# Patient Record
Sex: Male | Born: 1975 | Race: Black or African American | Hispanic: No | Marital: Married | Smoking: Former smoker
Health system: Southern US, Community
[De-identification: ages and names within clinical notes are randomized; demographics above are authoritative.]

## PROBLEM LIST (undated history)

## (undated) DIAGNOSIS — M1990 Unspecified osteoarthritis, unspecified site: Secondary | ICD-10-CM

## (undated) DIAGNOSIS — K589 Irritable bowel syndrome without diarrhea: Secondary | ICD-10-CM

## (undated) DIAGNOSIS — I10 Essential (primary) hypertension: Secondary | ICD-10-CM

## (undated) DIAGNOSIS — R42 Dizziness and giddiness: Secondary | ICD-10-CM

## (undated) DIAGNOSIS — K5792 Diverticulitis of intestine, part unspecified, without perforation or abscess without bleeding: Secondary | ICD-10-CM

## (undated) DIAGNOSIS — Z973 Presence of spectacles and contact lenses: Secondary | ICD-10-CM

## (undated) DIAGNOSIS — N200 Calculus of kidney: Secondary | ICD-10-CM

## (undated) HISTORY — PX: KIDNEY STONE SURGERY: SHX686

## (undated) HISTORY — PX: INGUINAL HERNIA REPAIR: SUR1180

---

## 2004-06-16 ENCOUNTER — Emergency Department: Payer: Self-pay | Admitting: Emergency Medicine

## 2006-11-30 ENCOUNTER — Emergency Department: Payer: Self-pay | Admitting: Emergency Medicine

## 2007-08-17 ENCOUNTER — Emergency Department: Payer: Self-pay | Admitting: Emergency Medicine

## 2009-03-10 ENCOUNTER — Emergency Department: Payer: Self-pay | Admitting: Emergency Medicine

## 2009-05-27 ENCOUNTER — Emergency Department: Payer: Self-pay | Admitting: Emergency Medicine

## 2009-09-21 ENCOUNTER — Emergency Department: Payer: Self-pay | Admitting: Emergency Medicine

## 2011-10-13 ENCOUNTER — Emergency Department: Payer: Self-pay | Admitting: *Deleted

## 2011-10-13 LAB — URINALYSIS, COMPLETE
Bilirubin,UR: NEGATIVE
Ketone: NEGATIVE
Ph: 6 (ref 4.5–8.0)
RBC,UR: 704 /HPF (ref 0–5)
Specific Gravity: 1.02 (ref 1.003–1.030)
Squamous Epithelial: <1
WBC UR: 8 /HPF (ref 0–5)

## 2011-10-13 LAB — CBC
HGB: 14.4 g/dL (ref 13.0–18.0)
MCHC: 32.7 g/dL (ref 32.0–36.0)
MCV: 87 fL (ref 80–100)
RBC: 5.09 10*6/uL (ref 4.40–5.90)
RDW: 13.6 % (ref 11.5–14.5)
WBC: 9 10*3/uL (ref 3.8–10.6)

## 2011-10-13 LAB — BASIC METABOLIC PANEL
Anion Gap: 9 (ref 7–16)
BUN: 16 mg/dL (ref 7–18)
Calcium, Total: 9.1 mg/dL (ref 8.5–10.1)
Chloride: 103 mmol/L (ref 98–107)
Co2: 28 mmol/L (ref 21–32)
Creatinine: 1.13 mg/dL (ref 0.60–1.30)
EGFR (African American): >60
Glucose: 111 mg/dL — ABNORMAL HIGH (ref 65–99)
Osmolality: 281 (ref 275–301)
Potassium: 4.2 mmol/L (ref 3.5–5.1)

## 2011-10-23 ENCOUNTER — Ambulatory Visit: Payer: Self-pay | Admitting: Urology

## 2011-10-29 ENCOUNTER — Ambulatory Visit: Payer: Self-pay | Admitting: Urology

## 2011-11-01 ENCOUNTER — Emergency Department: Payer: Self-pay | Admitting: Unknown Physician Specialty

## 2011-11-01 LAB — CBC
MCH: 28.9 pg (ref 26.0–34.0)
MCV: 85 fL (ref 80–100)
RBC: 5.19 10*6/uL (ref 4.40–5.90)
WBC: 16.1 10*3/uL — ABNORMAL HIGH (ref 3.8–10.6)

## 2011-11-01 LAB — URINALYSIS, COMPLETE
Bacteria: NONE SEEN
Bilirubin,UR: NEGATIVE
Glucose,UR: NEGATIVE mg/dL (ref 0–75)
Leukocyte Esterase: NEGATIVE
Nitrite: NEGATIVE
Ph: 6 (ref 4.5–8.0)
Specific Gravity: 1.023 (ref 1.003–1.030)
Squamous Epithelial: <1
WBC UR: 4 /HPF (ref 0–5)

## 2011-11-01 LAB — BASIC METABOLIC PANEL
Anion Gap: 10 (ref 7–16)
Calcium, Total: 9.4 mg/dL (ref 8.5–10.1)
Co2: 28 mmol/L (ref 21–32)
EGFR (African American): >60
EGFR (Non-African Amer.): 53 — ABNORMAL LOW
Glucose: 150 mg/dL — ABNORMAL HIGH (ref 65–99)
Osmolality: 277 (ref 275–301)
Potassium: 4.1 mmol/L (ref 3.5–5.1)
Sodium: 136 mmol/L (ref 136–145)

## 2011-11-16 ENCOUNTER — Ambulatory Visit: Payer: Self-pay | Admitting: Urology

## 2011-11-17 ENCOUNTER — Ambulatory Visit: Payer: Self-pay | Admitting: Urology

## 2011-11-17 DIAGNOSIS — I10 Essential (primary) hypertension: Secondary | ICD-10-CM

## 2011-11-17 LAB — POTASSIUM: Potassium: 3.5 mmol/L (ref 3.5–5.1)

## 2011-11-24 ENCOUNTER — Ambulatory Visit: Payer: Self-pay | Admitting: Urology

## 2011-12-08 DIAGNOSIS — N23 Unspecified renal colic: Secondary | ICD-10-CM | POA: Insufficient documentation

## 2011-12-08 DIAGNOSIS — N1330 Unspecified hydronephrosis: Secondary | ICD-10-CM | POA: Insufficient documentation

## 2011-12-08 DIAGNOSIS — N201 Calculus of ureter: Secondary | ICD-10-CM | POA: Insufficient documentation

## 2013-08-15 ENCOUNTER — Emergency Department: Payer: Self-pay | Admitting: Emergency Medicine

## 2013-08-15 LAB — COMPREHENSIVE METABOLIC PANEL
ALBUMIN: 4 g/dL (ref 3.4–5.0)
ALK PHOS: 124 U/L — AB
ALT: 30 U/L (ref 12–78)
AST: 28 U/L (ref 15–37)
Anion Gap: 6 — ABNORMAL LOW (ref 7–16)
BUN: 11 mg/dL (ref 7–18)
Bilirubin,Total: 0.8 mg/dL (ref 0.2–1.0)
CALCIUM: 9.2 mg/dL (ref 8.5–10.1)
CREATININE: 1.17 mg/dL (ref 0.60–1.30)
Chloride: 106 mmol/L (ref 98–107)
Co2: 26 mmol/L (ref 21–32)
EGFR (African American): >60
EGFR (Non-African Amer.): >60
GLUCOSE: 125 mg/dL — AB (ref 65–99)
Osmolality: 277 (ref 275–301)
Potassium: 4 mmol/L (ref 3.5–5.1)
Sodium: 138 mmol/L (ref 136–145)
Total Protein: 8.1 g/dL (ref 6.4–8.2)

## 2013-08-15 LAB — URINALYSIS, COMPLETE
BACTERIA: NONE SEEN
Bilirubin,UR: NEGATIVE
Glucose,UR: NEGATIVE mg/dL (ref 0–75)
Hyaline Cast: 5
Ketone: NEGATIVE
LEUKOCYTE ESTERASE: NEGATIVE
NITRITE: NEGATIVE
PH: 6 (ref 4.5–8.0)
PROTEIN: NEGATIVE
Specific Gravity: 1.02 (ref 1.003–1.030)
Squamous Epithelial: NONE SEEN
WBC UR: <1 /HPF (ref 0–5)

## 2013-08-15 LAB — CBC WITH DIFFERENTIAL/PLATELET
Basophil #: 0.1 10*3/uL (ref 0.0–0.1)
Basophil %: 0.6 %
Eosinophil #: 0.1 10*3/uL (ref 0.0–0.7)
Eosinophil %: 0.5 %
HCT: 44.6 % (ref 40.0–52.0)
HGB: 14.8 g/dL (ref 13.0–18.0)
LYMPHS ABS: 2.2 10*3/uL (ref 1.0–3.6)
Lymphocyte %: 17.3 %
MCH: 28.2 pg (ref 26.0–34.0)
MCHC: 33.1 g/dL (ref 32.0–36.0)
MCV: 85 fL (ref 80–100)
Monocyte #: 0.7 x10 3/mm (ref 0.2–1.0)
Monocyte %: 5.6 %
NEUTROS ABS: 9.6 10*3/uL — AB (ref 1.4–6.5)
NEUTROS PCT: 76 %
Platelet: 237 10*3/uL (ref 150–440)
RBC: 5.23 10*6/uL (ref 4.40–5.90)
RDW: 14.1 % (ref 11.5–14.5)
WBC: 12.6 10*3/uL — AB (ref 3.8–10.6)

## 2013-08-22 ENCOUNTER — Emergency Department: Payer: Self-pay | Admitting: Emergency Medicine

## 2013-08-22 LAB — CBC WITH DIFFERENTIAL/PLATELET
BASOS ABS: 0.2 10*3/uL — AB (ref 0.0–0.1)
BASOS PCT: 1.5 %
Eosinophil #: 0.1 10*3/uL (ref 0.0–0.7)
Eosinophil %: 0.8 %
HCT: 52.5 % — AB (ref 40.0–52.0)
HGB: 17.5 g/dL (ref 13.0–18.0)
LYMPHS PCT: 25.4 %
Lymphocyte #: 3.8 10*3/uL — ABNORMAL HIGH (ref 1.0–3.6)
MCH: 28.5 pg (ref 26.0–34.0)
MCHC: 33.3 g/dL (ref 32.0–36.0)
MCV: 86 fL (ref 80–100)
Monocyte #: 1.2 x10 3/mm — ABNORMAL HIGH (ref 0.2–1.0)
Monocyte %: 7.8 %
Neutrophil #: 9.5 10*3/uL — ABNORMAL HIGH (ref 1.4–6.5)
Neutrophil %: 64.5 %
Platelet: 370 10*3/uL (ref 150–440)
RBC: 6.13 10*6/uL — ABNORMAL HIGH (ref 4.40–5.90)
RDW: 13.6 % (ref 11.5–14.5)
WBC: 14.7 10*3/uL — AB (ref 3.8–10.6)

## 2013-08-22 LAB — COMPREHENSIVE METABOLIC PANEL
ALT: 26 U/L (ref 12–78)
ANION GAP: 7 (ref 7–16)
Albumin: 4.4 g/dL (ref 3.4–5.0)
Alkaline Phosphatase: 118 U/L — ABNORMAL HIGH
BUN: 23 mg/dL — ABNORMAL HIGH (ref 7–18)
Bilirubin,Total: 1.1 mg/dL — ABNORMAL HIGH (ref 0.2–1.0)
CHLORIDE: 97 mmol/L — AB (ref 98–107)
Calcium, Total: 9.9 mg/dL (ref 8.5–10.1)
Co2: 27 mmol/L (ref 21–32)
Creatinine: 1 mg/dL (ref 0.60–1.30)
EGFR (African American): >60
EGFR (Non-African Amer.): >60
Glucose: 128 mg/dL — ABNORMAL HIGH (ref 65–99)
OSMOLALITY: 268 (ref 275–301)
POTASSIUM: 3.3 mmol/L — AB (ref 3.5–5.1)
SGOT(AST): 16 U/L (ref 15–37)
SODIUM: 131 mmol/L — AB (ref 136–145)
TOTAL PROTEIN: 9.4 g/dL — AB (ref 6.4–8.2)

## 2013-08-22 LAB — URINALYSIS, COMPLETE
BILIRUBIN, UR: NEGATIVE
Bacteria: NONE SEEN
Glucose,UR: NEGATIVE mg/dL (ref 0–75)
Hyaline Cast: 18
Leukocyte Esterase: NEGATIVE
Nitrite: NEGATIVE
Ph: 5 (ref 4.5–8.0)
Protein: NEGATIVE
RBC,UR: 1 /HPF (ref 0–5)
Specific Gravity: 1.027 (ref 1.003–1.030)
Squamous Epithelial: NONE SEEN
WBC UR: 1 /HPF (ref 0–5)

## 2013-08-22 LAB — LIPASE, BLOOD: Lipase: 88 U/L (ref 73–393)

## 2013-08-31 ENCOUNTER — Inpatient Hospital Stay: Payer: Self-pay | Admitting: Internal Medicine

## 2013-08-31 LAB — CBC WITH DIFFERENTIAL/PLATELET
BASOS PCT: 2 %
Basophil #: 0.2 10*3/uL — ABNORMAL HIGH (ref 0.0–0.1)
Eosinophil #: 0.1 10*3/uL (ref 0.0–0.7)
Eosinophil %: 0.6 %
HCT: 48.1 % (ref 40.0–52.0)
HGB: 16.2 g/dL (ref 13.0–18.0)
LYMPHS ABS: 3 10*3/uL (ref 1.0–3.6)
Lymphocyte %: 25.3 %
MCH: 28.4 pg (ref 26.0–34.0)
MCHC: 33.7 g/dL (ref 32.0–36.0)
MCV: 84 fL (ref 80–100)
MONO ABS: 1 x10 3/mm (ref 0.2–1.0)
Monocyte %: 8.2 %
NEUTROS ABS: 7.6 10*3/uL — AB (ref 1.4–6.5)
NEUTROS PCT: 63.9 %
Platelet: 380 10*3/uL (ref 150–440)
RBC: 5.73 10*6/uL (ref 4.40–5.90)
RDW: 13 % (ref 11.5–14.5)
WBC: 12 10*3/uL — AB (ref 3.8–10.6)

## 2013-08-31 LAB — URINALYSIS, COMPLETE
BILIRUBIN, UR: NEGATIVE
Bacteria: NONE SEEN
Blood: NEGATIVE
GLUCOSE, UR: NEGATIVE mg/dL (ref 0–75)
Hyaline Cast: 48
Leukocyte Esterase: NEGATIVE
NITRITE: NEGATIVE
Ph: 5 (ref 4.5–8.0)
Protein: 30
RBC,UR: 2 /HPF (ref 0–5)
Specific Gravity: 1.017 (ref 1.003–1.030)
Squamous Epithelial: <1

## 2013-08-31 LAB — COMPREHENSIVE METABOLIC PANEL
ALBUMIN: 4.2 g/dL (ref 3.4–5.0)
ALK PHOS: 105 U/L
Anion Gap: 12 (ref 7–16)
BUN: 32 mg/dL — AB (ref 7–18)
Bilirubin,Total: 1.4 mg/dL — ABNORMAL HIGH (ref 0.2–1.0)
CALCIUM: 9.8 mg/dL (ref 8.5–10.1)
CREATININE: 2.19 mg/dL — AB (ref 0.60–1.30)
Chloride: 90 mmol/L — ABNORMAL LOW (ref 98–107)
Co2: 27 mmol/L (ref 21–32)
EGFR (African American): 43 — ABNORMAL LOW
EGFR (Non-African Amer.): 37 — ABNORMAL LOW
Glucose: 131 mg/dL — ABNORMAL HIGH (ref 65–99)
OSMOLALITY: 268 (ref 275–301)
POTASSIUM: 3.1 mmol/L — AB (ref 3.5–5.1)
SGOT(AST): 19 U/L (ref 15–37)
SGPT (ALT): 40 U/L (ref 12–78)
Sodium: 129 mmol/L — ABNORMAL LOW (ref 136–145)
Total Protein: 8.1 g/dL (ref 6.4–8.2)

## 2013-08-31 LAB — LIPASE, BLOOD: Lipase: 122 U/L (ref 73–393)

## 2013-08-31 LAB — MAGNESIUM: Magnesium: 2.4 mg/dL

## 2013-09-01 LAB — CBC WITH DIFFERENTIAL/PLATELET
BASOS ABS: 0.1 10*3/uL (ref 0.0–0.1)
BASOS PCT: 0.7 %
Eosinophil #: 0.1 10*3/uL (ref 0.0–0.7)
Eosinophil %: 1.1 %
HCT: 39.3 % — ABNORMAL LOW (ref 40.0–52.0)
HGB: 13.3 g/dL (ref 13.0–18.0)
LYMPHS ABS: 4.2 10*3/uL — AB (ref 1.0–3.6)
LYMPHS PCT: 35.8 %
MCH: 28.7 pg (ref 26.0–34.0)
MCHC: 33.8 g/dL (ref 32.0–36.0)
MCV: 85 fL (ref 80–100)
Monocyte #: 1 x10 3/mm (ref 0.2–1.0)
Monocyte %: 8.8 %
NEUTROS PCT: 53.6 %
Neutrophil #: 6.3 10*3/uL (ref 1.4–6.5)
Platelet: 288 10*3/uL (ref 150–440)
RBC: 4.64 10*6/uL (ref 4.40–5.90)
RDW: 12.9 % (ref 11.5–14.5)
WBC: 11.7 10*3/uL — ABNORMAL HIGH (ref 3.8–10.6)

## 2013-09-01 LAB — BASIC METABOLIC PANEL
Anion Gap: 7 (ref 7–16)
BUN: 28 mg/dL — ABNORMAL HIGH (ref 7–18)
CREATININE: 1.52 mg/dL — AB (ref 0.60–1.30)
Calcium, Total: 8.4 mg/dL — ABNORMAL LOW (ref 8.5–10.1)
Chloride: 97 mmol/L — ABNORMAL LOW (ref 98–107)
Co2: 28 mmol/L (ref 21–32)
GFR CALC NON AF AMER: 58 — AB
Glucose: 88 mg/dL (ref 65–99)
OSMOLALITY: 269 (ref 275–301)
POTASSIUM: 3 mmol/L — AB (ref 3.5–5.1)
SODIUM: 132 mmol/L — AB (ref 136–145)

## 2013-09-01 LAB — TSH: Thyroid Stimulating Horm: 1.32 u[IU]/mL

## 2013-09-02 LAB — CBC WITH DIFFERENTIAL/PLATELET
Basophil #: 0.1 10*3/uL (ref 0.0–0.1)
Basophil %: 1.1 %
EOS ABS: 0.1 10*3/uL (ref 0.0–0.7)
EOS PCT: 1.1 %
HCT: 35.8 % — ABNORMAL LOW (ref 40.0–52.0)
HGB: 11.9 g/dL — AB (ref 13.0–18.0)
LYMPHS PCT: 38.3 %
Lymphocyte #: 3.4 10*3/uL (ref 1.0–3.6)
MCH: 28.2 pg (ref 26.0–34.0)
MCHC: 33.3 g/dL (ref 32.0–36.0)
MCV: 85 fL (ref 80–100)
MONO ABS: 0.7 x10 3/mm (ref 0.2–1.0)
Monocyte %: 7.5 %
NEUTROS ABS: 4.6 10*3/uL (ref 1.4–6.5)
Neutrophil %: 52 %
PLATELETS: 282 10*3/uL (ref 150–440)
RBC: 4.23 10*6/uL — AB (ref 4.40–5.90)
RDW: 12.8 % (ref 11.5–14.5)
WBC: 8.9 10*3/uL (ref 3.8–10.6)

## 2013-09-02 LAB — BASIC METABOLIC PANEL
Anion Gap: 4 — ABNORMAL LOW (ref 7–16)
BUN: 18 mg/dL (ref 7–18)
CHLORIDE: 109 mmol/L — AB (ref 98–107)
CREATININE: 1.11 mg/dL (ref 0.60–1.30)
Calcium, Total: 8.4 mg/dL — ABNORMAL LOW (ref 8.5–10.1)
Co2: 24 mmol/L (ref 21–32)
EGFR (African American): >60
Glucose: 71 mg/dL (ref 65–99)
Osmolality: 274 (ref 275–301)
Potassium: 3.6 mmol/L (ref 3.5–5.1)
SODIUM: 137 mmol/L (ref 136–145)

## 2013-09-03 LAB — COMPREHENSIVE METABOLIC PANEL
ALBUMIN: 3.1 g/dL — AB (ref 3.4–5.0)
ALT: 30 U/L (ref 12–78)
AST: 13 U/L — AB (ref 15–37)
Alkaline Phosphatase: 71 U/L
Anion Gap: 6 — ABNORMAL LOW (ref 7–16)
BILIRUBIN TOTAL: 0.9 mg/dL (ref 0.2–1.0)
BUN: 8 mg/dL (ref 7–18)
Calcium, Total: 8.3 mg/dL — ABNORMAL LOW (ref 8.5–10.1)
Chloride: 104 mmol/L (ref 98–107)
Co2: 26 mmol/L (ref 21–32)
Creatinine: 0.9 mg/dL (ref 0.60–1.30)
EGFR (African American): >60
Glucose: 83 mg/dL (ref 65–99)
Osmolality: 269 (ref 275–301)
Potassium: 3.3 mmol/L — ABNORMAL LOW (ref 3.5–5.1)
Sodium: 136 mmol/L (ref 136–145)
TOTAL PROTEIN: 5.7 g/dL — AB (ref 6.4–8.2)

## 2013-09-03 LAB — CBC WITH DIFFERENTIAL/PLATELET
BASOS ABS: 0.1 10*3/uL (ref 0.0–0.1)
Basophil %: 1 %
EOS PCT: 1.7 %
Eosinophil #: 0.1 10*3/uL (ref 0.0–0.7)
HCT: 36.7 % — ABNORMAL LOW (ref 40.0–52.0)
HGB: 12.3 g/dL — ABNORMAL LOW (ref 13.0–18.0)
LYMPHS ABS: 3.8 10*3/uL — AB (ref 1.0–3.6)
LYMPHS PCT: 49.9 %
MCH: 28.5 pg (ref 26.0–34.0)
MCHC: 33.4 g/dL (ref 32.0–36.0)
MCV: 85 fL (ref 80–100)
MONO ABS: 0.5 x10 3/mm (ref 0.2–1.0)
Monocyte %: 7.2 %
Neutrophil #: 3.1 10*3/uL (ref 1.4–6.5)
Neutrophil %: 40.2 %
PLATELETS: 285 10*3/uL (ref 150–440)
RBC: 4.3 10*6/uL — AB (ref 4.40–5.90)
RDW: 13 % (ref 11.5–14.5)
WBC: 7.6 10*3/uL (ref 3.8–10.6)

## 2013-09-04 LAB — CBC WITH DIFFERENTIAL/PLATELET
BASOS ABS: 0.1 10*3/uL (ref 0.0–0.1)
Basophil %: 0.9 %
EOS PCT: 2 %
Eosinophil #: 0.2 10*3/uL (ref 0.0–0.7)
HCT: 36 % — ABNORMAL LOW (ref 40.0–52.0)
HGB: 11.9 g/dL — ABNORMAL LOW (ref 13.0–18.0)
Lymphocyte #: 3.7 10*3/uL — ABNORMAL HIGH (ref 1.0–3.6)
Lymphocyte %: 45.3 %
MCH: 28.1 pg (ref 26.0–34.0)
MCHC: 33.2 g/dL (ref 32.0–36.0)
MCV: 85 fL (ref 80–100)
MONOS PCT: 6.9 %
Monocyte #: 0.6 x10 3/mm (ref 0.2–1.0)
NEUTROS ABS: 3.7 10*3/uL (ref 1.4–6.5)
NEUTROS PCT: 44.9 %
Platelet: 285 10*3/uL (ref 150–440)
RBC: 4.24 10*6/uL — ABNORMAL LOW (ref 4.40–5.90)
RDW: 12.8 % (ref 11.5–14.5)
WBC: 8.2 10*3/uL (ref 3.8–10.6)

## 2013-09-04 LAB — BASIC METABOLIC PANEL
ANION GAP: 6 — AB (ref 7–16)
BUN: 9 mg/dL (ref 7–18)
CALCIUM: 8.5 mg/dL (ref 8.5–10.1)
CHLORIDE: 107 mmol/L (ref 98–107)
CREATININE: 1.04 mg/dL (ref 0.60–1.30)
Co2: 26 mmol/L (ref 21–32)
EGFR (African American): >60
EGFR (Non-African Amer.): >60
Glucose: 119 mg/dL — ABNORMAL HIGH (ref 65–99)
Osmolality: 277 (ref 275–301)
POTASSIUM: 3.6 mmol/L (ref 3.5–5.1)
SODIUM: 139 mmol/L (ref 136–145)

## 2013-11-13 ENCOUNTER — Emergency Department: Payer: Self-pay | Admitting: Student

## 2014-04-02 ENCOUNTER — Emergency Department: Payer: Self-pay | Admitting: Emergency Medicine

## 2014-04-02 LAB — COMPREHENSIVE METABOLIC PANEL
AST: 25 U/L (ref 15–37)
Albumin: 4.1 g/dL (ref 3.4–5.0)
Alkaline Phosphatase: 128 U/L — ABNORMAL HIGH
Anion Gap: 6 — ABNORMAL LOW (ref 7–16)
BUN: 15 mg/dL (ref 7–18)
Bilirubin,Total: 1 mg/dL (ref 0.2–1.0)
Calcium, Total: 9.3 mg/dL (ref 8.5–10.1)
Chloride: 102 mmol/L (ref 98–107)
Co2: 26 mmol/L (ref 21–32)
Creatinine: 1.13 mg/dL (ref 0.60–1.30)
EGFR (African American): >60
EGFR (Non-African Amer.): >60
Glucose: 98 mg/dL (ref 65–99)
Osmolality: 269 (ref 275–301)
POTASSIUM: 3.6 mmol/L (ref 3.5–5.1)
SGPT (ALT): 37 U/L
Sodium: 134 mmol/L — ABNORMAL LOW (ref 136–145)
Total Protein: 8.6 g/dL — ABNORMAL HIGH (ref 6.4–8.2)

## 2014-04-02 LAB — CBC WITH DIFFERENTIAL/PLATELET
BASOS PCT: 1 %
Basophil #: 0.1 10*3/uL (ref 0.0–0.1)
EOS ABS: 0.1 10*3/uL (ref 0.0–0.7)
Eosinophil %: 0.6 %
HCT: 47.2 % (ref 40.0–52.0)
HGB: 15.5 g/dL (ref 13.0–18.0)
Lymphocyte #: 3.2 10*3/uL (ref 1.0–3.6)
Lymphocyte %: 32 %
MCH: 28.4 pg (ref 26.0–34.0)
MCHC: 32.8 g/dL (ref 32.0–36.0)
MCV: 87 fL (ref 80–100)
Monocyte #: 0.6 x10 3/mm (ref 0.2–1.0)
Monocyte %: 6.2 %
NEUTROS ABS: 6 10*3/uL (ref 1.4–6.5)
Neutrophil %: 60.2 %
PLATELETS: 257 10*3/uL (ref 150–440)
RBC: 5.45 10*6/uL (ref 4.40–5.90)
RDW: 13.7 % (ref 11.5–14.5)
WBC: 9.9 10*3/uL (ref 3.8–10.6)

## 2014-04-02 LAB — URINALYSIS, COMPLETE
BILIRUBIN, UR: NEGATIVE
Bacteria: NONE SEEN
Blood: NEGATIVE
Glucose,UR: NEGATIVE mg/dL (ref 0–75)
Ketone: NEGATIVE
Leukocyte Esterase: NEGATIVE
Nitrite: NEGATIVE
Ph: 6 (ref 4.5–8.0)
Protein: NEGATIVE
Specific Gravity: 1.018 (ref 1.003–1.030)
Squamous Epithelial: NONE SEEN

## 2014-07-23 ENCOUNTER — Emergency Department
Admit: 2014-07-23 | Disposition: A | Discharge status: Home / Self Care | Payer: Self-pay | Admitting: Emergency Medicine

## 2014-07-23 LAB — CBC WITH DIFFERENTIAL/PLATELET
BASOS PCT: 0.8 %
Basophil #: 0.2 10*3/uL — ABNORMAL HIGH (ref 0.0–0.1)
EOS ABS: 0 10*3/uL (ref 0.0–0.7)
Eosinophil %: 0 %
HCT: 46.3 % (ref 40.0–52.0)
HGB: 15 g/dL (ref 13.0–18.0)
LYMPHS ABS: 0.9 10*3/uL — AB (ref 1.0–3.6)
Lymphocyte %: 4.1 %
MCH: 28.3 pg (ref 26.0–34.0)
MCHC: 32.5 g/dL (ref 32.0–36.0)
MCV: 87 fL (ref 80–100)
MONO ABS: 0.8 x10 3/mm (ref 0.2–1.0)
Monocyte %: 3.6 %
NEUTROS ABS: 19.6 10*3/uL — AB (ref 1.4–6.5)
Neutrophil %: 91.5 %
Platelet: 226 10*3/uL (ref 150–440)
RBC: 5.31 10*6/uL (ref 4.40–5.90)
RDW: 14.4 % (ref 11.5–14.5)
WBC: 21.4 10*3/uL — ABNORMAL HIGH (ref 3.8–10.6)

## 2014-07-23 LAB — COMPREHENSIVE METABOLIC PANEL
ALBUMIN: 4.5 g/dL
ALK PHOS: 83 U/L
ALT: 31 U/L
AST: 29 U/L
Anion Gap: 11 (ref 7–16)
BUN: 18 mg/dL
Bilirubin,Total: 1.5 mg/dL — ABNORMAL HIGH
CALCIUM: 8.9 mg/dL
Chloride: 101 mmol/L
Co2: 24 mmol/L
Creatinine: 0.93 mg/dL
EGFR (African American): >60
EGFR (Non-African Amer.): >60
Glucose: 173 mg/dL — ABNORMAL HIGH
Potassium: 4.1 mmol/L
Sodium: 136 mmol/L
Total Protein: 7.9 g/dL

## 2014-07-23 LAB — URINALYSIS, COMPLETE
Bacteria: NONE SEEN
Bilirubin,UR: NEGATIVE
Glucose,UR: 50 mg/dL (ref 0–75)
KETONE: NEGATIVE
Leukocyte Esterase: NEGATIVE
Nitrite: NEGATIVE
PH: 6 (ref 4.5–8.0)
Protein: 30
Specific Gravity: 1.026 (ref 1.003–1.030)

## 2014-07-23 LAB — LIPASE, BLOOD: Lipase: 34 U/L

## 2014-07-24 NOTE — Op Note (Signed)
PATIENT NAME:  Edward Harris, Edward Harris MR#:  [id number] DATE OF BIRTH:  07-06-1975  DATE OF PROCEDURE:  11/24/2011  PREOPERATIVE DIAGNOSIS: Left ureterolithiasis.   POSTOPERATIVE DIAGNOSIS: Left ureterolithiasis.   PROCEDURE PERFORMED: Left ureteroscopy with holmium laser lithotripsy.   SURGEON: Denice Bors. Jacqlyn Larsen, MD   ANESTHESIA: General endotracheal anesthesia.   INDICATIONS: The patient is a 39 year old African American gentleman who presented recently with a left proximal ureteral calculus. He underwent subsequent ESWL. There was failure of fragmentation or progression of the stone. He presents for ureteroscopic stone removal.   PROCEDURE: After informed consent was obtained, the patient was taken to the operating room and placed in the dorsal lithotomy position under general endotracheal anesthesia. The patient was then prepped and draped in the usual standard fashion. The 95 French rigid cystoscope was introduced into the urethra under direct vision with no urethral abnormalities noted. Upon entering the prostatic fossa, minimal bilobar hypertrophy was appreciated. Upon entering the bladder, the mucosa was inspected in its entirety with no gross mucosal lesions noted. Bilateral ureteral orifices were well visualized with no lesions noted. A flexible tip Glidewire was introduced into the left ureteral orifice. It was advanced into the upper pole collecting system under fluoroscopic guidance without difficulty. The cystoscope was removed. The 6 French rigid ureteroscope was advanced into the urinary bladder. A second guidewire was utilized to help facilitate navigation of the scope into the ureteral orifice. The scope was advanced to the level of the stone in the proximal ureter. The stone was then fragmented with the holmium laser. The stone was noted to be significantly hardened requiring increased energy and prolonged fragmentation for adequate breakage. Once the stone was adequately fragmented, the  basket was utilized for removal. Multiple passes were required for complete removal of an approximate 6 mm proximal ureteral calculus. The scope was then advanced into the level of the renal pelvis with no additional abnormalities appreciated. The decision was made not to place a stent. The ureteroscope was removed. The cystoscope was replaced back into the urinary bladder. The stone fragments were irrigated free. These were collected and will be sent for stone analysis. The bladder was then drained. The cystoscope was removed. The patient was returned to the supine position and awakened from laryngeal mask airway anesthesia. He was taken to the recovery room in stable condition. There were no problems or complications. The patient tolerated the procedure well.    ____________________________ Denice Bors. Jacqlyn Larsen, MD bsc:drc D: 11/24/2011 19:02:57 ET T: 11/25/2011 08:12:08 ET JOB#: [id number]  cc: Denice Bors. Jacqlyn Larsen, MD, <Dictator> Denice Bors Luken Shadowens MD ELECTRONICALLY SIGNED 11/26/2011 9:00

## 2014-07-28 NOTE — Discharge Summary (Signed)
Dates of Admission and Diagnosis:  Date of Admission 31-Aug-2013   Date of Discharge 01-Jan-0001   Admitting Diagnosis vomiting, dehydration, hypotension, acute renal failure, hypertension   Final Diagnosis vomiting, dehydration, hypotension, acute renal failure, hypertension   Discharge Diagnosis 1 hypertension    Chief Complaint/History of Present Illness Young male presented to ED with nausea and vomiting. Patient was found to be dehydrated and hypotensive. ED work up revealed and acute renal failure.   Allergies:  No Known Allergies:   Routine Chem:  28-May-15 14:33   BUN  32  Creatinine (comp)  2.19  Potassium, Serum  3.1  29-May-15 04:37   BUN  28  Creatinine (comp)  1.52  Potassium, Serum  3.0  30-May-15 04:23   BUN 18  Creatinine (comp) 1.11  Potassium, Serum 3.6  31-May-15 04:44   BUN 8  Creatinine (comp) 0.90  Potassium, Serum  3.3  01-Jun-15 04:29   Glucose, Serum  119  BUN 9  Creatinine (comp) 1.04  Sodium, Serum 139  Potassium, Serum 3.6  Chloride, Serum 107  CO2, Serum 26  Calcium (Total), Serum 8.5  Anion Gap  6  Osmolality (calc) 277  eGFR (African American) >60  eGFR (Non-African American) >60 (eGFR values <39mL/min/1.73 m2 may be an indication of chronic kidney disease (CKD). Calculated eGFR is useful in patients with stable renal function. The eGFR calculation will not be reliable in acutely ill patients when serum creatinine is changing rapidly. It is not useful in  patients on dialysis. The eGFR calculation may not be applicable to patients at the low and high extremes of body sizes, pregnant women, and vegetarians.)  Routine UA:  28-May-15 21:10   Color (UA) Amber  Clarity (UA) Cloudy  Glucose (UA) Negative  Bilirubin (UA) Negative  Ketones (UA) 1+  Specific Gravity (UA) 1.017  Blood (UA) Negative  pH (UA) 5.0  Protein (UA) 30 mg/dL  Nitrite (UA) Negative  Leukocyte Esterase (UA) Negative (Result(s) reported on 31 Aug 2013 at  09:50PM.)  RBC (UA) 2 /HPF  WBC (UA) 3 /HPF  Bacteria (UA) NONE SEEN  Epithelial Cells (UA) <1 /HPF  Mucous (UA) PRESENT  Hyaline Cast (UA) 48 /LPF (Result(s) reported on 31 Aug 2013 at 09:50PM.)  Routine Hem:  28-May-15 14:33   WBC (CBC)  12.0  Hemoglobin (CBC) 16.2  Hematocrit (CBC) 48.1  29-May-15 04:37   WBC (CBC)  11.7  Hemoglobin (CBC) 13.3  Hematocrit (CBC)  39.3  30-May-15 04:23   WBC (CBC) 8.9  Hemoglobin (CBC)  11.9  Hematocrit (CBC)  35.8  31-May-15 04:44   WBC (CBC) 7.6  Hemoglobin (CBC)  12.3  Hematocrit (CBC)  36.7  01-Jun-15 04:29   WBC (CBC) 8.2  RBC (CBC)  4.24  Hemoglobin (CBC)  11.9  Hematocrit (CBC)  36.0  Platelet Count (CBC) 285  MCV 85  MCH 28.1  MCHC 33.2  RDW 12.8  Neutrophil % 44.9  Lymphocyte % 45.3  Monocyte % 6.9  Eosinophil % 2.0  Basophil % 0.9  Neutrophil # 3.7  Lymphocyte #  3.7  Monocyte # 0.6  Eosinophil # 0.2  Basophil # 0.1 (Result(s) reported on 04 Sep 2013 at 05:09AM.)   PERTINENT RADIOLOGY STUDIES: XRay:    12-May-15 11:26, KUB - Kidney Ureter Bladder  KUB - Kidney Ureter Bladder   REASON FOR EXAM:    decrease BM and LLQ abd pain  COMMENTS:   LMP: (Male)    PROCEDURE: DXR - DXR KIDNEY URETER BLADDER  -  Aug 15 2013 11:26AM     CLINICAL DATA:  Two week history of left lower quadrant abdominal  pain    EXAM:  ABDOMEN - 1 VIEW    COMPARISON:  Prior KUB 11/16/2011    FINDINGS:  The bowel gas pattern is normal. No radio-opaque calculi or other  significant radiographic abnormality are seen. Surgical changes of  prior right inguinal hernia repair.     IMPRESSION:  Negative.      Electronically Signed    By: Jacqulynn Cadet M.D.    On: 08/15/2013 11:44         Verified By: Criselda Peaches, M.D.,  CT:    12-May-15 13:32, CT Abdomen and Pelvis With Contrast  CT Abdomen and Pelvis With Contrast   REASON FOR EXAM:    (1) LLQ abad pain with elevate WBC; (2) LLQ pain  COMMENTS:   LMP:  (Male)    PROCEDURE: CT  - CT ABDOMEN / PELVIS  W  - Aug 15 2013  1:32PM     CLINICAL DATA:  One week history of abdominal pain with vomiting  beginning today the patient is still complaining of left lower  quadrant abdominal discomfort and has an elevated white blood cell  count.    EXAM:  CT ABDOMEN AND PELVIS WITH CONTRAST    TECHNIQUE:  Multidetector CT imaging of the abdomen and pelvis was performed  usingthe standard protocol following bolus administration of  intravenous contrast.    CONTRAST:  125 cc of Isovue 370 intravenously. The patient also  received oral contrast material    COMPARISON:  DG ABDOMEN 1V dated 08/15/2013    FINDINGS:  The liver exhibits no focal mass nor ductal dilation. Mildly  decreased density within the liver may reflect fatty infiltrative  change. The gallbladder is adequately distended without definite  evidence of stones. No pericholecystic inflammatory change is  demonstrated. The pancreas, spleen, moderately distended stomach,  duodenum, adrenal glands, and kidneys are normal in appearance.  The jejunum and ileum demonstrate a normal appearance. Contrast has  reached the distal small bowel but has not yet reachedthe colon.  The colon appears incompletely rotated with the majority of the  colonic loops lying to the left of midline. A normal calibered,  gas-filled appendix is demonstrated. There is an abnormal appearance  of the descending colon and proximal rectosigmoid in a fashion  consistent with acute diverticulitis. There is no evidence of  perforation or abscess formation. Microperforation cannot be  absolutely excluded. Increased density in the pericolonic fat is  demonstrated. More distally the sigmoid colon and rectum appear  normal.    The urinary bladder and prostate gland appear normal. The patient  has undergone previous right inguinal hernia repair. There is a  small left inguinal hernia containing fat. There is no  umbilical  hernia. Thepsoas musculature is normal in appearance. The lumbar  vertebral bodies are preserved in height. There is degenerative disc  change at L4-5. The bony pelvis exhibits no acute abnormality. The  lung bases exhibit no acute abnormalities. There may be minimal  atelectasis versus scarring in the inferior aspect of the right  middle lobe.     IMPRESSION:  1. The appearance of the distal descending colon and proximal  rectosigmoid is consistent with acute diverticulitis. There is no  evidence of abscess formation. No free extraluminal gas collections  are demonstrated but microperforation cannot be excluded. The  remainder of the bowel exhibits no acute abnormality.  2. There  is no acute hepatobiliary abnormality nor acute urinary  tract abnormality.  Electronically Signed    By: David  Martinique    On: 08/15/2013 13:51         Verified By: DAVID A. Martinique, M.D., MD   Pertinent Past History:  Pertinent Past History Hypertension   Hospital Course:  [place] male admitted with nausea, vomiting, dehydration, hypotension and acute renal failure. Patient was recently treated for acute diverticulitis on 08/15/2013. Patient received Zofran prn for nausea. Nausea and Vomiting gradually improved and resolved. Dehydration and hypotension improved with i.v fluids. Acute renal failure, likely pre-renal and ATN, creatinine improved to within normal limits. Hypokalemia was corrected with potassium supplement. Mild leukocytosis on initial labs. Repeat WBC count was normal prior to discharge. HTN: advised to resume usual anti-hypertensive medications at discharge.   Condition on Discharge Stable   Code Status:  Code Status Full Code   DISCHARGE INSTRUCTIONS HOME MEDS:  Medication Reconciliation: Patient's Home Medications at Discharge:     Medication Instructions  ondansetron 4 mg oral tablet, disintegrating  1-2 tab(s) orally 3 times a day, As Needed - for Nausea,  Vomiting   hydrochlorothiazide-losartan 25 mg-100 mg oral tablet  1 tab(s) orally once a day   prilosec 20 mg oral delayed release capsule  1 cap(s) orally once a day    PRESCRIPTIONS: ELECTRONICALLY SUBMITTED   Physician's Instructions:  Diet Low Sodium  Low Fat, Low Cholesterol   Activity Limitations As tolerated   Return to Work on 09/06/2013   Time frame for Follow Up Appointment 1-2 weeks  Dr. Loyal Buba, III, John B(Family Physician): [facility], [address] [place], [place] [address], [place] [phone]  Electronic Signatures: Glendon Axe (MD)  (Signed 01-Jun-15 13:45)  Authored: ADMISSION DATE AND DIAGNOSIS, CHIEF COMPLAINT/HPI, Allergies, PERTINENT LABS, PERTINENT RADIOLOGY STUDIES, PERTINENT PAST HISTORY, HOSPITAL COURSE, [place], PATIENT INSTRUCTIONS, Follow Up Physician   Last Updated: 01-Jun-15 13:45 by Glendon Axe (MD)

## 2014-07-28 NOTE — H&P (Signed)
PATIENT NAME:  KHRISTIAN, PHILLIPPI MR#:  [id number] DATE OF BIRTH:  03/02/1976  DATE OF ADMISSION:  08/31/2013  PRIMARY CARE PHYSICIAN: Dr. Lisette Grinder.   REFERRING PHYSICIAN: Dr. Hinda Kehr.   CHIEF COMPLAINT: Nausea, vomiting.   HISTORY OF PRESENT ILLNESS: Mr. Trebilcock is a 39 year old male with a history of hypertension. Came to the Emergency Department on May 12th with complaints of left lower quadrant pain. The patient underwent a CT scan of the abdomen and pelvis and was found to have diverticulitis. The patient was given Cipro and Flagyl. The patient took about 5 days of antibiotics. Since the day of diagnosis, the patient started to have nausea, vomiting, unable to keep down any diet. The patient has been having nausea and vomiting all of these 2 weeks. After a week, the patient went to his primary care physician who gave him Phenergan; however, despite taking this Phenergan, the patient continued to have nausea and vomiting. Came back to the Emergency Department when the patient was told to start back the antibiotics and was also given Zofran without much improvement. The patient had a scheduled appointment with gastrointestinal disease. When the patient went to the GI clinic, the patient was found to have systolic blood pressure in the 70s. Concerning this, the patient was sent to the Emergency Department for further evaluation. Workup in the Emergency Department: The patient was found to have acute renal failure with a BUN of 32, creatinine of 2.19, potassium of 3.1. The patient states usually vomits 2 to 3 hours after eating. From the time of eating, the patient experiences severe nausea. Usually vomits what he has eaten, with improvement of the nausea after vomiting. Denies having any abdominal pain. Denies having any fever.   PAST MEDICAL HISTORY: Hypertension.   PAST SURGICAL HISTORY:  1. Kidney stones.  2. Hernia repair.   ALLERGIES: No known drug allergies.   HOME MEDICATIONS:   1. Prilosec 20 mg once a day.  2. Zofran 4 mg 3 times a day.  3. Losartan/hydrochlorothiazide 25/100 mg once a day.   SOCIAL HISTORY: Former smoker, quit about 3 years back. Denies drinking alcohol or using illicit drugs. Married, lives with his wife. Works as a Conservation officer, nature.    FAMILY HISTORY: Hypertension in mother.   REVIEW OF SYSTEMS:  CONSTITUTIONAL: Experiences generalized weakness.  EYES: No change in vision.  ENT: No change in hearing.  RESPIRATORY: No cough, shortness of breath.  CARDIOVASCULAR: No chest pain, palpations.  GASTROINTESTINAL: Has nausea and vomiting. Has been experiencing some diarrhea since has taken MiraLAX.  GENITOURINARY: No dysuria or hematuria.  HEMATOLOGIC: No easy bruising or bleeding.  SKIN: No rash or lesions.  MUSCULOSKELETAL: No joint pains and aches.  ENDOCRINE: No polyuria or polydipsia.   PHYSICAL EXAMINATION:  GENERAL: This is a well-built, well-nourished, age-appropriate male lying down in the bed, not in distress.  VITAL SIGNS: Temperature 98.7, pulse 85, blood pressure 121/63, respiratory rate of 14, oxygen saturations 100% on room air.  HEENT: Head normocephalic, atraumatic. There is no scleral icterus. Conjunctivae normal. Pupils equal and react to light. Extraocular movements are intact. Mucous membranes: Mild dryness. No pharyngeal erythema.  NECK: Supple. No lymphadenopathy. No JVD. No carotid bruit. No thyromegaly.  CHEST: Has no focal tenderness.  LUNGS: Bilaterally clear to auscultation.  HEART: S1, S2 regular. No murmurs are heard.  ABDOMEN: Bowel sounds present. Soft, nontender, nondistended. No hepatosplenomegaly.  EXTREMITIES: No pedal edema. Pulses 2+.  SKIN: No rash or lesions.  MUSCULOSKELETAL:  Good range of motion in all of the extremities.  NEUROLOGIC: The patient is alert, oriented to place, person, and time. Cranial nerves II through XII intact. Motor and sensory .    LABORATORIES: CMP shows BUN 32,  creatinine of 2.19, potassium of 3.1. Total bilirubin 1.4.   CBC is completely within normal limits. Magnesium of 2.4.   ASSESSMENT AND PLAN: Mr. Gronau is a 39 year old male who comes to the Emergency Department. Was found to have low blood pressure.  1. Hypotension secondary to dehydration as well as the patient continued to take high blood pressure medications hydrochlorothiazide and losartan. Continue with intravenous fluids. The patient's blood pressure improved to systolic blood pressure to 120s.  2. Nausea and vomiting: This seems to be more from the gastric cause. Keep the patient n.p.o. Continue with antinausea medications. The patient does not have any tenderness consistent with gastritis. The patient does not have any elevated LFTs. Does not have any tenderness in any part of the body. Continue the Protonix and follow up.  3. Acute renal failure: Combination of the acute tubular necrosis and prerenal. Continue with intravenous fluids and follow up.  4. Diverticulitis: The patient does not have any tenderness. Will hold all antibiotics for now.  5. History of hypertension: Hold all blood pressure medications for now.  6. Keep the patient on deep vein thrombosis prophylaxis with Lovenox.   TIME SPENT: 50 minutes.    ____________________________ Monica Becton, MD pv:gb D: 08/31/2013 22:59:19 ET T: 09/01/2013 01:14:18 ET JOB#: [id number]  cc: Monica Becton, MD, <Dictator> John B. Sarina Ser, MD Grier Mitts Shirline Kendle MD ELECTRONICALLY SIGNED 09/04/2013 23:21

## 2014-08-19 ENCOUNTER — Emergency Department: Payer: Managed Care, Other (non HMO)

## 2014-08-19 ENCOUNTER — Encounter: Payer: Self-pay | Admitting: Emergency Medicine

## 2014-08-19 ENCOUNTER — Inpatient Hospital Stay
Admission: EM | Admit: 2014-08-19 | Discharge: 2014-08-22 | DRG: 392 | Disposition: A | Discharge status: Home / Self Care | Payer: Managed Care, Other (non HMO) | Attending: Internal Medicine | Admitting: Internal Medicine

## 2014-08-19 DIAGNOSIS — R198 Other specified symptoms and signs involving the digestive system and abdomen: Secondary | ICD-10-CM

## 2014-08-19 DIAGNOSIS — Z87442 Personal history of urinary calculi: Secondary | ICD-10-CM

## 2014-08-19 DIAGNOSIS — R1110 Vomiting, unspecified: Secondary | ICD-10-CM

## 2014-08-19 DIAGNOSIS — Z888 Allergy status to other drugs, medicaments and biological substances status: Secondary | ICD-10-CM

## 2014-08-19 DIAGNOSIS — K56609 Unspecified intestinal obstruction, unspecified as to partial versus complete obstruction: Secondary | ICD-10-CM | POA: Insufficient documentation

## 2014-08-19 DIAGNOSIS — K589 Irritable bowel syndrome without diarrhea: Secondary | ICD-10-CM | POA: Diagnosis present

## 2014-08-19 DIAGNOSIS — K567 Ileus, unspecified: Secondary | ICD-10-CM | POA: Diagnosis not present

## 2014-08-19 DIAGNOSIS — K5732 Diverticulitis of large intestine without perforation or abscess without bleeding: Secondary | ICD-10-CM | POA: Diagnosis not present

## 2014-08-19 DIAGNOSIS — I10 Essential (primary) hypertension: Secondary | ICD-10-CM | POA: Diagnosis present

## 2014-08-19 DIAGNOSIS — Z87891 Personal history of nicotine dependence: Secondary | ICD-10-CM

## 2014-08-19 DIAGNOSIS — K5792 Diverticulitis of intestine, part unspecified, without perforation or abscess without bleeding: Secondary | ICD-10-CM | POA: Diagnosis present

## 2014-08-19 HISTORY — DX: Essential (primary) hypertension: I10

## 2014-08-19 HISTORY — DX: Diverticulitis of intestine, part unspecified, without perforation or abscess without bleeding: K57.92

## 2014-08-19 HISTORY — DX: Calculus of kidney: N20.0

## 2014-08-19 HISTORY — DX: Irritable bowel syndrome, unspecified: K58.9

## 2014-08-19 LAB — CBC WITH DIFFERENTIAL/PLATELET
BASOS ABS: 0 10*3/uL (ref 0–0.1)
Basophils Relative: 1 %
EOS ABS: 0 10*3/uL (ref 0–0.7)
Eosinophils Relative: 0 %
HEMATOCRIT: 48.4 % (ref 40.0–52.0)
Hemoglobin: 15.6 g/dL (ref 13.0–18.0)
Lymphocytes Relative: 16 %
Lymphs Abs: 1.5 10*3/uL (ref 1.0–3.6)
MCH: 28.6 pg (ref 26.0–34.0)
MCHC: 32.1 g/dL (ref 32.0–36.0)
MCV: 89.1 fL (ref 80.0–100.0)
MONO ABS: 0.4 10*3/uL (ref 0.2–1.0)
MONOS PCT: 4 %
NEUTROS PCT: 79 %
Neutro Abs: 7.6 10*3/uL — ABNORMAL HIGH (ref 1.4–6.5)
Platelets: 185 10*3/uL (ref 150–440)
RBC: 5.44 MIL/uL (ref 4.40–5.90)
RDW: 13.4 % (ref 11.5–14.5)
WBC: 9.6 10*3/uL (ref 3.8–10.6)

## 2014-08-19 LAB — COMPREHENSIVE METABOLIC PANEL
ALT: 25 U/L (ref 17–63)
ANION GAP: 9 (ref 5–15)
AST: 20 U/L (ref 15–41)
Albumin: 4.2 g/dL (ref 3.5–5.0)
Alkaline Phosphatase: 75 U/L (ref 38–126)
BILIRUBIN TOTAL: 1.2 mg/dL (ref 0.3–1.2)
BUN: 10 mg/dL (ref 6–20)
CALCIUM: 8.7 mg/dL — AB (ref 8.9–10.3)
CHLORIDE: 104 mmol/L (ref 101–111)
CO2: 25 mmol/L (ref 22–32)
Creatinine, Ser: 0.76 mg/dL (ref 0.61–1.24)
GFR calc Af Amer: >60 mL/min (ref >60)
GFR calc non Af Amer: >60 mL/min (ref >60)
GLUCOSE: 112 mg/dL — AB (ref 65–99)
POTASSIUM: 3.9 mmol/L (ref 3.5–5.1)
SODIUM: 138 mmol/L (ref 135–145)
TOTAL PROTEIN: 7.5 g/dL (ref 6.5–8.1)

## 2014-08-19 LAB — LIPASE, BLOOD: Lipase: 38 U/L (ref 22–51)

## 2014-08-19 LAB — URINALYSIS COMPLETE WITH MICROSCOPIC (ARMC ONLY)
BACTERIA UA: NONE SEEN
BILIRUBIN URINE: NEGATIVE
Glucose, UA: NEGATIVE mg/dL
LEUKOCYTES UA: NEGATIVE
Nitrite: NEGATIVE
PROTEIN: NEGATIVE mg/dL
SPECIFIC GRAVITY, URINE: 1.01 (ref 1.005–1.030)
SQUAMOUS EPITHELIAL / LPF: NONE SEEN
pH: 8 (ref 5.0–8.0)

## 2014-08-19 MED ORDER — PROMETHAZINE HCL 25 MG/ML IJ SOLN
25.0000 mg | Freq: Once | INTRAMUSCULAR | Status: AC
  Administered 2014-08-19: 25 mg via INTRAVENOUS

## 2014-08-19 MED ORDER — ONDANSETRON HCL 4 MG/2ML IJ SOLN
4.0000 mg | Freq: Once | INTRAMUSCULAR | Status: AC
  Administered 2014-08-19: 4 mg via INTRAVENOUS

## 2014-08-19 MED ORDER — IOHEXOL 240 MG/ML SOLN
25.0000 mL | Freq: Once | INTRAMUSCULAR | Status: AC | PRN
  Administered 2014-08-19: 25 mL via ORAL

## 2014-08-19 MED ORDER — MORPHINE SULFATE 4 MG/ML IJ SOLN
4.0000 mg | Freq: Once | INTRAMUSCULAR | Status: AC
  Administered 2014-08-19: 4 mg via INTRAVENOUS

## 2014-08-19 MED ORDER — CIPROFLOXACIN IN D5W 400 MG/200ML IV SOLN
INTRAVENOUS | Status: AC
  Administered 2014-08-19: 400 mg via INTRAVENOUS
  Filled 2014-08-19: qty 200

## 2014-08-19 MED ORDER — METOCLOPRAMIDE HCL 5 MG/ML IJ SOLN
10.0000 mg | Freq: Once | INTRAMUSCULAR | Status: AC
  Administered 2014-08-19: 10 mg via INTRAVENOUS

## 2014-08-19 MED ORDER — MORPHINE SULFATE 4 MG/ML IJ SOLN
INTRAMUSCULAR | Status: AC
  Administered 2014-08-19: 4 mg via INTRAVENOUS
  Filled 2014-08-19: qty 1

## 2014-08-19 MED ORDER — SODIUM CHLORIDE 0.9 % IV SOLN: Freq: Once | INTRAVENOUS | Status: DC

## 2014-08-19 MED ORDER — METRONIDAZOLE IN NACL 5-0.79 MG/ML-% IV SOLN
500.0000 mg | Freq: Once | INTRAVENOUS | Status: AC
  Administered 2014-08-19: 500 mg via INTRAVENOUS

## 2014-08-19 MED ORDER — ONDANSETRON HCL 4 MG/2ML IJ SOLN
INTRAMUSCULAR | Status: AC
  Filled 2014-08-19: qty 2

## 2014-08-19 MED ORDER — SODIUM CHLORIDE 0.9 % IV BOLUS (SEPSIS)
1000.0000 mL | Freq: Once | INTRAVENOUS | Status: AC
  Administered 2014-08-19: 1000 mL via INTRAVENOUS

## 2014-08-19 MED ORDER — CIPROFLOXACIN IN D5W 400 MG/200ML IV SOLN
400.0000 mg | Freq: Once | INTRAVENOUS | Status: AC
  Administered 2014-08-19: 400 mg via INTRAVENOUS

## 2014-08-19 MED ORDER — PROMETHAZINE HCL 25 MG/ML IJ SOLN
INTRAMUSCULAR | Status: AC
Start: 2014-08-19 — End: 2014-08-19
  Administered 2014-08-19: 25 mg via INTRAVENOUS
  Filled 2014-08-19: qty 1

## 2014-08-19 MED ORDER — IOHEXOL 300 MG/ML  SOLN
100.0000 mL | Freq: Once | INTRAMUSCULAR | Status: AC | PRN
  Administered 2014-08-19: 100 mL via INTRAVENOUS

## 2014-08-19 MED ORDER — METRONIDAZOLE IN NACL 5-0.79 MG/ML-% IV SOLN
INTRAVENOUS | Status: AC
  Administered 2014-08-19: 500 mg via INTRAVENOUS
  Filled 2014-08-19: qty 100

## 2014-08-19 MED ORDER — METOCLOPRAMIDE HCL 5 MG/ML IJ SOLN
INTRAMUSCULAR | Status: AC
Start: 2014-08-19 — End: 2014-08-19
  Administered 2014-08-19: 10 mg via INTRAVENOUS
  Filled 2014-08-19: qty 2

## 2014-08-19 NOTE — ED Notes (Signed)
Notified by lab of need for redraw. Lab work obtained from IV. Discussed need for urine sample with patient. Patient denies need to void at this time. Encouraged patient to notify nurse when able to provide sample. Patient verbalized understanding. Patient reports relief from pain medicine but c/o nausea. ED MD notified.

## 2014-08-19 NOTE — ED Notes (Signed)
Contacted lab regarding pending labs. Lab tech agreed to look into it.

## 2014-08-19 NOTE — ED Notes (Signed)
C/o LLQ abd. Pain with n,v since Saturday, not able to have BM since Friday, hx of diverticulitis

## 2014-08-19 NOTE — ED Provider Notes (Signed)
[facility] Emergency Department Provider Note  Time seen: 8:40 PM  I have reviewed the triage vital signs and the nursing notes.   HISTORY  Chief Complaint Abdominal Pain; Nausea; and Emesis    HPI CORRADO HYMON is a 39 y.o. male with a past medical history of diverticulosis, diverticulitis, hypertension, diabetes who presents to the emergency department with left lower quadrant abdominal cramping, nausea and vomiting. According to the patient he has been having these symptoms for approximately 3 days. Describes the cramping as moderate in intensity mostly located in the left lower quadrant. Patient was recently diagnosed with diverticulitis 1 month ago requiring a brief hospitalization. He has a colonoscopy scheduled in 2-3 weeks.Patient states this feels similar to his past diverticulitis flare. Denies fever. Positive nausea and vomiting 3 days getting worse patient unable to tolerate liquids today. Last bowel movement approximately 2 days ago.     Past Medical History  Diagnosis Date  . Diverticulitis   . Hypertension     There are no active problems to display for this patient.   No past surgical history on file.  Current Outpatient Rx  Name  Route  Sig  Dispense  Refill  . losartan-hydrochlorothiazide (HYZAAR) 100-25 MG per tablet   Oral   Take 1 tablet by mouth daily.      3   . predniSONE (STERAPRED UNI-PAK 21 TAB) 10 MG (21) TBPK tablet      6 day taper pack           Allergies Review of patient's allergies indicates no known allergies.  No family history on file.  Social History History  Substance Use Topics  . Smoking status: Never Smoker   . Smokeless tobacco: Not on file  . Alcohol Use: Yes     Comment: "occassional"    Review of Systems Constitutional: Negative for fever. Cardiovascular: Negative for chest pain. Respiratory: Negative for shortness of breath. Gastrointestinal: Positive for left lower quadrant  abdominal pain, nausea, vomiting. Negative for diarrhea. Constipation 2-3 days. Genitourinary: Negative for dysuria.  10-point ROS otherwise negative.  ____________________________________________   PHYSICAL EXAM:  VITAL SIGNS: ED Triage Vitals  Enc Vitals Group     BP 08/19/14 1504 138/75 mmHg     Pulse Rate 08/19/14 1504 90     Resp 08/19/14 1504 18     Temp 08/19/14 1504 98.6 F (37 C)     Temp Source 08/19/14 1504 Oral     SpO2 08/19/14 1504 100 %     Weight 08/19/14 1504 220 lb (99.791 kg)     Height 08/19/14 1504 5\' 9"  (1.753 m)     Head Cir --      Peak Flow --      Pain Score 08/19/14 1505 10     Pain Loc --      Pain Edu? --      Excl. in [place]? --     Constitutional: Alert and oriented. Well appearing and in no distress. ENT   Mouth/Throat: Mucous membranes are moist. Cardiovascular: Normal rate, regular rhythm. No murmurs, rubs, or gallops. Respiratory: Normal respiratory effort without tachypnea nor retractions. Breath sounds are clear  Gastrointestinal: Soft, mild left lower quadrant tenderness to palpation. No rebound or guarding Musculoskeletal: Nontender with normal range of motion in all extremities. Neurologic:  Normal speech and language. No gross focal neurologic deficits Skin:  Skin is warm, dry and intact.  Psychiatric: Mood and affect are normal. Speech and behavior are normal.  ____________________________________________    RADIOLOGY  CT scan consistent with sigmoid diverticulitis with an ileus.  ____________________________________________    INITIAL IMPRESSION / ASSESSMENT AND PLAN / ED COURSE  Pertinent labs & imaging results that were available during my care of the patient were reviewed by me and considered in my medical decision making (see chart for details).  Patient left lower quadrant abdominal pain, no white blood cell count elevation. CT scan is consistent with sigmoid diverticulitis as well as an ileus. Patient  attempting to by mouth hydrate although becoming very nauseated unfortunately. If patient nothing by mouth tolerate well we'll discharge home with medication if not will admit for IV antibiotics until the patient can tolerate by mouth liquids.  ----------------------------------------- 8:54 PM on 08/19/2014 -----------------------------------------  Patient unable to tolerate liquids due to nausea/vomiting. Will admit for IV abx. ____________________________________________   FINAL CLINICAL IMPRESSION(S) / ED DIAGNOSES  Diverticulitis Ileus   Harvest Dark, MD 08/19/14 2055

## 2014-08-19 NOTE — H&P (Signed)
[facility] [place] NAME: Edward Harris    MR#:  [id number]  DATE OF BIRTH:  [birth year]  DATE OF ADMISSION:  08/19/2014  PRIMARY CARE PHYSICIAN: Madelyn Brunner, MD   REQUESTING/REFERRING PHYSICIAN: Paduchowski  CHIEF COMPLAINT:   Chief Complaint  Patient presents with  . Abdominal Pain  . Nausea  . Emesis    HISTORY OF PRESENT ILLNESS:  Edward Harris  is a 39 y.o. male who presents with diverticulitis and ileus.  3 days N/V, 1 day pain, no blood.  PAST MEDICAL HISTORY:   Past Medical History  Diagnosis Date  . Diverticulitis   . Hypertension   . IBS (irritable bowel syndrome)   . Nephrolithiasis     PAST SURGICAL HISTORY:   Past Surgical History  Procedure Laterality Date  . Inguinal hernia repair Right   . Kidney stone surgery      extraction    SOCIAL HISTORY:   History  Substance Use Topics  . Smoking status: Former Research scientist (life sciences)  . Smokeless tobacco: Not on file  . Alcohol Use: 0.0 oz/week    0 Standard drinks or equivalent per week     Comment: "occassional"    FAMILY HISTORY:   Family History  Problem Relation Age of Onset  . Hypertension Mother   . Osteoarthritis Mother   . Heart attack Father   . Hypertension Brother   . Alzheimer's disease Maternal Aunt   . Lung cancer      DRUG ALLERGIES:   Allergies  Allergen Reactions  . Amlodipine     Sore arms    MEDICATIONS AT HOME:   Prior to Admission medications   Medication Sig Start Date End Date Taking? Authorizing Provider  losartan-hydrochlorothiazide (HYZAAR) 100-25 MG per tablet Take 1 tablet by mouth daily. 07/14/14  Yes Historical Provider, MD  predniSONE (STERAPRED UNI-PAK 21 TAB) 10 MG (21) TBPK tablet 6 day taper pack 08/03/14  Yes Historical Provider, MD    REVIEW OF SYSTEMS:  Review of Systems  Constitutional: Negative for fever, chills, weight loss and malaise/fatigue.  HENT: Negative for ear pain, hearing loss and  tinnitus.   Eyes: Negative for blurred vision, double vision, pain and redness.  Respiratory: Negative for cough, hemoptysis and shortness of breath.   Cardiovascular: Negative for chest pain, palpitations, orthopnea and leg swelling.  Gastrointestinal: Positive for nausea, vomiting, abdominal pain and constipation. Negative for diarrhea.  Genitourinary: Negative for dysuria, frequency and hematuria.  Musculoskeletal: Negative for back pain, joint pain and neck pain.  Skin:       No acne, rash, or lesions  Neurological: Negative for dizziness, tremors, focal weakness and weakness.  Endo/Heme/Allergies: Negative for polydipsia. Does not bruise/bleed easily.  Psychiatric/Behavioral: Negative for depression. The patient is not nervous/anxious and does not have insomnia.      VITAL SIGNS:   Filed Vitals:   08/19/14 1800 08/19/14 1830 08/19/14 2008 08/19/14 2125  BP: 168/96 160/94 157/91 144/94  Pulse: 79 78 83 79  Temp:      TempSrc:      Resp:   15 20  Height:      Weight:      SpO2: 94% 97% 98% 99%   Wt Readings from Last 3 Encounters:  08/19/14 99.791 kg (220 lb)    PHYSICAL EXAMINATION:  Physical Exam  Constitutional: He is oriented to person, place, and time. He appears well-developed and well-nourished. No distress.  HENT:  Head: Normocephalic and  atraumatic.  Mouth/Throat: Oropharynx is clear and moist.  Eyes: Conjunctivae and EOM are normal. Pupils are equal, round, and reactive to light. No scleral icterus.  Neck: Normal range of motion. Neck supple. No JVD present. No thyromegaly present.  Cardiovascular: Normal rate, regular rhythm and intact distal pulses.  Exam reveals no gallop and no friction rub.   No murmur heard. Respiratory: Effort normal and breath sounds normal. No respiratory distress. He has no wheezes. He has no rales.  GI: Soft. He exhibits no distension. There is tenderness (Left lower quadrant greater than right upper quadrant).  Hypoactive  accelerating in bowel sounds  Musculoskeletal: Normal range of motion. He exhibits no edema.  No arthritis, no gout  Lymphadenopathy:    He has no cervical adenopathy.  Neurological: He is alert and oriented to person, place, and time. No cranial nerve deficit.  No dysarthria, no aphasia  Skin: Skin is warm and dry. No rash noted. No erythema.  Psychiatric: He has a normal mood and affect. His behavior is normal. Judgment and thought content normal.    LABORATORY PANEL:   CBC  Recent Labs Lab 08/19/14 1508  WBC 9.6  HGB 15.6  HCT 48.4  PLT 185   ------------------------------------------------------------------------------------------------------------------  Chemistries   Recent Labs Lab 08/19/14 1751  NA 138  K 3.9  CL 104  CO2 25  GLUCOSE 112*  BUN 10  CREATININE 0.76  CALCIUM 8.7*  AST 20  ALT 25  ALKPHOS 75  BILITOT 1.2   ------------------------------------------------------------------------------------------------------------------  Cardiac Enzymes No results for input(s): TROPONINI in the last 168 hours. ------------------------------------------------------------------------------------------------------------------  RADIOLOGY:  Ct Abdomen Pelvis W Contrast  08/19/2014   CLINICAL DATA:  Left lower quadrant abdominal pain with nausea and vomiting starting on Saturday.  EXAM: CT ABDOMEN AND PELVIS WITH CONTRAST  TECHNIQUE: Multidetector CT imaging of the abdomen and pelvis was performed using the standard protocol following bolus administration of intravenous contrast.  CONTRAST:  139mL OMNIPAQUE IOHEXOL 300 MG/ML  SOLN  COMPARISON:  07/23/2014  FINDINGS: Lower chest: Dependent subsegmental atelectasis in both lower lobes. Mild chronic scarring in the right middle lobe.  Hepatobiliary: 6 mm hypodense lesion in segment 6 of the liver, image 36 series 2, and similar to 08/15/2013, technically too small to characterize although statistically likely to be  benign. I  Pancreas: Unremarkable  Spleen: Unremarkable  Adrenals/Urinary Tract: Slight nodularity along the medial limb left adrenal gland but no overt mass. Punctate calcification in the upper anterior urinary bladder wall on image 78 of series 2 without observed mass.  Stomach/Bowel: Mobile cecum extending into the left pelvis with normal appendix. Diverticulosis involving the transverse, descending, and proximal sigmoid colon.  In the left lower quadrant at the junction of the descending and sigmoid colon there is abnormal wall thickening, surrounding mild mesenteric adenopathy, and abnormal stranding. This is the same location as previous although the size of adjacent lymph nodes is increased. Small ring-like area of density along the omental fat in this area on image 63 of series 2 favors secondary inflammation over appendagitis epiploica.  Mildly dilated proximal loops of jejunum, possibly from localized ileus, containing air-fluid levels.  Vascular/Lymphatic: Small right common iliac lymph nodes are present. Right external iliac node 0.8 cm in short axis, image 79 series 2. One of the lymph nodes adjacent to the inflammatory stranding at the junction of the descending and sigmoid colon measures 0.9 cm in short axis on image 65 series 2.  Reproductive: Unremarkable  Other: Interposed between the  right seminal vesicle and the rectum, there is a centrally calcified 1.3 by 0.8 cm structure which is unchanged from 10/14/2011 and accordingly probably a chronically calcified postinflammatory lymph node.  Musculoskeletal: A left inguinal hernia contains adipose tissue.  Bony bridging of the right sacroiliac joint.  Disc osteophyte complex at L4-5 and L5-S1.  IMPRESSION: 1. Once again there is abnormal wall thickening at the junction of the descending and sigmoid colon, with mild increase in the size of the adjacent lymph nodes compared to the prior apparent flare up from last month. Differential diagnostic  considerations include a small during/recurrent acute diverticulitis, although a colon mass at this location with secondary inflammatory findings is not completely excluded. This may warrant a follow up colonoscopy at the resolution of the patient's acute symptoms. 2. Dilated proximal jejunum with air-fluid levels, probably due to secondary ileus. 3. Considerable diverticulosis of the transverse, descending, and proximal sigmoid colon. 4. Left inguinal hernia contains adipose tissue. 5. 6 mm hypodense lesion in segment 6 of the liver, not appreciably changed from 1 year ago, technically too small to characterize although statistically likely to be benign.   Electronically Signed   By: Van Clines M.D.   On: 08/19/2014 20:24    EKG:  No orders found for this or any previous visit.  IMPRESSION AND PLAN:  Principal Problem:   Diverticulitis - this is recurrent as the patient recently had an episode of the same. At that time he states he was admitted for several days due to inability to take by mouth due to nausea and vomiting. He was to be very similar course to what he is describing this admission. We'll continue IV antibiotics for now until he is tolerating by mouth. Active Problems:   Ileus - possibly secondary to abdominal inflammation from the diverticulitis. No obstruction seen on CT. Keep nothing by mouth for now, IV meds for nausea and vomiting, monitor and advance diet as tolerated.   Hypertension - hold home by mouth meds for now as patient is nothing by mouth, use IV when necessary medications to control his blood pressure.   All the records are reviewed and case discussed with ED provider. Management plans discussed with the patient and/or family.  DVT PROPHYLAXIS: SubQ lovenox  ADMISSION STATUS: Observation  CODE STATUS: Full  TOTAL TIME TAKING CARE OF THIS PATIENT: 45 minutes.    Shantil Vallejo [place] 08/19/2014, 10:09 PM  [facility]  Office   [phone]  CC: Primary care physician; Madelyn Brunner, MD

## 2014-08-20 ENCOUNTER — Encounter: Payer: Self-pay | Admitting: Urgent Care

## 2014-08-20 LAB — BASIC METABOLIC PANEL
Anion gap: 6 (ref 5–15)
BUN: 9 mg/dL (ref 6–20)
CO2: 27 mmol/L (ref 22–32)
Calcium: 8.6 mg/dL — ABNORMAL LOW (ref 8.9–10.3)
Chloride: 104 mmol/L (ref 101–111)
Creatinine, Ser: 0.87 mg/dL (ref 0.61–1.24)
GFR calc Af Amer: >60 mL/min (ref >60)
GLUCOSE: 104 mg/dL — AB (ref 65–99)
POTASSIUM: 3.3 mmol/L — AB (ref 3.5–5.1)
Sodium: 137 mmol/L (ref 135–145)

## 2014-08-20 LAB — CBC
HCT: 41.4 % (ref 40.0–52.0)
Hemoglobin: 13.7 g/dL (ref 13.0–18.0)
MCH: 28.8 pg (ref 26.0–34.0)
MCHC: 33.2 g/dL (ref 32.0–36.0)
MCV: 86.6 fL (ref 80.0–100.0)
PLATELETS: 222 10*3/uL (ref 150–440)
RBC: 4.78 MIL/uL (ref 4.40–5.90)
RDW: 13.5 % (ref 11.5–14.5)
WBC: 9 10*3/uL (ref 3.8–10.6)

## 2014-08-20 MED ORDER — ONDANSETRON HCL 4 MG/2ML IJ SOLN
4.0000 mg | Freq: Four times a day (QID) | INTRAMUSCULAR | Status: DC | PRN
  Administered 2014-08-20 – 2014-08-21 (×3): 4 mg via INTRAVENOUS
  Filled 2014-08-20 (×3): qty 2

## 2014-08-20 MED ORDER — LEVOFLOXACIN IN D5W 750 MG/150ML IV SOLN
750.0000 mg | INTRAVENOUS | Status: DC
  Administered 2014-08-20 – 2014-08-22 (×3): 750 mg via INTRAVENOUS
  Filled 2014-08-20 (×4): qty 150

## 2014-08-20 MED ORDER — ENOXAPARIN SODIUM 40 MG/0.4ML ~~LOC~~ SOLN
40.0000 mg | SUBCUTANEOUS | Status: DC
  Administered 2014-08-20 – 2014-08-22 (×3): 40 mg via SUBCUTANEOUS
  Filled 2014-08-20 (×3): qty 0.4

## 2014-08-20 MED ORDER — SODIUM CHLORIDE 0.9 % IV SOLN
INTRAVENOUS | Status: DC
  Administered 2014-08-20 – 2014-08-21 (×3): via INTRAVENOUS

## 2014-08-20 MED ORDER — CIPROFLOXACIN IN D5W 400 MG/200ML IV SOLN
400.0000 mg | Freq: Two times a day (BID) | INTRAVENOUS | Status: DC
  Administered 2014-08-20: 400 mg via INTRAVENOUS
  Filled 2014-08-20 (×3): qty 200

## 2014-08-20 MED ORDER — HYDROCHLOROTHIAZIDE 25 MG PO TABS
25.0000 mg | ORAL_TABLET | Freq: Every day | ORAL | Status: DC
  Administered 2014-08-20 – 2014-08-22 (×3): 25 mg via ORAL
  Filled 2014-08-20 (×3): qty 1

## 2014-08-20 MED ORDER — LOSARTAN POTASSIUM 50 MG PO TABS
100.0000 mg | ORAL_TABLET | Freq: Every day | ORAL | Status: DC
  Administered 2014-08-20 – 2014-08-22 (×3): 100 mg via ORAL
  Filled 2014-08-20 (×3): qty 2

## 2014-08-20 MED ORDER — METRONIDAZOLE IN NACL 5-0.79 MG/ML-% IV SOLN
500.0000 mg | Freq: Three times a day (TID) | INTRAVENOUS | Status: DC
  Administered 2014-08-20 – 2014-08-22 (×7): 500 mg via INTRAVENOUS
  Filled 2014-08-20 (×11): qty 100

## 2014-08-20 MED ORDER — HYDRALAZINE HCL 20 MG/ML IJ SOLN
10.0000 mg | INTRAMUSCULAR | Status: DC | PRN
  Filled 2014-08-20: qty 1

## 2014-08-20 MED ORDER — ONDANSETRON HCL 4 MG PO TABS: 4.0000 mg | ORAL_TABLET | Freq: Four times a day (QID) | ORAL | Status: DC | PRN

## 2014-08-20 MED ORDER — LOSARTAN POTASSIUM-HCTZ 100-25 MG PO TABS: 1.0000 | ORAL_TABLET | Freq: Every day | ORAL | Status: DC

## 2014-08-20 NOTE — Progress Notes (Signed)
[facility] [place] NAME: Edward Harris    MR#:  [id number]  DATE OF BIRTH:  03/20/1976  SUBJECTIVE:  Came in with left sided abdominal pain for 2 days. Was unable to tolerated po diet. Feels much better today. Ok to try CLD  REVIEW OF SYSTEMS:    Review of Systems  Constitutional: Negative for fever, chills and weight loss.  HENT: Negative for ear discharge, ear pain and nosebleeds.   Eyes: Negative for blurred vision, pain and discharge.  Respiratory: Negative for sputum production, shortness of breath, wheezing and stridor.   Cardiovascular: Negative for chest pain, palpitations, orthopnea and PND.  Gastrointestinal: Positive for nausea and abdominal pain. Negative for vomiting and diarrhea.  Genitourinary: Negative for urgency and frequency.  Musculoskeletal: Negative for back pain and joint pain.  Neurological: Negative for sensory change, speech change, focal weakness and weakness.  Psychiatric/Behavioral: Negative for depression and memory loss. The patient is not nervous/anxious.   All other systems reviewed and are negative.    DRUG ALLERGIES:   Allergies  Allergen Reactions  . Amlodipine     Sore arms    VITALS:  Blood pressure 153/97, pulse 79, temperature 99.5 F (37.5 C), temperature source Oral, resp. rate 17, height 5\' 9"  (1.753 m), weight 106.232 kg (234 lb 3.2 oz), SpO2 98 %.  PHYSICAL EXAMINATION:   Physical Exam  GENERAL:  39 y.o.-year-old patient lying in the bed with no acute distress.  EYES: Pupils equal, round, reactive to light and accommodation. No scleral icterus. Extraocular muscles intact.  HEENT: Head atraumatic, normocephalic. Oropharynx and nasopharynx clear.  NECK:  Supple, no jugular venous distention. No thyroid enlargement, no tenderness.  LUNGS: Normal breath sounds bilaterally, no wheezing, rales, rhonchi. No use of accessory muscles of respiration.  CARDIOVASCULAR: S1, S2 normal.  No murmurs, rubs, or gallops.  ABDOMEN: Soft, diffuse tenderness more localized in the left LLQ - nondistended. Bowel sounds present. No organomegaly or mass.  EXTREMITIES: No cyanosis, clubbing or edema b/l.    NEUROLOGIC: Cranial nerves II through XII are intact. No focal Motor or sensory deficits b/l.   PSYCHIATRIC: The patient is alert and oriented x 3.  SKIN: No obvious rash, lesion, or ulcer.    LABORATORY PANEL:   CBC  Recent Labs Lab 08/20/14 0439  WBC 9.0  HGB 13.7  HCT 41.4  PLT 222   ------------------------------------------------------------------------------------------------------------------  Chemistries   Recent Labs Lab 08/19/14 1751 08/20/14 0439  NA 138 137  K 3.9 3.3*  CL 104 104  CO2 25 27  GLUCOSE 112* 104*  BUN 10 9  CREATININE 0.76 0.87  CALCIUM 8.7* 8.6*  AST 20  --   ALT 25  --   ALKPHOS 75  --   BILITOT 1.2  --    ------------------------------------------------------------------------------------------------------------------  Cardiac Enzymes No results for input(s): TROPONINI in the last 168 hours. ------------------------------------------------------------------------------------------------------------------  RADIOLOGY:  Ct Abdomen Pelvis W Contrast  08/19/2014   CLINICAL DATA:  Left lower quadrant abdominal pain with nausea and vomiting starting on Saturday.  EXAM: CT ABDOMEN AND PELVIS WITH CONTRAST  TECHNIQUE: Multidetector CT imaging of the abdomen and pelvis was performed using the standard protocol following bolus administration of intravenous contrast.  CONTRAST:  129mL OMNIPAQUE IOHEXOL 300 MG/ML  SOLN  COMPARISON:  07/23/2014  FINDINGS: Lower chest: Dependent subsegmental atelectasis in both lower lobes. Mild chronic scarring in the right middle lobe.  Hepatobiliary: 6 mm hypodense lesion in segment 6 of  the liver, image 36 series 2, and similar to 08/15/2013, technically too small to characterize although statistically  likely to be benign. I  Pancreas: Unremarkable  Spleen: Unremarkable  Adrenals/Urinary Tract: Slight nodularity along the medial limb left adrenal gland but no overt mass. Punctate calcification in the upper anterior urinary bladder wall on image 78 of series 2 without observed mass.  Stomach/Bowel: Mobile cecum extending into the left pelvis with normal appendix. Diverticulosis involving the transverse, descending, and proximal sigmoid colon.  In the left lower quadrant at the junction of the descending and sigmoid colon there is abnormal wall thickening, surrounding mild mesenteric adenopathy, and abnormal stranding. This is the same location as previous although the size of adjacent lymph nodes is increased. Small ring-like area of density along the omental fat in this area on image 63 of series 2 favors secondary inflammation over appendagitis epiploica.  Mildly dilated proximal loops of jejunum, possibly from localized ileus, containing air-fluid levels.  Vascular/Lymphatic: Small right common iliac lymph nodes are present. Right external iliac node 0.8 cm in short axis, image 79 series 2. One of the lymph nodes adjacent to the inflammatory stranding at the junction of the descending and sigmoid colon measures 0.9 cm in short axis on image 65 series 2.  Reproductive: Unremarkable  Other: Interposed between the right seminal vesicle and the rectum, there is a centrally calcified 1.3 by 0.8 cm structure which is unchanged from 10/14/2011 and accordingly probably a chronically calcified postinflammatory lymph node.  Musculoskeletal: A left inguinal hernia contains adipose tissue.  Bony bridging of the right sacroiliac joint.  Disc osteophyte complex at L4-5 and L5-S1.  IMPRESSION: 1. Once again there is abnormal wall thickening at the junction of the descending and sigmoid colon, with mild increase in the size of the adjacent lymph nodes compared to the prior apparent flare up from last month. Differential  diagnostic considerations include a small during/recurrent acute diverticulitis, although a colon mass at this location with secondary inflammatory findings is not completely excluded. This may warrant a follow up colonoscopy at the resolution of the patient's acute symptoms. 2. Dilated proximal jejunum with air-fluid levels, probably due to secondary ileus. 3. Considerable diverticulosis of the transverse, descending, and proximal sigmoid colon. 4. Left inguinal hernia contains adipose tissue. 5. 6 mm hypodense lesion in segment 6 of the liver, not appreciably changed from 1 year ago, technically too small to characterize although statistically likely to be benign.   Electronically Signed   By: Van Clines M.D.   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  -CLD _IVF -IV cipro +flagyl -GI dr Allen Norris to see pt -recent episode in April 2016 and may 2015 -denies any bloody stools  * Ileus - possibly secondary to abdominal inflammation from the diverticulitis. No obstruction seen on CT. -IV meds for nausea and vomiting, monitor and advance diet as tolerated.  * Hypertension - resume HCTZ-losartan   All the records are reviewed and case discussed with Care Management/Social Workerr. Management plans discussed with the patient, family and they are in agreement.  CODE STATUS:FULL CODE  DVT Prophylaxis: Lovenox  TOTAL TIME TAKING CARE OF THIS PATIENT:35 minutes.   POSSIBLE D/C IN 1-2 DAYS, DEPENDING ON CLINICAL CONDITION.   Colonel Krauser M.D on 08/20/2014 at 1:50 PM  Between 7am to 6pm -  Pager - [phone]  After 6pm go to [email or web address] - password [email or web address] [facility]  Office  [phone]  CC: Primary care physician; Madelyn Brunner, MD

## 2014-08-20 NOTE — Progress Notes (Signed)
Initial Nutrition Assessment  DOCUMENTATION CODES:  INTERVENTION: Meals and Snacks: Cater to patient preferences as able on current diet.   NUTRITION DIAGNOSIS:  Inadequate oral intake related to acute illness as evidenced by per patient/family report.   GOAL: Energy Intake: Patient will meet greater than or equal to 90% of their needs once diet advanced   MONITOR: Electrolyte and Renal Profile Diet advancement  Digestive Profile  REASON FOR ASSESSMENT:  Consult Poor PO  ASSESSMENT: Reason For Admission: diverticulitis PMHx: HTN, IBS Typical Fluid/ Food Intake: Just started on PO diet, CL lunch tray untouched at bedside Meal/ Snack Patterns: Patient reports a good appetite and intake of a regular diet prior to Friday, 5/13 when he began feeling nauseated and started vomiting. Supplements: None  Labs:  Protein Profile:  Recent Labs Lab 08/19/14 1751  ALBUMIN 4.2   Electrolyte and Renal Profile:    Recent Labs Lab 08/19/14 1751 08/20/14 0439  BUN 10 9  CREATININE 0.76 0.87  NA 138 137  K 3.9 3.3*   Meds: Flagyl, NS @ 59ml/hr  UOP:  Intake/Output Summary (Last 24 hours) at 08/20/14 1413 Last data filed at 08/20/14 0400  Gross per 24 hour  Intake    280 ml  Output      0 ml  Net    280 ml     Physical Findings: n/a Weight Changes: Patient reports that he has not lost any significant weight recently. He did lose a significant amount of weight x 1 year ago (pt reports 30#), but he has maintained a stable weight x 1 year.  Height:  Ht Readings from Last 1 Encounters:  08/19/14 5\' 9"  (1.753 m)    Weight:  Wt Readings from Last 1 Encounters:  08/19/14 234 lb 3.2 oz (106.232 kg)    Ideal Body Weight:     Wt Readings from Last 10 Encounters:  08/19/14 234 lb 3.2 oz (106.232 kg)    BMI:  Body mass index is 34.57 kg/(m^2).  Skin:  Reviewed, no concerns  Diet Order:  Diet clear liquid Room service appropriate?: Yes; Fluid consistency::  Thin; Fluid restriction:: Other (see comments)  EDUCATION NEEDS:  No education needs identified at this time   Intake/Output Summary (Last 24 hours) at 08/20/14 1409 Last data filed at 08/20/14 0400  Gross per 24 hour  Intake    280 ml  Output      0 ml  Net    280 ml    Last BM:  5/13 per patient  Roda Shutters, RDN Pager: [phone] Office: [place]

## 2014-08-20 NOTE — Progress Notes (Signed)
Pt receiving IV antibiotics. No complaints of pain. PRN Zofran given x1 for nausea. Diet changed to clear liquid. Will continue to monitor.

## 2014-08-20 NOTE — Consult Note (Signed)
Gastroenterology Consultation  Referring Provider:  Dr Posey Pronto Primary Care Physician:  Madelyn Brunner, MD Primary Gastroenterologist:  Dr. Gilford Rile Reason for Consultation:     Diverticulitis  Date of Admission:  08/19/2014 Date of Consultation:  08/20/2014         HPI:   Edward Harris is a 39 y.o. male admitted with severe abdominal pain.  Pt states he was diagnosed with diverticulitis in May 2015 at that time he was treated with cipro & flagyl.  He states in the Fall 2015, he felt constipated, took a laxative & believes he had diverticulitis at that time.  More recently, around April 18, he was treated with 14 days of cipro & flagyl.  He started to feel better until 3 days ago.  At that time,he began to have severe LLQ pain.  He started vomiting the next day & vomited all day.  Denies hematemesis.  He had fever & chills.  Pain was 10/10 at worst.  Pain 3/10 now.  He has had no BM in 3 days.  He has sensation of incomplete evacuation for the past year but has loose stools 1-2 times per day.  Denies rectal bleeding or melena.  Movement makes his pain worse. Eating makes his pain worse.    CT A/P once again shows there is abnormal wall thickening at the junction of the descending and sigmoid colon, with mild increase in the size of the adjacent lymph nodes compared to the prior apparent flare up from last month. Differential diagnostic considerations include a small during/recurrent acute diverticulitis, although a colon mass at this location with secondary inflammatory findings is not completely excluded, dilated proximal jejunum with air-fluid levels, probably due to secondary ileus, & considerable diverticulosis of the transverse, descending, and proximal sigmoid colon.  There is also a left inguinal hernia that contains adipose tissue & 5. 6 mm hypodense lesion in segment 6 of the liver, not appreciably changed from 1 year ago, technically too small to characterize although statistically  likely to be benign.  Previous 08/15/13 CT-> The colon appears incompletely rotated with the majority of the colonic loops lying to the left of midline. A normal calibered,gas-filled appendix is demonstrated. There is an abnormal appearance of the descending colon and proximal rectosigmoid in a fashion consistent with acute diverticulitis. There is no evidence of perforation or abscess formation.  Past Medical History  Diagnosis Date  . Diverticulitis   . Hypertension   . IBS (irritable bowel syndrome)   . Nephrolithiasis     Past Surgical History  Procedure Laterality Date  . Inguinal hernia repair Right   . Kidney stone surgery      extraction/lithotripsy    Prior to Admission medications   Medication Sig Start Date End Date Taking? Authorizing Provider  losartan-hydrochlorothiazide (HYZAAR) 100-25 MG per tablet Take 1 tablet by mouth daily. 07/14/14  Yes Historical Provider, MD  predniSONE (STERAPRED UNI-PAK 21 TAB) 10 MG (21) TBPK tablet 6 day taper pack 08/03/14  Yes Historical Provider, MD    Family History  Problem Relation Age of Onset  . Hypertension Mother   . Osteoarthritis Mother   . Heart attack Father   . Hypertension Brother   . Alzheimer's disease Maternal Aunt   . Lung cancer    . Colon cancer Maternal Grandmother 33    History  Substance Use Topics  . Smoking status: Former Research scientist (life sciences)  . Smokeless tobacco: Not on file  . Alcohol Use: 0.0 oz/week  0 Standard drinks or equivalent per week     Comment: "occassional" couple drinks per month    Allergies as of 08/19/2014 - Review Complete 08/19/2014  Allergen Reaction Noted  . Amlodipine  08/19/2014    Review of Systems:    All systems reviewed and negative except where noted in HPI.   Physical Exam:  Vital signs in last 24 hours: Temp:  [98.4 F (36.9 C)-99.5 F (37.5 C)] 99.5 F (37.5 C) (05/16 0846) Pulse Rate:  [71-90] 79 (05/16 0846) Resp:  [15-20] 17 (05/16 0846) BP: (134-168)/(74-96) 149/93  mmHg (05/16 0846) SpO2:  [94 %-100 %] 98 % (05/16 0846) Weight:  [99.791 kg (220 lb)-106.232 kg (234 lb 3.2 oz)] 106.232 kg (234 lb 3.2 oz) (05/15 2359) Last BM Date: 08/17/14 Body mass index is 34.57 kg/(m^2). General:   Alert,  Well-developed, well-nourished, pleasant and cooperative in NAD,  Accompanied by his mother & nephew. Head:  Normocephalic and atraumatic. Eyes:  Sclera clear, no icterus.   Conjunctiva pink. Ears:  Normal auditory acuity. Nose:  No deformity, discharge, or lesions. Mouth:  No deformity or lesions,oropharynx pink & moist. Neck:  Supple; no masses or thyromegaly. Lungs:  Respirations even and unlabored.  Clear throughout to auscultation.   No wheezes, crackles, or rhonchi. No acute distress. Heart:  Regular rate and rhythm; no murmurs, clicks, rubs, or gallops. Abdomen:  Normal bowel sounds.  No bruits.  Soft, and non-distended without masses, hepatosplenomegaly or hernias noted.  +TTP LLQ.  No guarding or rebound tenderness.     Rectal:  Deferred  Msk:  Symmetrical without gross deformities.  Good, equal movement & strength bilaterally. Pulses:  Normal pulses noted. Extremities:  No clubbing.  Trace ankle edema bilaterally.  No cyanosis. Neurologic:  Alert and oriented x3;  grossly normal neurologically. Skin:  Intact without significant lesions or rashes.  No jaundice. Lymph Nodes:  No significant cervical adenopathy. Psych:  Alert and cooperative. Normal mood and affect.  LAB RESULTS:  Recent Labs  08/19/14 1508 08/20/14 0439  WBC 9.6 9.0  HGB 15.6 13.7  HCT 48.4 41.4  PLT 185 222   BMET  Recent Labs  08/19/14 1751 08/20/14 0439  NA 138 137  K 3.9 3.3*  CL 104 104  CO2 25 27  GLUCOSE 112* 104*  BUN 10 9  CREATININE 0.76 0.87  CALCIUM 8.7* 8.6*   LFT  Recent Labs  08/19/14 1751  PROT 7.5  ALBUMIN 4.2  AST 20  ALT 25  ALKPHOS 75  BILITOT 1.2   PT/INR No results for input(s): LABPROT, INR in the last 72 hours.  STUDIES: Ct  Abdomen Pelvis W Contrast  08/19/2014   CLINICAL DATA:  Left lower quadrant abdominal pain with nausea and vomiting starting on Saturday.  EXAM: CT ABDOMEN AND PELVIS WITH CONTRAST  TECHNIQUE: Multidetector CT imaging of the abdomen and pelvis was performed using the standard protocol following bolus administration of intravenous contrast.  CONTRAST:  153mL OMNIPAQUE IOHEXOL 300 MG/ML  SOLN  COMPARISON:  07/23/2014  FINDINGS: Lower chest: Dependent subsegmental atelectasis in both lower lobes. Mild chronic scarring in the right middle lobe.  Hepatobiliary: 6 mm hypodense lesion in segment 6 of the liver, image 36 series 2, and similar to 08/15/2013, technically too small to characterize although statistically likely to be benign. I  Pancreas: Unremarkable  Spleen: Unremarkable  Adrenals/Urinary Tract: Slight nodularity along the medial limb left adrenal gland but no overt mass. Punctate calcification in the upper anterior urinary bladder  wall on image 78 of series 2 without observed mass.  Stomach/Bowel: Mobile cecum extending into the left pelvis with normal appendix. Diverticulosis involving the transverse, descending, and proximal sigmoid colon.  In the left lower quadrant at the junction of the descending and sigmoid colon there is abnormal wall thickening, surrounding mild mesenteric adenopathy, and abnormal stranding. This is the same location as previous although the size of adjacent lymph nodes is increased. Small ring-like area of density along the omental fat in this area on image 63 of series 2 favors secondary inflammation over appendagitis epiploica.  Mildly dilated proximal loops of jejunum, possibly from localized ileus, containing air-fluid levels.  Vascular/Lymphatic: Small right common iliac lymph nodes are present. Right external iliac node 0.8 cm in short axis, image 79 series 2. One of the lymph nodes adjacent to the inflammatory stranding at the junction of the descending and sigmoid colon  measures 0.9 cm in short axis on image 65 series 2.  Reproductive: Unremarkable  Other: Interposed between the right seminal vesicle and the rectum, there is a centrally calcified 1.3 by 0.8 cm structure which is unchanged from 10/14/2011 and accordingly probably a chronically calcified postinflammatory lymph node.  Musculoskeletal: A left inguinal hernia contains adipose tissue.  Bony bridging of the right sacroiliac joint.  Disc osteophyte complex at L4-5 and L5-S1.  IMPRESSION: 1. Once again there is abnormal wall thickening at the junction of the descending and sigmoid colon, with mild increase in the size of the adjacent lymph nodes compared to the prior apparent flare up from last month. Differential diagnostic considerations include a small during/recurrent acute diverticulitis, although a colon mass at this location with secondary inflammatory findings is not completely excluded. This may warrant a follow up colonoscopy at the resolution of the patient's acute symptoms. 2. Dilated proximal jejunum with air-fluid levels, probably due to secondary ileus. 3. Considerable diverticulosis of the transverse, descending, and proximal sigmoid colon. 4. Left inguinal hernia contains adipose tissue. 5. 6 mm hypodense lesion in segment 6 of the liver, not appreciably changed from 1 year ago, technically too small to characterize although statistically likely to be benign.   Electronically Signed   By: Van Clines M.D.   On: 08/19/2014 20:24    Impression / Plan:   MACEO HERNAN is a 39 y.o. y/o male with recurrent/refractory sigmoid diverticulitis.  Initial episode was last year, but he never had a followup colonoscopy.  Given full course of recent cipro & flagyl last month, he will need a change in antibiotic therapy with close interval followup colonoscopy to look for malignancy.  If no improvement with medical management, he will need inpatient surgical consult.  He may have ileus but vomiting has  ceased at this time.  Plan: #1 Change Antibiotics to Levaquin/Flagyl while inpatient.  Can change to Augmentin 875mg  BID once able to tolerate PO for 10 days.total #2 Outpatient colonoscopy in 4-6 weeks  Thank you for involving me in the care of this patient.    Vickey Huger, NP  08/20/2014, 10:52 AM [facility]  [place], [place] [address] Phone: [phone] Fax : [phone]

## 2014-08-20 NOTE — Progress Notes (Signed)
Pt is admitted with diverticulitis, denies nausea/pain at this time, but reports abdominal cramping in LLQ and nausea/vomiting x3 days. Pt is alert and oriented. VSS. IVF and IV antibiotic infusing. Voiding without difficulty. Sleeping between care, will continue to monitor.

## 2014-08-20 NOTE — Care Management (Signed)
Met briefly with patient to introduce RNCM role. Patient with family present at bedside. States the "Mrs. Is at home". He states he is independent with daily needs. His PCP is Dr. Lisette Grinder at [facility]. Patient NPO per RN.

## 2014-08-21 DIAGNOSIS — Z888 Allergy status to other drugs, medicaments and biological substances status: Secondary | ICD-10-CM | POA: Diagnosis not present

## 2014-08-21 DIAGNOSIS — Z87442 Personal history of urinary calculi: Secondary | ICD-10-CM | POA: Diagnosis not present

## 2014-08-21 DIAGNOSIS — K567 Ileus, unspecified: Secondary | ICD-10-CM | POA: Diagnosis present

## 2014-08-21 DIAGNOSIS — I10 Essential (primary) hypertension: Secondary | ICD-10-CM | POA: Diagnosis present

## 2014-08-21 DIAGNOSIS — Z87891 Personal history of nicotine dependence: Secondary | ICD-10-CM | POA: Diagnosis not present

## 2014-08-21 DIAGNOSIS — K5732 Diverticulitis of large intestine without perforation or abscess without bleeding: Secondary | ICD-10-CM | POA: Diagnosis present

## 2014-08-21 DIAGNOSIS — K589 Irritable bowel syndrome without diarrhea: Secondary | ICD-10-CM | POA: Diagnosis present

## 2014-08-21 LAB — MAGNESIUM: MAGNESIUM: 2.1 mg/dL (ref 1.7–2.4)

## 2014-08-21 MED ORDER — POTASSIUM CHLORIDE IN NACL 20-0.9 MEQ/L-% IV SOLN
INTRAVENOUS | Status: DC
  Administered 2014-08-21 – 2014-08-22 (×2): via INTRAVENOUS
  Filled 2014-08-21 (×4): qty 1000

## 2014-08-21 MED ORDER — SENNOSIDES-DOCUSATE SODIUM 8.6-50 MG PO TABS
1.0000 | ORAL_TABLET | Freq: Two times a day (BID) | ORAL | Status: DC
  Administered 2014-08-21 – 2014-08-22 (×3): 1 via ORAL
  Filled 2014-08-21 (×3): qty 1

## 2014-08-21 NOTE — Progress Notes (Signed)
Pt is alert and oriented. VSS, BP elevated, pt and wife declined hydralazine PRN for diastolic BP >658 and would like to wait for BP pills in am. Denies nausea at this time, reports nausea only with PO intake. IVF and IV antibiotic infusing. Voiding without difficulty. Sleeping between care, will continue to monitor.

## 2014-08-21 NOTE — Progress Notes (Signed)
Patient on clear liquid- tolerated well, no complain of pain, had some nausea- given zofran IV x1- with some relief, K+-3.3- IV fluids NS changed to -NS with KCL 72meq at 26ml/hr. Recheck K+ in am. Senokot-S started today- no BM since 08/17/14. Ambulates to bathroom on own.

## 2014-08-21 NOTE — Consult Note (Signed)
  Subjective: Patient states he has no pain at this time. He has had some nausea but no vomiting. He has early satiety. He has tolerated clear liquids well.  Objective: Vital signs in last 24 hours: Temp:  [98.6 F (37 C)-98.9 F (37.2 C)] 98.7 F (37.1 C) (05/17 0744) Pulse Rate:  [82-85] 82 (05/17 0744) Resp:  [18-20] 18 (05/17 0744) BP: (147-152)/(95-102) 147/99 mmHg (05/17 0744) SpO2:  [99 %-100 %] 99 % (05/17 0744) Last BM Date: 08/17/14 No LMP for male patient. Body mass index is 34.57 kg/(m^2). General:   Alert,  Well-developed, well-nourished, pleasant and cooperative in NAD, mother at bedside Head:  Normocephalic and atraumatic. Eyes:  Sclera clear, no icterus.   Conjunctiva pink. Mouth:  No deformity or lesions, oropharynx pink & moist. Neck:  Supple; no masses or thyromegaly. Heart:  Regular rate and rhythm; no murmurs, clicks, rubs, or gallops. Abdomen:   Normal bowel sounds.  Soft, nontender and nondistended. No masses, hepatosplenomegaly or hernias noted.  No guarding or rebound tenderness.   Msk:  Symmetrical without gross deformities. Good equal movement & strength bilaterally. Pulses:  Normal pulses noted. Extremities:  Without clubbing or edema.  No cyanosis Neurologic:  Alert and  oriented x3;  grossly normal neurologically. Skin:  Intact without significant lesions or rashes. Cervical Nodes:  No significant cervical adenopathy. Psych:  Alert and cooperative. Normal mood and affect.  Intake/Output from previous day: 05/16 0701 - 05/17 0700 In: 2250 [I.V.:1800; IV Piggyback:450] Out: 3 [Urine:3]  Lab Results:  Recent Labs  08/19/14 1508 08/20/14 0439  WBC 9.6 9.0  HGB 15.6 13.7  HCT 48.4 41.4  PLT 185 222   BMET  Recent Labs  08/19/14 1751 08/20/14 0439  NA 138 137  K 3.9 3.3*  CL 104 104  CO2 25 27  GLUCOSE 112* 104*  BUN 10 9  CREATININE 0.76 0.87  CALCIUM 8.7* 8.6*   LFT  Recent Labs  08/19/14 1751  PROT 7.5  ALBUMIN 4.2  AST  20  ALT 25  ALKPHOS 75  BILITOT 1.2  LIPASE 38   Assessment: #1 Recurrent/Refractory Diverticulitis:  Improving on Levaquin and Flagyl #2 Ileus: Improved  Plan: #1 patient advised to ambulate & sit up in chair as tolerated to help with ileus #2 complete 10 days of antibiotics #3 continue supportive measures    Vickey Huger  08/21/2014, 12:29 PM [facility]  [place],  [address] Phone: [phone] Fax : [phone]

## 2014-08-21 NOTE — Progress Notes (Signed)
[place] [facility] [place] NAME: Edward Harris    MR#:  [id number]  DATE OF BIRTH:  [birth year]  SUBJECTIVE:  Came in with left sided abdominal pain for 2 days. -vomited y'day -Afraid to try to eat CLD REVIEW OF SYSTEMS:    Review of Systems  Constitutional: Negative for fever, chills and weight loss.  HENT: Negative for ear discharge, ear pain and nosebleeds.   Eyes: Negative for blurred vision, pain and discharge.  Respiratory: Negative for sputum production, shortness of breath, wheezing and stridor.   Cardiovascular: Negative for chest pain, palpitations, orthopnea and PND.  Gastrointestinal: Positive for nausea, vomiting and abdominal pain. Negative for diarrhea.  Genitourinary: Negative for urgency and frequency.  Musculoskeletal: Negative for back pain and joint pain.  Neurological: Negative for sensory change, speech change, focal weakness and weakness.  Psychiatric/Behavioral: Negative for depression and hallucinations. The patient is not nervous/anxious.   All other systems reviewed and are negative.   DRUG ALLERGIES:   Allergies  Allergen Reactions  . Amlodipine     Sore arms    VITALS:  Blood pressure 147/99, pulse 82, temperature 98.7 F (37.1 C), temperature source Oral, resp. rate 18, height 5\' 9"  (1.753 m), weight 106.232 kg (234 lb 3.2 oz), SpO2 99 %.  PHYSICAL EXAMINATION:   Physical Exam  GENERAL:  39 y.o.-year-old patient lying in the bed with no acute distress.  EYES: Pupils equal, round, reactive to light and accommodation. No scleral icterus. Extraocular muscles intact.  HEENT: Head atraumatic, normocephalic. Oropharynx and nasopharynx clear.  NECK:  Supple, no jugular venous distention. No thyroid enlargement, no tenderness.  LUNGS: Normal breath sounds bilaterally, no wheezing, rales, rhonchi. No use of accessory muscles of respiration.  CARDIOVASCULAR: S1, S2 normal. No murmurs, rubs, or gallops.   ABDOMEN: Soft, diffuse tenderness more localized in the left LLQ - nondistended. Bowel sounds present. No organomegaly or mass.  EXTREMITIES: No cyanosis, clubbing or edema b/l.    NEUROLOGIC: Cranial nerves II through XII are intact. No focal Motor or sensory deficits b/l.   PSYCHIATRIC: The patient is alert and oriented x 3.  SKIN: No obvious rash, lesion, or ulcer.    LABORATORY PANEL:   CBC  Recent Labs Lab 08/20/14 0439  WBC 9.0  HGB 13.7  HCT 41.4  PLT 222   ------------------------------------------------------------------------------------------------------------------  Chemistries   Recent Labs Lab 08/19/14 1751 08/20/14 0439  NA 138 137  K 3.9 3.3*  CL 104 104  CO2 25 27  GLUCOSE 112* 104*  BUN 10 9  CREATININE 0.76 0.87  CALCIUM 8.7* 8.6*  AST 20  --   ALT 25  --   ALKPHOS 75  --   BILITOT 1.2  --    ------------------------------------------------------------------------------------------------------------------  Cardiac Enzymes No results for input(s): TROPONINI in the last 168 hours. ------------------------------------------------------------------------------------------------------------------  RADIOLOGY:  Ct Abdomen Pelvis W Contrast  08/19/2014   CLINICAL DATA:  Left lower quadrant abdominal pain with nausea and vomiting starting on Saturday.  EXAM: CT ABDOMEN AND PELVIS WITH CONTRAST  TECHNIQUE: Multidetector CT imaging of the abdomen and pelvis was performed using the standard protocol following bolus administration of intravenous contrast.  CONTRAST:  167mL OMNIPAQUE IOHEXOL 300 MG/ML  SOLN  COMPARISON:  07/23/2014  FINDINGS: Lower chest: Dependent subsegmental atelectasis in both lower lobes. Mild chronic scarring in the right middle lobe.  Hepatobiliary: 6 mm hypodense lesion in segment 6 of the liver, image 36 series 2, and similar to 08/15/2013,  technically too small to characterize although statistically likely to be benign. I  Pancreas:  Unremarkable  Spleen: Unremarkable  Adrenals/Urinary Tract: Slight nodularity along the medial limb left adrenal gland but no overt mass. Punctate calcification in the upper anterior urinary bladder wall on image 78 of series 2 without observed mass.  Stomach/Bowel: Mobile cecum extending into the left pelvis with normal appendix. Diverticulosis involving the transverse, descending, and proximal sigmoid colon.  In the left lower quadrant at the junction of the descending and sigmoid colon there is abnormal wall thickening, surrounding mild mesenteric adenopathy, and abnormal stranding. This is the same location as previous although the size of adjacent lymph nodes is increased. Small ring-like area of density along the omental fat in this area on image 63 of series 2 favors secondary inflammation over appendagitis epiploica.  Mildly dilated proximal loops of jejunum, possibly from localized ileus, containing air-fluid levels.  Vascular/Lymphatic: Small right common iliac lymph nodes are present. Right external iliac node 0.8 cm in short axis, image 79 series 2. One of the lymph nodes adjacent to the inflammatory stranding at the junction of the descending and sigmoid colon measures 0.9 cm in short axis on image 65 series 2.  Reproductive: Unremarkable  Other: Interposed between the right seminal vesicle and the rectum, there is a centrally calcified 1.3 by 0.8 cm structure which is unchanged from 10/14/2011 and accordingly probably a chronically calcified postinflammatory lymph node.  Musculoskeletal: A left inguinal hernia contains adipose tissue.  Bony bridging of the right sacroiliac joint.  Disc osteophyte complex at L4-5 and L5-S1.  IMPRESSION: 1. Once again there is abnormal wall thickening at the junction of the descending and sigmoid colon, with mild increase in the size of the adjacent lymph nodes compared to the prior apparent flare up from last month. Differential diagnostic considerations include a small  during/recurrent acute diverticulitis, although a colon mass at this location with secondary inflammatory findings is not completely excluded. This may warrant a follow up colonoscopy at the resolution of the patient's acute symptoms. 2. Dilated proximal jejunum with air-fluid levels, probably due to secondary ileus. 3. Considerable diverticulosis of the transverse, descending, and proximal sigmoid colon. 4. Left inguinal hernia contains adipose tissue. 5. 6 mm hypodense lesion in segment 6 of the liver, not appreciably changed from 1 year ago, technically too small to characterize although statistically likely to be benign.   Electronically Signed   By: Van Clines M.D.   On: 08/19/2014 20:24    ASSESSMENT AND PLAN:  39 year old AAM with h/o IBS, HTN comes in with  * Recurrent Diverticulitis - this is recurrent as the patient recently had an episode of the same last month -CLD _IVF -IV lvaquin +flagyl -GI dr Allen Norris evaluation noted -recent episode in April 2016 and may 2015 -denies any bloody stools  * Ileus - possibly secondary to abdominal inflammation from the diverticulitis. No obstruction seen on CT. -IV meds for nausea and vomiting, monitor and advance diet as tolerated.  * Hypertension - resume HCTZ-losartan   All the records are reviewed and case discussed with Care Management/Social Workerr. Management plans discussed with the patient, family and they are in agreement.  CODE STATUS:FULL CODE  DVT Prophylaxis: Lovenox  TOTAL TIME TAKING CARE OF THIS PATIENT:35 minutes.   POSSIBLE D/C IN 1-2 DAYS, DEPENDING ON CLINICAL CONDITION.   Lashaye Fisk M.D on 08/21/2014 at 12:11 PM  Between 7am to 6pm - Pager - [phone]  After 6pm go to [email or web address] -  password [email or web address] [facility]  [facility]  Office  [phone]  CC: Primary care physician; Madelyn Brunner, MD

## 2014-08-22 LAB — POTASSIUM: POTASSIUM: 3.6 mmol/L (ref 3.5–5.1)

## 2014-08-22 LAB — MAGNESIUM: Magnesium: 2.1 mg/dL (ref 1.7–2.4)

## 2014-08-22 MED ORDER — METRONIDAZOLE 500 MG PO TABS
500.0000 mg | ORAL_TABLET | Freq: Three times a day (TID) | ORAL | Status: DC
  Administered 2014-08-22: 500 mg via ORAL
  Filled 2014-08-22: qty 1

## 2014-08-22 MED ORDER — LEVOFLOXACIN 500 MG PO TABS: 500.0000 mg | ORAL_TABLET | Freq: Every day | ORAL | Status: DC

## 2014-08-22 MED ORDER — ONDANSETRON HCL 4 MG PO TABS: 4.0000 mg | ORAL_TABLET | Freq: Four times a day (QID) | ORAL | Status: DC | PRN

## 2014-08-22 MED ORDER — METRONIDAZOLE 500 MG PO TABS: 500.0000 mg | ORAL_TABLET | Freq: Three times a day (TID) | ORAL | Status: DC

## 2014-08-22 MED ORDER — BISACODYL 10 MG RE SUPP
10.0000 mg | Freq: Once | RECTAL | Status: AC
  Administered 2014-08-22: 10 mg via RECTAL
  Filled 2014-08-22: qty 1

## 2014-08-22 NOTE — Progress Notes (Signed)
Discharge Note: Pts VSS, discharge instructions given, prescriptions electronically sent by MD. Verbalized understanding.  Pt wheeled off the unit to lobby. Family providing transportation.

## 2014-08-22 NOTE — Progress Notes (Signed)
[place] was admitted to the Hospital on 08/19/2014 and Discharged  08/22/2014 and should be excused from work/school   for  5 days starting 08/19/2014 , may return to work/school without any restrictions on May 23rd  Call Fritzi Mandes MD, [facility]  [phone] with questions.  Kieren Ricci M.D on 08/22/2014,at 11:10 AM

## 2014-08-22 NOTE — Progress Notes (Signed)
Pt is alert and oriented. VSS. IVF and IV antibiotic infusing. Denies nausea/pain at this time. Voiding without difficulty. Sleeping between care, will continue to monitor.

## 2014-08-22 NOTE — Discharge Summary (Signed)
[facility] NAME: Edward Harris    MR#:  [id number]  DATE OF BIRTH:  Jul 04, 1975  DATE OF ADMISSION:  08/19/2014 ADMITTING PHYSICIAN: Lance Coon, MD  DATE OF DISCHARGE: 08/22/2014  PRIMARY CARE PHYSICIAN: Madelyn Brunner, MD    ADMISSION DIAGNOSIS:  Ileus [K56.7] Acute diverticulitis [K57.92] Intractable vomiting with nausea, vomiting of unspecified type [R11.10]  DISCHARGE DIAGNOSIS:  Recurrent Acute Diverticulitis  SECONDARY DIAGNOSIS:   Past Medical History  Diagnosis Date  . Diverticulitis   . Hypertension   . IBS (irritable bowel syndrome)   . Nephrolithiasis     HOSPITAL COURSE:  39 year old AAM with h/o IBS, HTN comes in with  * Recurrent Diverticulitis - this is recurrent as the patient recently had an episode of the same last month -CLD-->now advaince to FLD for 3-4 days and then pt advised to take soft diet -IV levaquin +flagyl-->change to po for total 10 days -GI dr Allen Norris evaluation noted -recent episode in April 2016 and may 2015 -denies any bloody stools  * Ileus - possibly secondary to abdominal inflammation from the diverticulitis. No obstruction seen on CT. -IV meds for nausea and vomiting, monitor and advance diet as tolerated.  * Hypertension - resume HCTZ-losartan   DISCHARGE CONDITIONS:    fair  CONSULTS OBTAINED:    GI -Lucilla Lame, MD  DRUG ALLERGIES:   Allergies  Allergen Reactions  . Amlodipine     Sore arms    DISCHARGE MEDICATIONS:   Current Discharge Medication List    START taking these medications   Details  levofloxacin (LEVAQUIN) 500 MG tablet Take 1 tablet (500 mg total) by mouth daily. Qty: 7 tablet, Refills: 0    metroNIDAZOLE (FLAGYL) 500 MG tablet Take 1 tablet (500 mg total) by mouth every 8 (eight) hours. Qty: 21 tablet, Refills: 0    ondansetron (ZOFRAN) 4 MG tablet Take 1 tablet (4 mg total) by mouth every 6 (six) hours as needed for  nausea. Qty: 20 tablet, Refills: 0      CONTINUE these medications which have NOT CHANGED   Details  losartan-hydrochlorothiazide (HYZAAR) 100-25 MG per tablet Take 1 tablet by mouth daily. Refills: 3      STOP taking these medications     predniSONE (STERAPRED UNI-PAK 21 TAB) 10 MG (21) TBPK tablet          DISCHARGE INSTRUCTIONS:    As above  If you experience worsening of your admission symptoms, develop shortness of breath, life threatening emergency, suicidal or homicidal thoughts you must seek medical attention immediately by calling 911 or calling your MD immediately  if symptoms less severe.  You Must read complete instructions/literature along with all the possible adverse reactions/side effects for all the Medicines you take and that have been prescribed to you. Take any new Medicines after you have completely understood and accept all the possible adverse reactions/side effects.   Please note  You were cared for by a hospitalist during your hospital stay. If you have any questions about your discharge medications or the care you received while you were in the hospital after you are discharged, you can call the unit and asked to speak with the hospitalist on call if the hospitalist that took care of you is not available. Once you are discharged, your primary care physician will handle any further medical issues. Please note that NO REFILLS for any discharge medications will be authorized once you are  discharged, as it is imperative that you return to your primary care physician (or establish a relationship with a primary care physician if you do not have one) for your aftercare needs so that they can reassess your need for medications and monitor your lab values.    Today   SUBJECTIVE   Feels indigestion in stomach. No diarrhea. tolerating CLD   VITAL SIGNS:  Blood pressure 140/92, pulse 86, temperature 98.3 F (36.8 C), temperature source Oral, resp. rate 16,  height 5\' 9"  (1.753 m), weight 106.232 kg (234 lb 3.2 oz), SpO2 97 %.  I/O:   Intake/Output Summary (Last 24 hours) at 08/22/14 1112 Last data filed at 08/22/14 0845  Gross per 24 hour  Intake 1907.5 ml  Output      0 ml  Net 1907.5 ml    PHYSICAL EXAMINATION:  GENERAL:  39 y.o.-year-old patient lying in the bed with no acute distress.  EYES: Pupils equal, round, reactive to light and accommodation. No scleral icterus. Extraocular muscles intact.  HEENT: Head atraumatic, normocephalic. Oropharynx and nasopharynx clear.  NECK:  Supple, no jugular venous distention. No thyroid enlargement, no tenderness.  LUNGS: Normal breath sounds bilaterally, no wheezing, rales,rhonchi or crepitation. No use of accessory muscles of respiration.  CARDIOVASCULAR: S1, S2 normal. No murmurs, rubs, or gallops.  ABDOMEN: Soft, non-tender, non-distended. Bowel sounds present. No organomegaly or mass.  EXTREMITIES: No pedal edema, cyanosis, or clubbing.  NEUROLOGIC: Cranial nerves II through XII are intact. Muscle strength 5/5 in all extremities. Sensation intact. Gait not checked.  PSYCHIATRIC: The patient is alert and oriented x 3.  SKIN: No obvious rash, lesion, or ulcer.   DATA REVIEW:   CBC  Recent Labs Lab 08/20/14 0439  WBC 9.0  HGB 13.7  HCT 41.4  PLT 222    Chemistries   Recent Labs Lab 08/19/14 1751 08/20/14 0439  08/22/14 0535  NA 138 137  --   --   K 3.9 3.3*  --  3.6  CL 104 104  --   --   CO2 25 27  --   --   GLUCOSE 112* 104*  --   --   BUN 10 9  --   --   CREATININE 0.76 0.87  --   --   CALCIUM 8.7* 8.6*  --   --   MG  --   --   < > 2.1  AST 20  --   --   --   ALT 25  --   --   --   ALKPHOS 75  --   --   --   BILITOT 1.2  --   --   --   < > = values in this interval not displayed.   RADIOLOGY:  CT abdomon done at admission showed:  1. Once again there is abnormal wall thickening at the junction of the descending and sigmoid colon, with mild increase in the  size of the adjacent lymph nodes compared to the prior apparent flare up from last month. Differential diagnostic considerations include a small during/recurrent acute diverticulitis, although a colon mass at this location with secondary inflammatory findings is not completely excluded. This may warrant a follow up colonoscopy at the resolution of the patient's acute symptoms. 2. Dilated proximal jejunum with air-fluid levels, probably due to secondary ileus. 3. Considerable diverticulosis of the transverse, descending, and proximal sigmoid colon. 4. Left inguinal hernia contains adipose tissue. 5. 6 mm hypodense lesion in segment 6 of  the liver, not appreciably changed from 1 year ago, technically too small to characterize although statistically likely to be benign.   Management plans discussed with the patient, family and they are in agreement.  CODE STATUS:     Code Status Orders        Start     Ordered   08/20/14 0000  Full code   Continuous     08/20/14 0000      TOTAL TIME TAKING CARE OF THIS PATIENT: 40 minutes.    Austen Wygant M.D on 08/22/2014 at 11:12 AM  Between 7am to 6pm - Pager - [phone] After 6pm go to [email or web address] - password [email or web address] [facility]  Office  [phone]  CC: Primary care physician; Madelyn Brunner, MD

## 2014-08-22 NOTE — Discharge Instructions (Signed)
Full liquid diet for 3-4 days and then soft diet

## 2014-08-22 NOTE — Progress Notes (Signed)
Ileus. Passing gas but no BM, receiving suppositories. Clears with diet advancing. Per nursing pt ambulating. No new needs identified.

## 2014-08-22 NOTE — Progress Notes (Signed)
Pt has not had a BM since 08/17/14. Dr Posey Pronto notified, suppository ordered. Will continue monitor.

## 2014-09-07 ENCOUNTER — Ambulatory Visit (INDEPENDENT_AMBULATORY_CARE_PROVIDER_SITE_OTHER): Payer: Managed Care, Other (non HMO) | Admitting: Urgent Care

## 2014-09-07 ENCOUNTER — Encounter: Payer: Self-pay | Admitting: Urgent Care

## 2014-09-07 ENCOUNTER — Other Ambulatory Visit: Payer: Self-pay | Admitting: Urgent Care

## 2014-09-07 VITALS — BP 155/95 | HR 80 | Temp 98.5°F | Ht 69.0 in | Wt 221.0 lb

## 2014-09-07 DIAGNOSIS — K5732 Diverticulitis of large intestine without perforation or abscess without bleeding: Secondary | ICD-10-CM | POA: Diagnosis not present

## 2014-09-07 MED ORDER — NA SULFATE-K SULFATE-MG SULF 17.5-3.13-1.6 GM/177ML PO SOLN: 1.0000 | ORAL | Status: DC

## 2014-09-07 NOTE — Progress Notes (Signed)
Primary Care Physician: Madelyn Brunner, MD Primary Gastroenterologist:  Dr Allen Norris  Chief Complaint  Patient presents with  . Follow-up    diverticulitis    HPI: Edward Harris is a 39 y.o. male here for FU diverticulitis to set up colonoscopy.  He feels well.  He denies abdominal pain, vomiting, fever, chills or diarrhea.  His appetite is improving.  He feels fatigued. He has completed Levaquin 500mg  daily & flagyl 500mg  TID.   Current Outpatient Prescriptions  Medication Sig Dispense Refill  . losartan-hydrochlorothiazide (HYZAAR) 100-25 MG per tablet Take 1 tablet by mouth daily.  3  . Na Sulfate-K Sulfate-Mg Sulf (SUPREP BOWEL PREP) SOLN Take 1 kit by mouth as directed. 1 Bottle 0   No current facility-administered medications for this visit.    Allergies as of 09/07/2014 - Review Complete 09/07/2014  Allergen Reaction Noted  . Amlodipine  08/19/2014    Review of Systems: Gen: see HPI ENT: Negative for hoarseness, difficulty swallowing , nasal congestion CV: Denies chest pain, angina, palpitations, syncope, orthopnea, PND, peripheral edema, and claudication. Resp: Denies dyspnea at rest, dyspnea with exercise, cough, sputum, wheezing, coughing up blood, and pleurisy. GI: See HPI GU:  Negative for dysuria, hematuria, urinary incontinence, urinary frequency, nocturnal urination.  Endo: Negative for unusual weight change or sweats Derm: Denies jaundice, rash, itching, or unhealing ulcers.  Psych: Denies depression, anxiety, memory loss, suicidal ideation, hallucinations, paranoia, and confusion. Heme: Denies bruising, bleeding, and enlarged lymph nodes.   Physical Examination:  BP 155/95 mmHg  Pulse 80  Temp(Src) 98.5 F (36.9 C)  Ht 5\' 9"  (1.753 m)  Wt 100.245 kg (221 lb)  BMI 32.62 kg/m2 No LMP for male patient. General:   Alert,  Well-developed, well-nourished, pleasant and cooperative in NAD Head:  Normocephalic and atraumatic. Eyes:  Sclera clear, no  icterus.   Conjunctiva pink. Mouth:  No deformity or lesions.  Oropharynx pink & moist. Neck:  Supple; no masses or thyromegaly. Heart:  Regular rate and rhythm; no murmurs, clicks, rubs,  or gallops. Abdomen:   Normal bowel sounds.  Soft, nontender and nondistended. No masses, hepatosplenomegaly or hernias noted. No guarding or rebound tenderness.   Msk:  Symmetrical without gross deformities. Normal posture. Pulses:  Normal pulses noted. Extremities:  Without clubbing or edema. Neurologic:  Alert and  oriented x3;  grossly normal neurologically. Skin:  Intact without significant lesions or rashes. Cervical Nodes:  No significant cervical adenopathy. Psych:  Alert and cooperative. Normal mood and affect.  Imaging Studies: Ct Abdomen Pelvis W Contrast  08/19/2014   CLINICAL DATA:  Left lower quadrant abdominal pain with nausea and vomiting starting on Saturday.  EXAM: CT ABDOMEN AND PELVIS WITH CONTRAST  TECHNIQUE: Multidetector CT imaging of the abdomen and pelvis was performed using the standard protocol following bolus administration of intravenous contrast.  CONTRAST:  14mL OMNIPAQUE IOHEXOL 300 MG/ML  SOLN  COMPARISON:  07/23/2014  FINDINGS: Lower chest: Dependent subsegmental atelectasis in both lower lobes. Mild chronic scarring in the right middle lobe.  Hepatobiliary: 6 mm hypodense lesion in segment 6 of the liver, image 36 series 2, and similar to 08/15/2013, technically too small to characterize although statistically likely to be benign. I  Pancreas: Unremarkable  Spleen: Unremarkable  Adrenals/Urinary Tract: Slight nodularity along the medial limb left adrenal gland but no overt mass. Punctate calcification in the upper anterior urinary bladder wall on image 78 of series 2 without observed mass.  Stomach/Bowel: Mobile cecum extending into the  left pelvis with normal appendix. Diverticulosis involving the transverse, descending, and proximal sigmoid colon.  In the left lower quadrant at  the junction of the descending and sigmoid colon there is abnormal wall thickening, surrounding mild mesenteric adenopathy, and abnormal stranding. This is the same location as previous although the size of adjacent lymph nodes is increased. Small ring-like area of density along the omental fat in this area on image 63 of series 2 favors secondary inflammation over appendagitis epiploica.  Mildly dilated proximal loops of jejunum, possibly from localized ileus, containing air-fluid levels.  Vascular/Lymphatic: Small right common iliac lymph nodes are present. Right external iliac node 0.8 cm in short axis, image 79 series 2. One of the lymph nodes adjacent to the inflammatory stranding at the junction of the descending and sigmoid colon measures 0.9 cm in short axis on image 65 series 2.  Reproductive: Unremarkable  Other: Interposed between the right seminal vesicle and the rectum, there is a centrally calcified 1.3 by 0.8 cm structure which is unchanged from 10/14/2011 and accordingly probably a chronically calcified postinflammatory lymph node.  Musculoskeletal: A left inguinal hernia contains adipose tissue.  Bony bridging of the right sacroiliac joint.  Disc osteophyte complex at L4-5 and L5-S1.  IMPRESSION: 1. Once again there is abnormal wall thickening at the junction of the descending and sigmoid colon, with mild increase in the size of the adjacent lymph nodes compared to the prior apparent flare up from last month. Differential diagnostic considerations include a small during/recurrent acute diverticulitis, although a colon mass at this location with secondary inflammatory findings is not completely excluded. This may warrant a follow up colonoscopy at the resolution of the patient's acute symptoms. 2. Dilated proximal jejunum with air-fluid levels, probably due to secondary ileus. 3. Considerable diverticulosis of the transverse, descending, and proximal sigmoid colon. 4. Left inguinal hernia contains  adipose tissue. 5. 6 mm hypodense lesion in segment 6 of the liver, not appreciably changed from 1 year ago, technically too small to characterize although statistically likely to be benign.   Electronically Signed   By: Van Clines M.D.   On: 08/19/2014 20:24

## 2014-09-07 NOTE — Assessment & Plan Note (Addendum)
Edward Harris is a 39 y.o. y/o male with recurrent/refractory sigmoid diverticulitis. Initial episode was last year, but he never had a followup colonoscopy. Failed cipro & flagyl last month, then completed Levaquin & flagyl.  He is feeling better.  Colonoscopy with Dr Allen Norris in a few weeks. I have discussed risks & benefits which include, but are not limited to, bleeding, infection, perforation & drug reaction.  The patient agrees with this plan & written consent will be obtained.     Low fiber diet for 2 more weeks, then High fiber diet-handout given Call if fever, diarrhea or severe pain returns May return to work

## 2014-09-07 NOTE — Patient Instructions (Signed)
Call if abdominal pain, vomiting, fever or go to ER   Low-Fiber Diet until end of June Fiber is found in fruits, vegetables, and whole grains. A low-fiber diet restricts fibrous foods that are not digested in the small intestine. A diet containing about 10-15 grams of fiber per day is considered low fiber. Low-fiber diets may be used to:  Promote healing and rest the bowel during intestinal flare-ups.  Prevent blockage of a partially obstructed or narrowed gastrointestinal tract.  Reduce fecal weight and volume.  Slow the movement of feces. You may be on a low-fiber diet as a transitional diet following surgery, after an injury (trauma), or because of a short (acute) or lifelong (chronic) illness. Your health care provider will determine the length of time you need to stay on this diet.  WHAT DO I NEED TO KNOW ABOUT A LOW-FIBER DIET? Always check the fiber content on the packaging's Nutrition Facts label, especially on foods from the grains list. Ask your dietitian if you have questions about specific foods that are related to your condition, especially if the food is not listed below. In general, a low-fiber food will have less than 2 g of fiber. WHAT FOODS CAN I EAT? Grains All breads and crackers made with white flour. Sweet rolls, doughnuts, waffles, pancakes, [place] toast, bagels. Pretzels, Melba toast, zwieback. Well-cooked cereals, such as cornmeal, farina, or cream cereals. Dry cereals that do not contain whole grains, fruit, or nuts, such as refined corn, wheat, rice, and oat cereals. Potatoes prepared any way without skins, plain pastas and noodles, refined white rice. Use white flour for baking and making sauces. Use allowed list of grains for casseroles, dumplings, and puddings.  Vegetables Strained tomato and vegetable juices. Fresh lettuce, cucumber, spinach. Well-cooked (no skin or pulp) or canned vegetables, such as asparagus, bean sprouts, beets, carrots, green beans, mushrooms,  potatoes, pumpkin, spinach, yellow squash, tomato sauce/puree, turnips, yams, and zucchini. Keep servings limited to  cup.  Fruits All fruit juices except prune juice. Cooked or canned fruits without skin and seeds, such as applesauce, apricots, cherries, fruit cocktail, grapefruit, grapes, mandarin oranges, melons, peaches, pears, pineapple, and plums. Fresh fruits without skin, such as apricots, avocados, bananas, melons, pineapple, nectarines, and peaches. Keep servings limited to  cup or 1 piece.  Meat and Other Protein Sources Ground or well-cooked tender beef, ham, veal, lamb, pork, or poultry. Eggs, plain cheese. Fish, oysters, shrimp, lobster, and other seafood. Liver, organ meats. Smooth nut butters. Dairy All milk products and alternative dairy substitutes, such as soy, rice, almond, and coconut, not containing added whole nuts, seeds, or added fruit. Beverages Decaf coffee, fruit, and vegetable juices or smoothies (small amounts, with no pulp or skins, and with fruits from allowed list), sports drinks, herbal tea. Condiments Ketchup, mustard, vinegar, cream sauce, cheese sauce, cocoa powder. Spices in moderation, such as allspice, basil, bay leaves, celery powder or leaves, cinnamon, cumin powder, curry powder, ginger, mace, marjoram, onion or garlic powder, oregano, paprika, parsley flakes, ground pepper, rosemary, sage, savory, tarragon, thyme, and turmeric. Sweets and Desserts Plain cakes and cookies, pie made with allowed fruit, pudding, custard, cream pie. Gelatin, fruit, ice, sherbet, frozen ice pops. Ice cream, ice milk without nuts. Plain hard candy, honey, jelly, molasses, syrup, sugar, chocolate syrup, gumdrops, marshmallows. Limit overall sugar intake.  Fats and Oil Margarine, butter, cream, mayonnaise, salad oils, plain salad dressings made from allowed foods. Choose healthy fats such as olive oil, canola oil, and omega-3 fatty acids (such  as found in salmon or tuna) when  possible.  Other Bouillon, broth, or cream soups made from allowed foods. Any strained soup. Casseroles or mixed dishes made with allowed foods. The items listed above may not be a complete list of recommended foods or beverages. Contact your dietitian for more options.  WHAT FOODS ARE NOT RECOMMENDED? Grains All whole wheat and whole grain breads and crackers. Multigrains, rye, bran seeds, nuts, or coconut. Cereals containing whole grains, multigrains, bran, coconut, nuts, raisins. Cooked or dry oatmeal, steel-cut oats. Coarse wheat cereals, granola. Cereals advertised as high fiber. Potato skins. Whole grain pasta, wild or brown rice. Popcorn. Coconut flour. Bran, buckwheat, corn bread, multigrains, rye, wheat germ.  Vegetables Fresh, cooked or canned vegetables, such as artichokes, asparagus, beet greens, broccoli, Brussels sprouts, cabbage, celery, cauliflower, corn, eggplant, kale, legumes or beans, okra, peas, and tomatoes. Avoid large servings of any vegetables, especially raw vegetables.  Fruits Fresh fruits, such as apples with or without skin, berries, cherries, figs, grapes, grapefruit, guavas, kiwis, mangoes, oranges, papayas, pears, persimmons, pineapple, and pomegranate. Prune juice and juices with pulp, stewed or dried prunes. Dried fruits, dates, raisins. Fruit seeds or skins. Avoid large servings of all fresh fruits. Meats and Other Protein Sources Tough, fibrous meats with gristle. Chunky nut butter. Cheese made with seeds, nuts, or other foods not recommended. Nuts, seeds, legumes (beans, including baked beans), dried peas, beans, lentils.  Dairy Yogurt or cheese that contains nuts, seeds, or added fruit.  Beverages Fruit juices with high pulp, prune juice. Caffeinated coffee and teas.  Condiments Coconut, maple syrup, pickles, olives. Sweets and Desserts Desserts, cookies, or candies that contain nuts or coconut, chunky peanut butter, dried fruits. Jams, preserves with seeds,  marmalade. Large amounts of sugar and sweets. Any other dessert made with fruits from the not recommended list.  Other Soups made from vegetables that are not recommended or that contain other foods not recommended.  The items listed above may not be a complete list of foods and beverages to avoid. Contact your dietitian for more information. Document Released: 09/12/2001 Document Revised: 03/28/2013 Document Reviewed: 02/13/2013 [facility] Patient Information 2015 [place], [place]. This information is not intended to replace advice given to you by your health care provider. Make sure you discuss any questions you have with your health care provider.     High-Fiber Diet (start end of June) Fiber is found in fruits, vegetables, and grains. A high-fiber diet encourages the addition of more whole grains, legumes, fruits, and vegetables in your diet. The recommended amount of fiber for adult males is 38 g per day. For adult females, it is 25 g per day. Pregnant and lactating women should get 28 g of fiber per day. If you have a digestive or bowel problem, ask your caregiver for advice before adding high-fiber foods to your diet. Eat a variety of high-fiber foods instead of only a select few type of foods.  PURPOSE  To increase stool bulk.  To make bowel movements more regular to prevent constipation.  To lower cholesterol.  To prevent overeating. WHEN IS THIS DIET USED?  It may be used if you have constipation and hemorrhoids.  It may be used if you have uncomplicated diverticulosis (intestine condition) and irritable bowel syndrome.  It may be used if you need help with weight management.  It may be used if you want to add it to your diet as a protective measure against atherosclerosis, diabetes, and cancer. SOURCES OF FIBER  Whole-grain breads  and cereals.  Fruits, such as apples, oranges, bananas, berries, prunes, and pears.  Vegetables, such as green peas, carrots, sweet potatoes,  beets, broccoli, cabbage, spinach, and artichokes.  Legumes, such split peas, soy, lentils.  Almonds. FIBER CONTENT IN FOODS Starches and Grains / Dietary Fiber (g)  Cheerios, 1 cup / 3 g  Corn Flakes cereal, 1 cup / 0.7 g  Rice crispy treat cereal, 1 cup / 0.3 g  Instant oatmeal (cooked),  cup / 2 g  Frosted wheat cereal, 1 cup / 5.1 g  Brown, long-grain rice (cooked), 1 cup / 3.5 g  White, long-grain rice (cooked), 1 cup / 0.6 g  Enriched macaroni (cooked), 1 cup / 2.5 g Legumes / Dietary Fiber (g)  Baked beans (canned, plain, or vegetarian),  cup / 5.2 g  Kidney beans (canned),  cup / 6.8 g  Pinto beans (cooked),  cup / 5.5 g Breads and Crackers / Dietary Fiber (g)  Plain or honey graham crackers, 2 squares / 0.7 g  Saltine crackers, 3 squares / 0.3 g  Plain, salted pretzels, 10 pieces / 1.8 g  Whole-wheat bread, 1 slice / 1.9 g  White bread, 1 slice / 0.7 g  Raisin bread, 1 slice / 1.2 g  Plain bagel, 3 oz / 2 g  Flour tortilla, 1 oz / 0.9 g  Corn tortilla, 1 small / 1.5 g  Hamburger or hotdog bun, 1 small / 0.9 g Fruits / Dietary Fiber (g)  Apple with skin, 1 medium / 4.4 g  Sweetened applesauce,  cup / 1.5 g  Banana,  medium / 1.5 g  Grapes, 10 grapes / 0.4 g  Orange, 1 small / 2.3 g  Raisin, 1.5 oz / 1.6 g  Melon, 1 cup / 1.4 g Vegetables / Dietary Fiber (g)  Green beans (canned),  cup / 1.3 g  Carrots (cooked),  cup / 2.3 g  Broccoli (cooked),  cup / 2.8 g  Peas (cooked),  cup / 4.4 g  Mashed potatoes,  cup / 1.6 g  Lettuce, 1 cup / 0.5 g  Corn (canned),  cup / 1.6 g  Tomato,  cup / 1.1 g Document Released: 03/23/2005 Document Revised: 09/22/2011 Document Reviewed: 06/25/2011 [employer] Patient Information 2015 [place], [place]. This information is not intended to replace advice given to you by your health care provider. Make sure you discuss any questions you have with your health care  provider. Diverticulitis Diverticulitis is inflammation or infection of small pouches in your colon that form when you have a condition called diverticulosis. The pouches in your colon are called diverticula. Your colon, or large intestine, is where water is absorbed and stool is formed. Complications of diverticulitis can include:  Bleeding.  Severe infection.  Severe pain.  Perforation of your colon.  Obstruction of your colon. CAUSES  Diverticulitis is caused by bacteria. Diverticulitis happens when stool becomes trapped in diverticula. This allows bacteria to grow in the diverticula, which can lead to inflammation and infection. RISK FACTORS People with diverticulosis are at risk for diverticulitis. Eating a diet that does not include enough fiber from fruits and vegetables may make diverticulitis more likely to develop. SYMPTOMS  Symptoms of diverticulitis may include:  Abdominal pain and tenderness. The pain is normally located on the left side of the abdomen, but may occur in other areas.  Fever and chills.  Bloating.  Cramping.  Nausea.  Vomiting.  Constipation.  Diarrhea.  Blood in your stool. DIAGNOSIS  Your  health care provider will ask you about your medical history and do a physical exam. You may need to have tests done because many medical conditions can cause the same symptoms as diverticulitis. Tests may include:  Blood tests.  Urine tests.  Imaging tests of the abdomen, including X-rays and CT scans. When your condition is under control, your health care provider may recommend that you have a colonoscopy. A colonoscopy can show how severe your diverticula are and whether something else is causing your symptoms. TREATMENT  Most cases of diverticulitis are mild and can be treated at home. Treatment may include:  Taking over-the-counter pain medicines.  Following a clear liquid diet.  Taking antibiotic medicines by mouth for 7-10 days. More severe  cases may be treated at a hospital. Treatment may include:  Not eating or drinking.  Taking prescription pain medicine.  Receiving antibiotic medicines through an IV tube.  Receiving fluids and nutrition through an IV tube.  Surgery. HOME CARE INSTRUCTIONS   Follow your health care provider's instructions carefully.  Follow a full liquid diet or other diet as directed by your health care provider. After your symptoms improve, your health care provider may tell you to change your diet. He or she may recommend you eat a high-fiber diet. Fruits and vegetables are good sources of fiber. Fiber makes it easier to pass stool.  Take fiber supplements or probiotics as directed by your health care provider.  Only take medicines as directed by your health care provider.  Keep all your follow-up appointments. SEEK MEDICAL CARE IF:   Your pain does not improve.  You have a hard time eating food.  Your bowel movements do not return to normal. SEEK IMMEDIATE MEDICAL CARE IF:   Your pain becomes worse.  Your symptoms do not get better.  Your symptoms suddenly get worse.  You have a fever.  You have repeated vomiting.  You have bloody or black, tarry stools. MAKE SURE YOU:   Understand these instructions.  Will watch your condition.  Will get help right away if you are not doing well or get worse. Document Released: 12/31/2004 Document Revised: 03/28/2013 Document Reviewed: 02/15/2013 [facility] Patient Information 2015 [place], [place]. This information is not intended to replace advice given to you by your health care provider. Make sure you discuss any questions you have with your health care provider.

## 2014-09-09 ENCOUNTER — Encounter: Payer: Self-pay | Admitting: Urgent Care

## 2014-09-19 ENCOUNTER — Telehealth: Payer: Self-pay

## 2014-09-19 ENCOUNTER — Other Ambulatory Visit: Payer: Self-pay

## 2014-09-19 DIAGNOSIS — K5732 Diverticulitis of large intestine without perforation or abscess without bleeding: Secondary | ICD-10-CM

## 2014-09-19 NOTE — Telephone Encounter (Addendum)
Scheduled CT for: 09/20/14 at [facility] - arrival 0815. No solids 4 hours prior to exam.   Called patient's wife back at this time and explained Procedure, Time, Date, Location, and Prep. She verbalizes understanding.  Explained to patient's wife that I will have disability note for today and tomorrow excusing patient from work until CT scan is completed that she may pick up at office.

## 2014-09-19 NOTE — Telephone Encounter (Signed)
Patient's wife calls in and states that patient has been having Nausea, Vomiting, Constipation, and Generalized Abdominal Pain. Hx of Diverticulitis.  Please advise.

## 2014-09-19 NOTE — Addendum Note (Signed)
Addended by: Andria Meuse on: 09/19/2014 02:01 PM   Modules accepted: Orders

## 2014-09-20 ENCOUNTER — Telehealth: Payer: Self-pay

## 2014-09-20 ENCOUNTER — Ambulatory Visit
Admission: RE | Admit: 2014-09-20 | Discharge: 2014-09-20 | Disposition: A | Discharge status: Home / Self Care | Payer: Managed Care, Other (non HMO) | Source: Ambulatory Visit | Attending: Urgent Care | Admitting: Urgent Care

## 2014-09-20 DIAGNOSIS — K5732 Diverticulitis of large intestine without perforation or abscess without bleeding: Secondary | ICD-10-CM | POA: Diagnosis present

## 2014-09-20 DIAGNOSIS — K5790 Diverticulosis of intestine, part unspecified, without perforation or abscess without bleeding: Secondary | ICD-10-CM | POA: Insufficient documentation

## 2014-09-20 MED ORDER — IOHEXOL 350 MG/ML SOLN
100.0000 mL | Freq: Once | INTRAVENOUS | Status: AC | PRN
  Administered 2014-09-20: 100 mL via INTRAVENOUS

## 2014-09-20 NOTE — Telephone Encounter (Signed)
Edward Huger, NP has reviewed CT and would like for me to call patient to give negative results and to continue with planned Colonoscopy.  Explained this to patient and wife, [name]. Verbalizes understanding.  Pt wanted work note to to be out until Colonoscopy is done on 6/29. Explained that I was unable to do this but he could go to PCP or ER with any further dizziness or near syncope.   Pt does have nausea medication. Encouraged pt to call if he needs more nausea medication prior to scope. Explained BRAT diet to help with nausea and asked to call or go to ED with severe pain. Pt agreed.

## 2014-09-23 ENCOUNTER — Encounter: Payer: Self-pay | Admitting: *Deleted

## 2014-09-23 ENCOUNTER — Emergency Department
Admission: EM | Admit: 2014-09-23 | Discharge: 2014-09-24 | Disposition: A | Discharge status: Home / Self Care | Payer: Managed Care, Other (non HMO) | Attending: Emergency Medicine | Admitting: Emergency Medicine

## 2014-09-23 DIAGNOSIS — R112 Nausea with vomiting, unspecified: Secondary | ICD-10-CM | POA: Insufficient documentation

## 2014-09-23 DIAGNOSIS — I10 Essential (primary) hypertension: Secondary | ICD-10-CM | POA: Diagnosis not present

## 2014-09-23 DIAGNOSIS — Z87891 Personal history of nicotine dependence: Secondary | ICD-10-CM | POA: Diagnosis not present

## 2014-09-23 DIAGNOSIS — R109 Unspecified abdominal pain: Secondary | ICD-10-CM | POA: Diagnosis not present

## 2014-09-23 LAB — URINALYSIS COMPLETE WITH MICROSCOPIC (ARMC ONLY)
BILIRUBIN URINE: NEGATIVE
Glucose, UA: NEGATIVE mg/dL
Hgb urine dipstick: NEGATIVE
Leukocytes, UA: NEGATIVE
Nitrite: NEGATIVE
Protein, ur: 30 mg/dL — AB
SPECIFIC GRAVITY, URINE: 1.026 (ref 1.005–1.030)
pH: 5 (ref 5.0–8.0)

## 2014-09-23 LAB — CBC WITH DIFFERENTIAL/PLATELET
BASOS ABS: 0.1 10*3/uL (ref 0–0.1)
BASOS PCT: 1 %
EOS PCT: 1 %
Eosinophils Absolute: 0.1 10*3/uL (ref 0–0.7)
HEMATOCRIT: 48.1 % (ref 40.0–52.0)
Hemoglobin: 15.8 g/dL (ref 13.0–18.0)
LYMPHS ABS: 3.2 10*3/uL (ref 1.0–3.6)
Lymphocytes Relative: 29 %
MCH: 28.6 pg (ref 26.0–34.0)
MCHC: 32.8 g/dL (ref 32.0–36.0)
MCV: 87.1 fL (ref 80.0–100.0)
Monocytes Absolute: 0.8 10*3/uL (ref 0.2–1.0)
Monocytes Relative: 7 %
Neutro Abs: 6.9 10*3/uL — ABNORMAL HIGH (ref 1.4–6.5)
Neutrophils Relative %: 62 %
PLATELETS: 310 10*3/uL (ref 150–440)
RBC: 5.52 MIL/uL (ref 4.40–5.90)
RDW: 13.7 % (ref 11.5–14.5)
WBC: 11 10*3/uL — AB (ref 3.8–10.6)

## 2014-09-23 LAB — BASIC METABOLIC PANEL
Anion gap: 10 (ref 5–15)
BUN: 13 mg/dL (ref 6–20)
CHLORIDE: 100 mmol/L — AB (ref 101–111)
CO2: 25 mmol/L (ref 22–32)
CREATININE: 1.14 mg/dL (ref 0.61–1.24)
Calcium: 9.6 mg/dL (ref 8.9–10.3)
GFR calc Af Amer: >60 mL/min (ref >60)
Glucose, Bld: 103 mg/dL — ABNORMAL HIGH (ref 65–99)
Potassium: 3.6 mmol/L (ref 3.5–5.1)
Sodium: 135 mmol/L (ref 135–145)

## 2014-09-23 MED ORDER — SODIUM CHLORIDE 0.9 % IV SOLN
Freq: Once | INTRAVENOUS | Status: AC
  Administered 2014-09-23: 23:00:00 via INTRAVENOUS

## 2014-09-23 MED ORDER — RANITIDINE HCL 150 MG PO TABS: 150.0000 mg | ORAL_TABLET | Freq: Every day | ORAL | Status: DC

## 2014-09-23 MED ORDER — METOCLOPRAMIDE HCL 10 MG PO TABS: 10.0000 mg | ORAL_TABLET | Freq: Three times a day (TID) | ORAL | Status: DC | PRN

## 2014-09-23 MED ORDER — PROMETHAZINE HCL 25 MG/ML IJ SOLN
25.0000 mg | Freq: Once | INTRAMUSCULAR | Status: AC
  Administered 2014-09-23: 25 mg via INTRAVENOUS

## 2014-09-23 MED ORDER — SUCRALFATE 1 G PO TABS: 1.0000 g | ORAL_TABLET | Freq: Four times a day (QID) | ORAL | Status: DC

## 2014-09-23 MED ORDER — SODIUM CHLORIDE 0.9 % IV SOLN
Freq: Once | INTRAVENOUS | Status: AC
  Administered 2014-09-23: 20:00:00 via INTRAVENOUS

## 2014-09-23 MED ORDER — PROMETHAZINE HCL 25 MG/ML IJ SOLN
INTRAMUSCULAR | Status: AC
  Administered 2014-09-23: 25 mg via INTRAVENOUS
  Filled 2014-09-23: qty 1

## 2014-09-23 MED ORDER — FAMOTIDINE IN NACL 20-0.9 MG/50ML-% IV SOLN
20.0000 mg | Freq: Once | INTRAVENOUS | Status: AC
  Administered 2014-09-23: 20 mg via INTRAVENOUS

## 2014-09-23 MED ORDER — METOCLOPRAMIDE HCL 5 MG/ML IJ SOLN
INTRAMUSCULAR | Status: AC
  Administered 2014-09-23: 10 mg via INTRAVENOUS
  Filled 2014-09-23: qty 2

## 2014-09-23 MED ORDER — FAMOTIDINE IN NACL 20-0.9 MG/50ML-% IV SOLN
INTRAVENOUS | Status: AC
  Administered 2014-09-23: 20 mg via INTRAVENOUS
  Filled 2014-09-23: qty 50

## 2014-09-23 MED ORDER — METOCLOPRAMIDE HCL 5 MG/ML IJ SOLN
10.0000 mg | Freq: Once | INTRAMUSCULAR | Status: AC
  Administered 2014-09-23: 10 mg via INTRAVENOUS

## 2014-09-23 NOTE — ED Notes (Signed)
Pt with hx of diverticulitis is here for worsening symptoms.  Pt has been having increasing difficulty "keeping any fluids down" for the last 8 days.  Pt denies any abdominal pain but states that this is how his diverticulitis symptoms are

## 2014-09-23 NOTE — Discharge Instructions (Signed)
Cyclic Vomiting Syndrome °Cyclic vomiting syndrome is a benign condition in which patients experience bouts or cycles of severe nausea and vomiting that last for hours or even days. The bouts of nausea and vomiting alternate with longer periods of no symptoms and generally good health. Cyclic vomiting syndrome occurs mostly in children, but can affect adults. °CAUSES  °[facility] has no known cause. Each episode is typically similar to the previous ones. The episodes tend to:  °· Start at about the same time of day. °· Last the same length of time. °· Present the same symptoms at the same level of intensity. °Cyclic vomiting syndrome can begin at any age in children and adults. Cyclic vomiting syndrome usually starts between the ages of 3 and 7 years. In adults, episodes tend to occur less often than they do in children, but they last longer. Furthermore, the events or situations that trigger episodes in adults cannot always be pinpointed as easily as they can in children. °There are 4 phases of cyclic vomiting syndrome: °1. Prodrome. The prodrome phase signals that an episode of nausea and vomiting is about to begin. This phase can last from just a few minutes to several hours. This phase is often marked by belly (abdominal) pain. Sometimes taking medicine early in the prodrome phase can stop an episode in progress. However, sometimes there is no warning. A person may simply wake up in the middle of the night or early morning and begin vomiting. °2. Episode. The episode phase consists of: °· Severe vomiting. °· Nausea. °· Gagging (retching). °3. Recovery. The recovery phase begins when the nausea and vomiting stop. Healthy color, appetite, and energy return. °4. Symptom-free interval. The symptom-free interval phase is the period between episodes when no symptoms are present. °TRIGGERS °Episodes can be triggered by an infection or event. Examples of triggers include: °· Infections. °· Colds, allergies, sinus problems, and  the flu. °· Eating certain foods such as chocolate or cheese. °· Foods with monosodium glutamate (MSG) or preservatives. °· Fast foods. °· Pre-packaged foods. °· Foods with low nutritional value (junk foods). °· Overeating. °· Eating just before going to bed. °· Hot weather. °· Dehydration. °· Not enough sleep or poor sleep quality. °· Physical exhaustion. °· Menstruation. °· Motion sickness. °· Emotional stress (school or home difficulties). °· Excitement or stress. °SYMPTOMS  °The main symptoms of cyclic vomiting syndrome are: °· Severe vomiting. °· Nausea. °· Gagging (retching). °Episodes usually begin at night or the first thing in the morning. Episodes may include vomiting or retching up to 5 or 6 times an hour during the worst of the episode. Episodes usually last anywhere from 1 to 4 days. Episodes can last for up to 10 days. Other symptoms include: °· Paleness. °· Exhaustion. °· Listlessness. °· Abdominal pain. °· Loose stools or diarrhea. °Sometimes the nausea and vomiting are so severe that a person appears to be almost unconscious. Sensitivity to light, headache, fever, dizziness, may also accompany an episode. In addition, the vomiting may cause drooling and excessive thirst. Drinking water usually leads to more vomiting, though the water can dilute the acid in the vomit, making the episode a little less painful. Continuous vomiting can lead to dehydration, which means that the body has lost excessive water and salts. °DIAGNOSIS  °Cyclic vomiting syndrome is hard to diagnose because there are no clear tests to identify it. A caregiver must diagnose cyclic vomiting syndrome by looking at symptoms and medical history. A caregiver must exclude more common diseases   or disorders that can also cause nausea and vomiting. Also, diagnosis takes time because caregivers need to identify a pattern or cycle to the vomiting. °TREATMENT  °Cyclic vomiting syndrome cannot be cured. Treatment varies, but people with  cyclic vomiting syndrome should get plenty of rest and sleep and take medications that prevent, stop, or lessen the vomiting episodes and other symptoms. °People whose episodes are frequent and long-lasting may be treated during the symptom-free intervals in an effort to prevent or ease future episodes. The symptom-free phase is a good time to eliminate anything known to trigger an episode. For example, if episodes are brought on by stress or excitement, this period is the time to find ways to reduce stress and stay calm. If sinus problems or allergies cause episodes, those conditions should be treated. The triggers listed above should be avoided or prevented. °Because of the similarities between migraine and cyclic vomiting syndrome, caregivers treat some people with severe cyclic vomiting syndrome with drugs that are also used for migraine headaches. The drugs are designed to: °· Prevent episodes. °· Reduce their frequency. °· Lessen their severity. °HOME CARE INSTRUCTIONS °Once a vomiting episode begins, treatment is supportive. It helps to stay in bed and sleep in a dark, quiet room. Severe nausea and vomiting may require hospitalization and intravenous (IV) fluids to prevent dehydration. Relaxing medications (sedatives) may help if the nausea continues. Sometimes, during the prodrome phase, it is possible to stop an episode from happening altogether. Only take over-the-counter or prescription medicines for pain, discomfort or fever as directed by your caregiver. Do not give aspirin to children. °During the recovery phase, drinking water and replacing lost electrolytes (salts in the blood) are very important. Electrolytes are salts that the body needs to function well and stay healthy. Symptoms during the recovery phase can vary. Some people find that their appetites return to normal immediately, while others need to begin by drinking clear liquids and then move slowly to solid food. °RELATED COMPLICATIONS °The  severe vomiting that defines cyclic vomiting syndrome is a risk factor for several complications: °· Dehydration--Vomiting causes the body to lose water quickly. °· Electrolyte imbalance--Vomiting also causes the body to lose the important salts it needs to keep working properly. °· Peptic esophagitis--The tube that connects the mouth to the stomach (esophagus) becomes injured from the stomach acid that comes up with the vomit. °· Hematemesis--The esophagus becomes irritated and bleeds, so blood mixes with the vomit. °· Mallory-Weiss tear--The lower end of the esophagus may tear open or the stomach may bruise from vomiting or retching. °· Tooth decay--The acid in the vomit can hurt the teeth by corroding the tooth enamel. °SEEK MEDICAL CARE IF: °You have questions or problems. °Document Released: 06/01/2001 Document Revised: 06/15/2011 Document Reviewed: 06/30/2010 °[employer]® Patient Information ©2015 [employer]. This information is not intended to replace advice given to you by your health care provider. Make sure you discuss any questions you have with your health care provider. ° °

## 2014-09-23 NOTE — ED Provider Notes (Signed)
[facility] Emergency Department Provider Note     Time seen: ----------------------------------------- 6:48 PM on 09/23/2014 -----------------------------------------    I have reviewed the triage vital signs and the nursing notes.   HISTORY  Chief Complaint Emesis    HPI Edward Harris is a 39 y.o. male who presents ER for recent diverticulitis with worsening symptoms. Patient states having increasing difficulty keeping any liquids down for the last several days. Patient denies any abdominal pain states this is how his diverticulitis symptoms are. Does work outside in the heat, thinks he may be dehydrated. He again currently denies any pain but this had recent nausea vomiting and poor by mouth tolerance. Mild this time   Past Medical History  Diagnosis Date  . Diverticulitis   . Hypertension   . IBS (irritable bowel syndrome)   . Nephrolithiasis     Patient Active Problem List   Diagnosis Date Noted  . Diverticulitis large intestine w/o perforation or abscess w/o bleeding 09/07/2014  . Diverticulitis 08/19/2014  . Ileus 08/19/2014  . Hypertension 08/19/2014    Past Surgical History  Procedure Laterality Date  . Inguinal hernia repair Right   . Kidney stone surgery      extraction/lithotripsy    Allergies Amlodipine  Social History History  Substance Use Topics  . Smoking status: Former Research scientist (life sciences)  . Smokeless tobacco: Not on file  . Alcohol Use: 0.0 oz/week    0 Standard drinks or equivalent per week     Comment: "occassional" couple drinks per month    Review of Systems Constitutional: Negative for fever. Eyes: Negative for visual changes. ENT: Negative for sore throat. Cardiovascular: Negative for chest pain. Respiratory: Negative for shortness of breath. Gastrointestinal: Positive for recent abdominal pain and vomiting Genitourinary: Negative for dysuria. Musculoskeletal: Negative for back pain. Skin: Negative for  rash. Neurological: Negative for headaches, focal weakness or numbness.  10-point ROS otherwise negative.  ____________________________________________   PHYSICAL EXAM:  VITAL SIGNS: ED Triage Vitals  Enc Vitals Group     BP 09/23/14 1757 155/93 mmHg     Pulse Rate 09/23/14 1757 88     Resp 09/23/14 1757 22     Temp 09/23/14 1757 99.5 F (37.5 C)     Temp Source 09/23/14 1757 Oral     SpO2 09/23/14 1757 99 %     Weight 09/23/14 1757 220 lb (99.791 kg)     Height --      Head Cir --      Peak Flow --      Pain Score 09/23/14 1757 0     Pain Loc --      Pain Edu? --      Excl. in [place]? --     Constitutional: Alert and oriented. Well appearing and in no distress. Eyes: Conjunctivae are normal. PERRL. Normal extraocular movements. ENT   Head: Normocephalic and atraumatic.   Nose: No congestion/rhinnorhea.   Mouth/Throat: Mucous membranes are moist.   Neck: No stridor. Hematological/Lymphatic/Immunilogical: No cervical lymphadenopathy. Cardiovascular: Normal rate, regular rhythm. Normal and symmetric distal pulses are present in all extremities. No murmurs, rubs, or gallops. Respiratory: Normal respiratory effort without tachypnea nor retractions. Breath sounds are clear and equal bilaterally. No wheezes/rales/rhonchi. Gastrointestinal: Soft and nontender. No distention. No abdominal bruits. There is no CVA tenderness. Musculoskeletal: Nontender with normal range of motion in all extremities. No joint effusions.  No lower extremity tenderness nor edema. Neurologic:  Normal speech and language. No gross focal neurologic deficits are  appreciated. Speech is normal. No gait instability. Skin:  Skin is warm, dry and intact. No rash noted. Psychiatric: Mood and affect are normal. Speech and behavior are normal. Patient exhibits appropriate insight and judgment. ____________________________________________  ED COURSE:  Pertinent labs & imaging results that were  available during my care of the patient were reviewed by me and considered in my medical decision making (see chart for details). Patient will receive IV fluid hydration and will recheck basic labs and review recent CT scan ____________________________________________    LABS (pertinent positives/negatives)  Labs Reviewed  CBC WITH DIFFERENTIAL/PLATELET - Abnormal; Notable for the following:    WBC 11.0 (*)    Neutro Abs 6.9 (*)    All other components within normal limits  BASIC METABOLIC PANEL - Abnormal; Notable for the following:    Chloride 100 (*)    Glucose, Bld 103 (*)    All other components within normal limits  URINALYSIS COMPLETEWITH MICROSCOPIC ([facility] ONLY) - Abnormal; Notable for the following:    Color, Urine AMBER (*)    APPearance HAZY (*)    Ketones, ur TRACE (*)    Protein, ur 30 (*)    Bacteria, UA RARE (*)    Squamous Epithelial / LPF 0-5 (*)    All other components within normal limits    RADIOLOGY  None  ____________________________________________  FINAL ASSESSMENT AND PLAN  Vomiting  Plan: Clear etiology for the patient's symptoms is identified. Recent CT scan was reviewed and was unremarkable. Patient is follow-up with GI or this month for colonoscopy, we'll try him on Reglan at home Cipro can improve his persistent vomiting.   Earleen Newport, MD   Earleen Newport, MD 09/23/14 2117

## 2014-09-24 ENCOUNTER — Telehealth: Payer: Self-pay | Admitting: Urgent Care

## 2014-09-24 NOTE — Telephone Encounter (Signed)
Please call patients wife, [name], patient had to return to the ER yesterday because he could not stop vomiting. He is still unable to keep anything down and he has an upcoming colonoscopy (29th) - they would like some advice on his diet since he is unable to tolerate anything.

## 2014-09-24 NOTE — Telephone Encounter (Signed)
Spoke with [name] (patient's wife) at this time. Explained that Brennan Bailey would review records from ER visit this weekend and call patient back after this has been reviewed.   Wife is insisting that patient be placed out of work as Vomiting is causing dizziness and patient has to be out in heat to do his job and she is concerned for his safety. She would like short-term disability paperwork filled out until Colonoscopy can be completed.

## 2014-09-24 NOTE — Telephone Encounter (Signed)
Spoke with patient and patient's wife at this time. Placed patient on schedule on 09/26/14 at 0930. Explained that we could place patient out of work from Wednesday 6/15 to Wednesday 6/22. Asked wife to have [employer] send over update form on current disability claim.

## 2014-09-24 NOTE — Telephone Encounter (Signed)
Pt will need appt to discuss as he will not be able to complete colonoscopy prep with persistent vomiting. Thanks

## 2014-09-26 ENCOUNTER — Telehealth: Payer: Self-pay | Admitting: Urgent Care

## 2014-09-26 ENCOUNTER — Encounter: Payer: Self-pay | Admitting: Anesthesiology

## 2014-09-26 ENCOUNTER — Ambulatory Visit (INDEPENDENT_AMBULATORY_CARE_PROVIDER_SITE_OTHER): Payer: Managed Care, Other (non HMO) | Admitting: Urgent Care

## 2014-09-26 ENCOUNTER — Encounter: Payer: Self-pay | Admitting: Urgent Care

## 2014-09-26 ENCOUNTER — Encounter: Payer: Self-pay | Admitting: *Deleted

## 2014-09-26 VITALS — BP 142/95 | HR 71 | Temp 98.3°F | Ht 70.0 in | Wt 215.0 lb

## 2014-09-26 DIAGNOSIS — K5732 Diverticulitis of large intestine without perforation or abscess without bleeding: Secondary | ICD-10-CM

## 2014-09-26 DIAGNOSIS — R1115 Cyclical vomiting syndrome unrelated to migraine: Secondary | ICD-10-CM | POA: Insufficient documentation

## 2014-09-26 DIAGNOSIS — G43A0 Cyclical vomiting, not intractable: Secondary | ICD-10-CM | POA: Diagnosis not present

## 2014-09-26 MED ORDER — PANTOPRAZOLE SODIUM 40 MG PO TBEC: 40.0000 mg | DELAYED_RELEASE_TABLET | Freq: Every day | ORAL | Status: DC

## 2014-09-26 NOTE — Assessment & Plan Note (Signed)
Resolved Upcoming colonoscopy

## 2014-09-26 NOTE — Telephone Encounter (Signed)
Spoke with patient at this time. Explained that Edward Harris had just advised the patient to avoid spicy and fatty foods to try to avoid exacerbating his symptoms. We discussed which types of foods to avoid. Answered all questions that patient had. Will call with any other questions or concerns. Patient and wife both verbalized understanding of conversation.

## 2014-09-26 NOTE — Telephone Encounter (Signed)
Please call patient, or his wife - they have questions about his diet that was suggested by Brennan Bailey during his office visit today.

## 2014-09-26 NOTE — Assessment & Plan Note (Addendum)
New problem.  Differentials include gastritis, PUD or food borne illness.  Symptoms improved with supportive measures.  Pt unable to work due to heat & anorexia.   Begin pantoprazole 40 mg daily Stop Zantac We will add an EGD (upper endoscopy) to colonoscopy planned.  I have discussed risks & benefits which include, but are not limited to, bleeding, infection, perforation & drug reaction.  The patient agrees with this plan & written consent will be obtained.   Continue Carafate 1 g 4 times daily until 2 days before your procedure Continue Reglan every 8 hrs for nausea Out of work through 10/03/14

## 2014-09-26 NOTE — Progress Notes (Signed)
Primary Care Physician: Madelyn Brunner, MD Primary Gastroenterologist:  Dr Allen Norris  Chief Complaint  Patient presents with  . Vomiting    HPI: Edward Harris is a 39 y.o. male here for follow up of vomiting.  Pt was seen in ER.  Pt has had recent diverticulitis & has colonoscopy was already scheduled next week with Dr Allen Norris.  He went to ER after eating a salad with feta cheese at [place] on 6/11 & he had nausea & vomiting.  N/V persisted after most recent CT scan below.  Everything he ate or drank did not stay down.  Appetite is not well.  He was working 8-9 hr shifts without eating & became dehydrated.  He delivers sheet rock.  He has constant nausea all day long.  Last emesis was in ER Sunday.  His nausea has improved with carafate, reglan OTC TID & zantac.  Pt denies fever or chills.  Denies any abdominal pain.  Pt denies diarrhea,  He is having BMS daily.  Denies rectal bleeding or melena.  CBC Latest Ref Rng 09/23/2014 08/20/2014 08/19/2014  WBC 3.8 - 10.6 K/uL 11.0(H) 9.0 9.6  Hemoglobin 13.0 - 18.0 g/dL 15.8 13.7 15.6  Hematocrit 40.0 - 52.0 % 48.1 41.4 48.4  Platelets 150 - 440 K/uL 310 222 185   CMP Latest Ref Rng 09/23/2014 08/22/2014 08/20/2014  Glucose 65 - 99 mg/dL 103(H) - 104(H)  BUN 6 - 20 mg/dL 13 - 9  Creatinine 0.61 - 1.24 mg/dL 1.14 - 0.87  Sodium 135 - 145 mmol/L 135 - 137  Potassium 3.5 - 5.1 mmol/L 3.6 3.6 3.3(L)  Chloride 101 - 111 mmol/L 100(L) - 104  CO2 22 - 32 mmol/L 25 - 27  Calcium 8.9 - 10.3 mg/dL 9.6 - 8.6(L)  Total Protein 6.5 - 8.1 g/dL - - -  Total Bilirubin 0.3 - 1.2 mg/dL - - -  Alkaline Phos 38 - 126 U/L - - -  AST 15 - 41 U/L - - -  ALT 17 - 63 U/L - - -   Lipase     Component Value Date/Time   LIPASE 38 08/19/2014 1751   Current Outpatient Prescriptions  Medication Sig Dispense Refill  . losartan-hydrochlorothiazide (HYZAAR) 100-25 MG per tablet Take 1 tablet by mouth daily.  3  . metoCLOPramide (REGLAN) 10 MG tablet Take 1 tablet (10 mg  total) by mouth every 8 (eight) hours as needed for nausea or vomiting. (Patient taking differently: Take 10 mg by mouth 3 (three) times daily. ) 30 tablet 1  . ranitidine (ZANTAC) 150 MG tablet Take 150 mg by mouth at bedtime.    . sucralfate (CARAFATE) 1 G tablet Take 1 tablet (1 g total) by mouth 4 (four) times daily. 120 tablet 1  . pantoprazole (PROTONIX) 40 MG tablet Take 1 tablet (40 mg total) by mouth daily. 31 tablet 2   No current facility-administered medications for this visit.    Allergies as of 09/26/2014 - Review Complete 09/26/2014  Allergen Reaction Noted  . Amlodipine Other (See Comments) 08/19/2014    Review of Systems: Gen: Denies any fever, chills, fatigue, malaise ENT: Negative for hoarseness, difficulty swallowing , nasal congestion CV: Denies chest pain, angina, palpitations, syncope, orthopnea, PND, peripheral edema, and claudication. Resp: Denies dyspnea at rest, dyspnea with exercise, cough, sputum, wheezing, coughing up blood, and pleurisy. GI: See HPI GU:  Negative for dysuria, hematuria, urinary incontinence, urinary frequency, nocturnal urination.  Endo: Negative for unusual weight change  or sweats Derm: Denies jaundice, rash, itching, or unhealing ulcers.  Psych: Denies depression, anxiety, memory loss, suicidal ideation, hallucinations, paranoia, and confusion. Heme: Denies bruising, bleeding, and enlarged lymph nodes.   Physical Examination:  BP 142/95 mmHg  Pulse 71  Temp(Src) 98.3 F (36.8 C) (Oral)  Ht 5\' 10"  (1.778 m)  Wt 215 lb (97.523 kg)  BMI 30.85 kg/m2 Body mass index is 30.85 kg/(m^2). No LMP for male patient. General:   Alert,  Well-developed, well-nourished, pleasant and cooperative in NAD, accompanied by his wife & brother Head:  Normocephalic and atraumatic. Eyes:  Sclera clear, no icterus.   Conjunctiva pink. Mouth:  No deformity or lesions.  Oropharynx pink & moist. Neck:  Supple; no masses or thyromegaly. Heart:  Regular rate  and rhythm; no murmurs, clicks, rubs,  or gallops. Abdomen:   Normal bowel sounds.  Soft, nontender and nondistended. No masses, hepatosplenomegaly or hernias noted. No guarding or rebound tenderness.   Msk:  Symmetrical without gross deformities. Normal posture. Pulses:  Normal pulses noted. Extremities:  Without clubbing or edema. Neurologic:  Alert and  oriented x3;  grossly normal neurologically. Skin:  Intact without significant lesions or rashes. Cervical Nodes:  No significant cervical adenopathy. Psych:  Alert and cooperative. Normal mood and affect.  Imaging Studies: Ct Abdomen Pelvis W Contrast  09/20/2014   CLINICAL DATA:  Abdominal pain with nausea and vomiting. History diverticulitis  EXAM: CT ABDOMEN AND PELVIS WITH CONTRAST  TECHNIQUE: Multidetector CT imaging of the abdomen and pelvis was performed using the standard protocol following bolus administration of intravenous contrast.  CONTRAST:  120mL OMNIPAQUE IOHEXOL 350 MG/ML SOLN  COMPARISON:  CT abdomen pelvis 08/19/2014  FINDINGS: Lower chest: Lung bases are clear. Negative for infiltrate or effusion  Hepatobiliary: Normal gallbladder and bile ducts. Small hepatic cysts are stable from the prior study. No new hepatic lesion.  Pancreas: Negative  Spleen: Negative  Adrenals/Urinary Tract: Negative. No renal mass or obstruction. No renal calculi. Adrenal glands normal. Mild prostate enlargement. Mild urinary bladder wall thickening unchanged. Calcified lymph node posterior to the right seminal vesicle unchanged.  Stomach/Bowel: Extensive diverticulosis of the left colon beginning at the splenic flexure. Interval improvement in the pericolonic edema in the left lower colon at the level of the iliac crest compatible with resolving diverticulitis. 1 cm mesenteric lymph nodes adjacent to this area are stable. No abscess. No free fluid or free air. No new areas of acute diverticulitis.  Mobile cecum to the left of midline.  Normal appendix.   Vascular/Lymphatic: Negative  Reproductive: Prostate enlargement.  Other: Prior right inguinal hernia repair. Left inguinal hernia containing fat is unchanged.  Musculoskeletal: Mild degenerative change L4-5 and L5-S1 with disc degeneration and spurring. No fracture.  IMPRESSION: Extensive diverticulosis of the left colon. Interval improvement in diverticulitis of the lower left colon compared with the recent study. No abscess.  Mobile cecum with normal appendix   Electronically Signed   By: Franchot Gallo M.D.   On: 09/20/2014 09:31

## 2014-09-26 NOTE — Patient Instructions (Signed)
Begin pantoprazole 40 mg daily Stop Zantac We will add an EGD (upper endoscopy) to your colonoscopy planned Continue Carafate 1 g 4 times daily until 2 days before your procedure Continue Reglan every 8 hrs for nausea Out of work through 10/03/14  Food Choices for Gastroesophageal Reflux Disease When you have gastroesophageal reflux disease (GERD), the foods you eat and your eating habits are very important. Choosing the right foods can help ease the discomfort of GERD. WHAT GENERAL GUIDELINES DO I NEED TO FOLLOW?  Choose fruits, vegetables, whole grains, low-fat dairy products, and low-fat meat, fish, and poultry.  Limit fats such as oils, salad dressings, butter, nuts, and avocado.  Keep a food diary to identify foods that cause symptoms.  Avoid foods that cause reflux. These may be different for different people.  Eat frequent small meals instead of three large meals each day.  Eat your meals slowly, in a relaxed setting.  Limit fried foods.  Cook foods using methods other than frying.  Avoid drinking alcohol.  Avoid drinking large amounts of liquids with your meals.  Avoid bending over or lying down until 2-3 hours after eating. WHAT FOODS ARE NOT RECOMMENDED? The following are some foods and drinks that may worsen your symptoms: Vegetables Tomatoes. Tomato juice. Tomato and spaghetti sauce. Chili peppers. Onion and garlic. Horseradish. Fruits Oranges, grapefruit, and lemon (fruit and juice). Meats High-fat meats, fish, and poultry. This includes hot dogs, ribs, ham, sausage, salami, and bacon. Dairy Whole milk and chocolate milk. Sour cream. Cream. Butter. Ice cream. Cream cheese.  Beverages Coffee and tea, with or without caffeine. Carbonated beverages or energy drinks. Condiments Hot sauce. Barbecue sauce.  Sweets/Desserts Chocolate and cocoa. Donuts. Peppermint and spearmint. Fats and Oils High-fat foods, including [place] fries and potato  chips. Other Vinegar. Strong spices, such as black pepper, white pepper, red pepper, cayenne, curry powder, cloves, ginger, and chili powder. The items listed above may not be a complete list of foods and beverages to avoid. Contact your dietitian for more information. Document Released: 03/23/2005 Document Revised: 03/28/2013 Document Reviewed: 01/25/2013 [facility] Patient Information 2015 [place], [place]. This information is not intended to replace advice given to you by your health care provider. Make sure you discuss any questions you have with your health care provider.

## 2014-09-27 ENCOUNTER — Encounter: Payer: Self-pay | Admitting: Urgent Care

## 2014-09-27 NOTE — Telephone Encounter (Signed)
error 

## 2014-10-02 NOTE — Discharge Instructions (Signed)

## 2014-10-03 ENCOUNTER — Ambulatory Visit: Payer: Managed Care, Other (non HMO) | Admitting: Anesthesiology

## 2014-10-03 ENCOUNTER — Encounter
Admission: RE | Disposition: A | Discharge status: Home / Self Care | Payer: Self-pay | Source: Ambulatory Visit | Attending: Gastroenterology

## 2014-10-03 ENCOUNTER — Ambulatory Visit
Admission: RE | Admit: 2014-10-03 | Discharge: 2014-10-03 | Disposition: A | Discharge status: Home / Self Care | Payer: Managed Care, Other (non HMO) | Source: Ambulatory Visit | Attending: Gastroenterology | Admitting: Gastroenterology

## 2014-10-03 ENCOUNTER — Other Ambulatory Visit: Payer: Self-pay | Admitting: Gastroenterology

## 2014-10-03 DIAGNOSIS — Z79899 Other long term (current) drug therapy: Secondary | ICD-10-CM | POA: Insufficient documentation

## 2014-10-03 DIAGNOSIS — Z87442 Personal history of urinary calculi: Secondary | ICD-10-CM | POA: Diagnosis not present

## 2014-10-03 DIAGNOSIS — K640 First degree hemorrhoids: Secondary | ICD-10-CM | POA: Diagnosis not present

## 2014-10-03 DIAGNOSIS — K5732 Diverticulitis of large intestine without perforation or abscess without bleeding: Secondary | ICD-10-CM | POA: Diagnosis present

## 2014-10-03 DIAGNOSIS — K2970 Gastritis, unspecified, without bleeding: Secondary | ICD-10-CM | POA: Insufficient documentation

## 2014-10-03 DIAGNOSIS — R112 Nausea with vomiting, unspecified: Secondary | ICD-10-CM | POA: Insufficient documentation

## 2014-10-03 DIAGNOSIS — K228 Other specified diseases of esophagus: Secondary | ICD-10-CM | POA: Insufficient documentation

## 2014-10-03 DIAGNOSIS — I10 Essential (primary) hypertension: Secondary | ICD-10-CM | POA: Diagnosis not present

## 2014-10-03 DIAGNOSIS — K5730 Diverticulosis of large intestine without perforation or abscess without bleeding: Secondary | ICD-10-CM | POA: Diagnosis not present

## 2014-10-03 DIAGNOSIS — Z888 Allergy status to other drugs, medicaments and biological substances status: Secondary | ICD-10-CM | POA: Diagnosis not present

## 2014-10-03 DIAGNOSIS — K529 Noninfective gastroenteritis and colitis, unspecified: Secondary | ICD-10-CM | POA: Diagnosis not present

## 2014-10-03 DIAGNOSIS — Z87891 Personal history of nicotine dependence: Secondary | ICD-10-CM | POA: Diagnosis not present

## 2014-10-03 DIAGNOSIS — K2289 Other specified disease of esophagus: Secondary | ICD-10-CM | POA: Insufficient documentation

## 2014-10-03 DIAGNOSIS — D125 Benign neoplasm of sigmoid colon: Secondary | ICD-10-CM | POA: Insufficient documentation

## 2014-10-03 DIAGNOSIS — K589 Irritable bowel syndrome without diarrhea: Secondary | ICD-10-CM | POA: Diagnosis not present

## 2014-10-03 HISTORY — PX: ESOPHAGOGASTRODUODENOSCOPY (EGD) WITH PROPOFOL: SHX5813

## 2014-10-03 HISTORY — DX: Unspecified osteoarthritis, unspecified site: M19.90

## 2014-10-03 HISTORY — PX: COLONOSCOPY WITH PROPOFOL: SHX5780

## 2014-10-03 HISTORY — DX: Presence of spectacles and contact lenses: Z97.3

## 2014-10-03 SURGERY — COLONOSCOPY
Anesthesia: Choice

## 2014-10-03 SURGERY — ESOPHAGOGASTRODUODENOSCOPY (EGD) WITH PROPOFOL
Anesthesia: General

## 2014-10-03 MED ORDER — LIDOCAINE HCL (CARDIAC) 20 MG/ML IV SOLN
INTRAVENOUS | Status: DC | PRN
  Administered 2014-10-03: 30 mg via INTRAVENOUS

## 2014-10-03 MED ORDER — SODIUM CHLORIDE 0.9 % IV SOLN: INTRAVENOUS | Status: DC

## 2014-10-03 MED ORDER — PROPOFOL 10 MG/ML IV BOLUS
INTRAVENOUS | Status: DC | PRN
  Administered 2014-10-03 (×9): 20 mg via INTRAVENOUS

## 2014-10-03 MED ORDER — LACTATED RINGERS IV SOLN
INTRAVENOUS | Status: DC
  Administered 2014-10-03 (×2): via INTRAVENOUS

## 2014-10-03 MED ORDER — STERILE WATER FOR IRRIGATION IR SOLN
Status: DC | PRN
  Administered 2014-10-03: 5 mL

## 2014-10-03 SURGICAL SUPPLY — 39 items
BALLN DILATOR 10-12 8 (BALLOONS)
BALLN DILATOR 12-15 8 (BALLOONS)
BALLN DILATOR 15-18 8 (BALLOONS)
BALLN DILATOR CRE 0-12 8 (BALLOONS)
BALLN DILATOR ESOPH 8 10 CRE (MISCELLANEOUS) IMPLANT
BALLOON DILATOR 12-15 8 (BALLOONS) IMPLANT
BALLOON DILATOR 15-18 8 (BALLOONS) IMPLANT
BALLOON DILATOR CRE 0-12 8 (BALLOONS) IMPLANT
BLOCK BITE 60FR ADLT L/F GRN (MISCELLANEOUS) ×3 IMPLANT
CANISTER SUCT 1200ML W/VALVE (MISCELLANEOUS) ×3 IMPLANT
FCP ESCP3.2XJMB 240X2.8X (MISCELLANEOUS)
FORCEPS BIOP RAD 4 LRG CAP 4 (CUTTING FORCEPS) IMPLANT
FORCEPS BIOP RJ4 240 W/NDL (MISCELLANEOUS)
FORCEPS ESCP3.2XJMB 240X2.8X (MISCELLANEOUS) IMPLANT
GOWN CVR UNV OPN BCK APRN NK (MISCELLANEOUS) ×2 IMPLANT
GOWN ISOL THUMB LOOP REG UNIV (MISCELLANEOUS) ×4
HEMOCLIP INSTINCT (CLIP) IMPLANT
INJECTOR VARIJECT VIN23 (MISCELLANEOUS) IMPLANT
KIT CO2 TUBING (TUBING) IMPLANT
KIT DEFENDO VALVE AND CONN (KITS) IMPLANT
KIT ENDO PROCEDURE OLY (KITS) ×3 IMPLANT
LIGATOR MULTIBAND 6SHOOTER MBL (MISCELLANEOUS) IMPLANT
MARKER SPOT ENDO TATTOO 5ML (MISCELLANEOUS) IMPLANT
PAD GROUND ADULT SPLIT (MISCELLANEOUS) IMPLANT
SNARE SHORT THROW 13M SML OVAL (MISCELLANEOUS) IMPLANT
SNARE SHORT THROW 30M LRG OVAL (MISCELLANEOUS) IMPLANT
SPOT EX ENDOSCOPIC TATTOO (MISCELLANEOUS)
SUCTION POLY TRAP 4CHAMBER (MISCELLANEOUS) IMPLANT
SYR INFLATION 60ML (SYRINGE) IMPLANT
TRAP SUCTION POLY (MISCELLANEOUS) IMPLANT
TUBING CONN 6MMX3.1M (TUBING)
TUBING SUCTION CONN 0.25 STRL (TUBING) IMPLANT
UNDERPAD 30X60 958B10 (PK) (MISCELLANEOUS) IMPLANT
VALVE BIOPSY ENDO (VALVE) IMPLANT
VARIJECT INJECTOR VIN23 (MISCELLANEOUS)
WATER AUXILLARY (MISCELLANEOUS) IMPLANT
WATER STERILE IRR 250ML POUR (IV SOLUTION) ×3 IMPLANT
WATER STERILE IRR 500ML POUR (IV SOLUTION) IMPLANT
WIRE CRE 18-20MM 8CM F G (MISCELLANEOUS) IMPLANT

## 2014-10-03 NOTE — Op Note (Signed)
[facility]astroenterology Patient Name: Edward Harris Procedure Date: 10/03/2014 8:03 AM MRN: [id number] Account #: [id number] Date of Birth: [birth year] Admit Type: Outpatient Age: 39 Room: [facility] OR ROOM 01 Gender: Male Note Status: Finalized Procedure:         Upper GI endoscopy Indications:       Nausea with vomiting Providers:         Lucilla Lame, MD Referring MD:      Hewitt Blade. Sarina Ser, MD (Referring MD) Medicines:         Propofol per Anesthesia Complications:     No immediate complications. Procedure:         Pre-Anesthesia Assessment:                    - Prior to the procedure, a History and Physical was                     performed, and patient medications and allergies were                     reviewed. The patient's tolerance of previous anesthesia                     was also reviewed. The risks and benefits of the procedure                     and the sedation options and risks were discussed with the                     patient. All questions were answered, and informed consent                     was obtained. Prior Anticoagulants: The patient has taken                     no previous anticoagulant or antiplatelet agents. ASA                     Grade Assessment: II - A patient with mild systemic                     disease. After reviewing the risks and benefits, the                     patient was deemed in satisfactory condition to undergo                     the procedure.                    After obtaining informed consent, the endoscope was passed                     under direct vision. Throughout the procedure, the                     patient's blood pressure, pulse, and oxygen saturations                     were monitored continuously. The Olympus GIF-[id number]                     Endoscope (S#. [id number]) was introduced through the mouth,  and advanced to the second part of duodenum. The upper GI   endoscopy was accomplished without difficulty. The patient                     tolerated the procedure well. Findings:      The Z-line was irregular and was found at the gastroesophageal junction.      Localized minimal inflammation characterized by erythema was found in       the gastric antrum. Biopsies were taken with a cold forceps for       histology.      The examined duodenum was normal. Impression:        - Z-line irregular, at the gastroesophageal junction.                    - Gastritis. Biopsied.                    - Normal examined duodenum. Recommendation:    - Await pathology results.                    - Perform a colonoscopy today. Procedure Code(s): --- Professional ---                    [phone], Esophagogastroduodenoscopy, flexible, transoral;                     with biopsy, single or multiple Diagnosis Code(s): --- Professional ---                    R11.2, Nausea with vomiting, unspecified                    K22.8, Other specified diseases of esophagus                    K29.70, Gastritis, unspecified, without bleeding CPT copyright 2014 American Medical Association. All rights reserved. The codes documented in this report are preliminary and upon coder review may  be revised to meet current compliance requirements. Lucilla Lame, MD 10/03/2014 8:28:40 AM This report has been signed electronically. Number of Addenda: 0 Note Initiated On: 10/03/2014 8:03 AM      [facility]

## 2014-10-03 NOTE — Transfer of Care (Signed)
Immediate Anesthesia Transfer of Care Note  Patient: Edward Harris  Procedure(s) Performed: Procedure(s): ESOPHAGOGASTRODUODENOSCOPY (EGD) WITH PROPOFOL (N/A) COLONOSCOPY WITH PROPOFOL (N/A)  Patient Location: PACU  Anesthesia Type: General  Level of Consciousness: awake, alert  and patient cooperative  Airway and Oxygen Therapy: Patient Spontanous Breathing and Patient connected to supplemental oxygen  Post-op Assessment: Post-op Vital signs reviewed, Patient's Cardiovascular Status Stable, Respiratory Function Stable, Patent Airway and No signs of Nausea or vomiting  Post-op Vital Signs: Reviewed and stable  Complications: No apparent anesthesia complications

## 2014-10-03 NOTE — H&P (Signed)
[facility]  [address] [place], [place] [address] Phone: [phone] Fax : [phone]  Primary Care Physician:  Madelyn Brunner, MD Primary Gastroenterologist:  Dr. Allen Norris  Pre-Procedure History & Physical: HPI:  Edward Harris is a 39 y.o. male is here for an endoscopy and colonoscopy.   Past Medical History  Diagnosis Date  . Diverticulitis   . Hypertension   . IBS (irritable bowel syndrome)   . Nephrolithiasis   . Arthritis     ankles  . Wears contact lenses     Past Surgical History  Procedure Laterality Date  . Inguinal hernia repair Right   . Kidney stone surgery      extraction/lithotripsy    Prior to Admission medications   Medication Sig Start Date End Date Taking? Authorizing Provider  metoCLOPramide (REGLAN) 10 MG tablet Take 1 tablet (10 mg total) by mouth every 8 (eight) hours as needed for nausea or vomiting. Patient taking differently: Take 10 mg by mouth 3 (three) times daily.  09/23/14  Yes Earleen Newport, MD  pantoprazole (PROTONIX) 40 MG tablet Take 1 tablet (40 mg total) by mouth daily. 09/26/14  Yes Andria Meuse, NP  ranitidine (ZANTAC) 150 MG tablet Take 150 mg by mouth at bedtime.   Yes Historical Provider, MD  sucralfate (CARAFATE) 1 G tablet Take 1 tablet (1 g total) by mouth 4 (four) times daily. 09/23/14 09/23/15 Yes Earleen Newport, MD  losartan-hydrochlorothiazide (HYZAAR) 100-25 MG per tablet Take 1 tablet by mouth daily. 07/14/14   Historical Provider, MD    Allergies as of 09/12/2014 - Review Complete 09/07/2014  Allergen Reaction Noted  . Amlodipine  08/19/2014    Family History  Problem Relation Age of Onset  . Hypertension Mother   . Osteoarthritis Mother   . Heart attack Father   . Hypertension Brother   . Alzheimer's disease Maternal Aunt   . Lung cancer    . Colon cancer Maternal Grandmother 75    History   Social History  . Marital Status: Married    Spouse Name: N/A  . Number  of Children: 4  . Years of Education: N/A   Occupational History  . Deliver sheet rock    Social History Main Topics  . Smoking status: Former Research scientist (life sciences)  . Smokeless tobacco: Not on file  . Alcohol Use: 0.0 oz/week    0 Standard drinks or equivalent per week     Comment: "occassional" couple drinks per month  . Drug Use: No  . Sexual Activity: Not on file   Other Topics Concern  . Not on file   Social History Narrative   Lives with wife & children    Review of Systems: See HPI, otherwise negative ROS  Physical Exam: BP 158/107 mmHg  Pulse 59  Temp(Src) 97 F (36.1 C)  Resp 11  Ht 5\' 9"  (1.753 m)  Wt 213 lb (96.616 kg)  BMI 31.44 kg/m2  SpO2 100% General:   Alert,  pleasant and cooperative in NAD Head:  Normocephalic and atraumatic. Neck:  Supple; no masses or thyromegaly. Lungs:  Clear throughout to auscultation.    Heart:  Regular rate and rhythm. Abdomen:  Soft, nontender and nondistended. Normal bowel sounds, without guarding, and without rebound.   Neurologic:  Alert and  oriented x4;  grossly normal neurologically.  Impression/Plan: ADIL TUGWELL is here for an endoscopy and colonoscopy to be performed for nausea vomiting and recent diverticulititis  Risks, benefits, limitations,  and alternatives regarding  endoscopy and colonoscopy have been reviewed with the patient.  Questions have been answered.  All parties agreeable.   Ollen Bowl, MD  10/03/2014, 9:02 AM

## 2014-10-03 NOTE — Op Note (Signed)
[facility]enterology Patient Name: Edward Harris Procedure Date: 10/03/2014 8:05 AM MRN: [id number] Account #: [id number] Date of Birth: 1975/11/26 Admit Type: Outpatient Age: 39 Room: [facility] OR ROOM 01 Gender: Male Note Status: Finalized Procedure:         Colonoscopy Indications:       Follow-up of diverticulitis Providers:         Lucilla Lame, MD Referring MD:      Hewitt Blade. Sarina Ser, MD (Referring MD) Medicines:         Propofol per Anesthesia Complications:     No immediate complications. Procedure:         Pre-Anesthesia Assessment:                    - Prior to the procedure, a History and Physical was                     performed, and patient medications and allergies were                     reviewed. The patient's tolerance of previous anesthesia                     was also reviewed. The risks and benefits of the procedure                     and the sedation options and risks were discussed with the                     patient. All questions were answered, and informed consent                     was obtained. Prior Anticoagulants: The patient has taken                     no previous anticoagulant or antiplatelet agents. ASA                     Grade Assessment: II - A patient with mild systemic                     disease. After reviewing the risks and benefits, the                     patient was deemed in satisfactory condition to undergo                     the procedure.                    After obtaining informed consent, the colonoscope was                     passed under direct vision. Throughout the procedure, the                     patient's blood pressure, pulse, and oxygen saturations                     were monitored continuously. The Olympus CF-HQ190L                     Colonoscope (S#. [id number]) was introduced through the anus  and advanced to the the cecum, identified by appendiceal   orifice and ileocecal valve. The colonoscopy was performed                     without difficulty. The patient tolerated the procedure                     well. The quality of the bowel preparation was excellent. Findings:      The perianal and digital rectal examinations were normal.      Two sessile polyps were found in the sigmoid colon. The polyps were 2 to       3 mm in size. These polyps were removed with a cold biopsy forceps.       Resection and retrieval were complete.      Scattered small-mouthed diverticula were found in the descending colon,       in the transverse colon and in the ascending colon.      Multiple small-mouthed diverticula were found in the sigmoid colon.      Localized mild inflammation characterized by erythema was found in the       sigmoid colon.      Non-bleeding internal hemorrhoids were found during retroflexion. The       hemorrhoids were Grade I (internal hemorrhoids that do not prolapse). Impression:        - Two 2 to 3 mm polyps in the sigmoid colon. Resected and                     retrieved.                    - Diverticulosis in the descending colon, in the                     transverse colon and in the ascending colon.                    - Diverticulosis in the sigmoid colon.                    - Localized mild inflammation was found in the sigmoid                     colon.                    - Non-bleeding internal hemorrhoids. Recommendation:    - Refer to a Psychologist, sport and exercise. Procedure Code(s): --- Professional ---                    [phone], Colonoscopy, flexible; with biopsy, single or                     multiple Diagnosis Code(s): --- Professional ---                    K57.32, Diverticulitis of large intestine without                     perforation or abscess without bleeding                    D12.5, Benign neoplasm of sigmoid colon                    K52.9, Noninfective gastroenteritis and colitis,  unspecified CPT  copyright 2014 American Medical Association. All rights reserved. The codes documented in this report are preliminary and upon coder review may  be revised to meet current compliance requirements. Lucilla Lame, MD 10/03/2014 8:45:37 AM This report has been signed electronically. Number of Addenda: 0 Note Initiated On: 10/03/2014 8:05 AM Scope Withdrawal Time: 0 hours 6 minutes 29 seconds  Total Procedure Duration: 0 hours 10 minutes 37 seconds       [facility]

## 2014-10-03 NOTE — H&P (Deleted)
  [facility]  [address] [place], [place] [address] Phone: [phone] Fax : [phone]  Primary Care Physician:  Madelyn Brunner, MD Primary Gastroenterologist:  Dr. Allen Norris  Pre-Procedure History & Physical: HPI:  Edward Harris is a 39 y.o. male is here for a screening colonoscopy.   Past Medical History  Diagnosis Date  . Diverticulitis   . Hypertension   . IBS (irritable bowel syndrome)   . Nephrolithiasis   . Arthritis     ankles  . Wears contact lenses     Past Surgical History  Procedure Laterality Date  . Inguinal hernia repair Right   . Kidney stone surgery      extraction/lithotripsy    Prior to Admission medications   Medication Sig Start Date End Date Taking? Authorizing Provider  losartan-hydrochlorothiazide (HYZAAR) 100-25 MG per tablet Take 1 tablet by mouth daily. 07/14/14   Historical Provider, MD  metoCLOPramide (REGLAN) 10 MG tablet Take 1 tablet (10 mg total) by mouth every 8 (eight) hours as needed for nausea or vomiting. Patient taking differently: Take 10 mg by mouth 3 (three) times daily.  09/23/14   Earleen Newport, MD  pantoprazole (PROTONIX) 40 MG tablet Take 1 tablet (40 mg total) by mouth daily. 09/26/14   Andria Meuse, NP  ranitidine (ZANTAC) 150 MG tablet Take 150 mg by mouth at bedtime.    Historical Provider, MD  sucralfate (CARAFATE) 1 G tablet Take 1 tablet (1 g total) by mouth 4 (four) times daily. 09/23/14 09/23/15  Earleen Newport, MD    Allergies as of 09/12/2014 - Review Complete 09/07/2014  Allergen Reaction Noted  . Amlodipine  08/19/2014    Family History  Problem Relation Age of Onset  . Hypertension Mother   . Osteoarthritis Mother   . Heart attack Father   . Hypertension Brother   . Alzheimer's disease Maternal Aunt   . Lung cancer    . Colon cancer Maternal Grandmother 75    History   Social History  . Marital Status: Married    Spouse Name: N/A  . Number of Children: 4  .  Years of Education: N/A   Occupational History  . Deliver sheet rock    Social History Main Topics  . Smoking status: Former Research scientist (life sciences)  . Smokeless tobacco: Not on file  . Alcohol Use: 0.0 oz/week    0 Standard drinks or equivalent per week     Comment: "occassional" couple drinks per month  . Drug Use: No  . Sexual Activity: Not on file   Other Topics Concern  . Not on file   Social History Narrative   Lives with wife & children    Review of Systems: See HPI, otherwise negative ROS  Physical Exam: Ht 5\' 9"  (1.753 m)  Wt 221 lb (100.245 kg)  BMI 32.62 kg/m2 General:   Alert,  pleasant and cooperative in NAD Head:  Normocephalic and atraumatic. Neck:  Supple; no masses or thyromegaly. Lungs:  Clear throughout to auscultation.    Heart:  Regular rate and rhythm. Abdomen:  Soft, nontender and nondistended. Normal bowel sounds, without guarding, and without rebound.   Neurologic:  Alert and  oriented x4;  grossly normal neurologically.  Impression/Plan: Edward Harris is now here to undergo a screening colonoscopy.  Risks, benefits, and alternatives regarding colonoscopy have been reviewed with the patient.  Questions have been answered.  All parties agreeable.

## 2014-10-03 NOTE — Anesthesia Postprocedure Evaluation (Signed)
  Anesthesia Post-op Note  Patient: Edward Harris  Procedure(s) Performed: Procedure(s): ESOPHAGOGASTRODUODENOSCOPY (EGD) WITH PROPOFOL (N/A) COLONOSCOPY WITH PROPOFOL (N/A)  Anesthesia type:General  Patient location: PACU  Post pain: Pain level controlled  Post assessment: Post-op Vital signs reviewed, Patient's Cardiovascular Status Stable, Respiratory Function Stable, Patent Airway and No signs of Nausea or vomiting  Post vital signs: Reviewed and stable  Last Vitals:  Filed Vitals:   10/03/14 0856  BP:   Pulse: 59  Temp:   Resp: 11    Level of consciousness: awake, alert  and patient cooperative  Complications: No apparent anesthesia complications

## 2014-10-03 NOTE — Anesthesia Preprocedure Evaluation (Signed)
Anesthesia Evaluation  Patient identified by MRN, date of birth, ID band Patient awake    Reviewed: Allergy & Precautions, NPO status , Patient's Chart, lab work & pertinent test results  Airway Mallampati: II  TM Distance: >3 FB Neck ROM: Full    Dental no notable dental hx.    Pulmonary neg pulmonary ROS, former smoker,  breath sounds clear to auscultation  Pulmonary exam normal       Cardiovascular hypertension, negative cardio ROS Normal cardiovascular examRhythm:Regular Rate:Normal     Neuro/Psych negative neurological ROS  negative psych ROS   GI/Hepatic negative GI ROS, Neg liver ROS, diverticulitis   Endo/Other  negative endocrine ROS  Renal/GU negative Renal ROS  negative genitourinary   Musculoskeletal negative musculoskeletal ROS (+)   Abdominal   Peds negative pediatric ROS (+)  Hematology negative hematology ROS (+)   Anesthesia Other Findings   Reproductive/Obstetrics negative OB ROS                             Anesthesia Physical Anesthesia Plan  ASA: II  Anesthesia Plan: General   Post-op Pain Management:    Induction: Intravenous  Airway Management Planned:   Additional Equipment:   Intra-op Plan:   Post-operative Plan: Extubation in OR  Informed Consent: I have reviewed the patients History and Physical, chart, labs and discussed the procedure including the risks, benefits and alternatives for the proposed anesthesia with the patient or authorized representative who has indicated his/her understanding and acceptance.   Dental advisory given  Plan Discussed with: CRNA  Anesthesia Plan Comments:         Anesthesia Quick Evaluation

## 2014-10-04 ENCOUNTER — Encounter: Payer: Self-pay | Admitting: Gastroenterology

## 2014-10-05 ENCOUNTER — Ambulatory Visit (INDEPENDENT_AMBULATORY_CARE_PROVIDER_SITE_OTHER): Payer: Managed Care, Other (non HMO) | Admitting: Surgery

## 2014-10-05 ENCOUNTER — Encounter: Payer: Self-pay | Admitting: Surgery

## 2014-10-05 VITALS — BP 156/108 | HR 71 | Temp 97.3°F | Ht 69.0 in | Wt 214.8 lb

## 2014-10-05 DIAGNOSIS — K5732 Diverticulitis of large intestine without perforation or abscess without bleeding: Secondary | ICD-10-CM | POA: Diagnosis not present

## 2014-10-05 NOTE — Patient Instructions (Signed)
Edward Harris, we will be contacting you as soon as we get to schedule your surgery. Give [place] a call if you have any questions.

## 2014-10-05 NOTE — H&P (Signed)
CC: persistent LLQ pain HPI: 39 yo M who presents with recurrent LLQ pain and clinical and radiographic s/sx of diverticulitis. First episode was 2015 and was admitted with ARF from dehydration.  Has had approx 4 episodes since which are getting closer together.  Poor appetite.  Colonoscopy 2 days ago which showed extensive diverticulosis.  No fevers/chills, night sweats, shortness of breath, cough, chest pain, nausea/vomiting, diarrhea/constipation, dysuria/hematuria.  Active Ambulatory Problems    Diagnosis Date Noted  . Diverticulitis 08/19/2014  . Ileus 08/19/2014  . Hypertension 08/19/2014  . Diverticulitis large intestine w/o perforation or abscess w/o bleeding 09/07/2014  . Diverticulitis of colon 09/26/2014  . Non-intractable cyclical vomiting with nausea 09/26/2014  . Benign neoplasm of sigmoid colon   . Diverticulitis of large intestine without perforation or abscess without bleeding   . Nausea with vomiting   . Esophageal hemorrhage   . Gastric catarrh   . Idiopathic colitis    Resolved Ambulatory Problems    Diagnosis Date Noted  . No Resolved Ambulatory Problems   Past Medical History  Diagnosis Date  . IBS (irritable bowel syndrome)   . Nephrolithiasis   . Arthritis   . Wears contact lenses    Allergies  Allergen Reactions  . Amlodipine Other (See Comments)    Sore and numbness in arms.     Medication List       This list is accurate as of: 10/05/14  1:53 PM.  Always use your most recent med list.               losartan-hydrochlorothiazide 100-25 MG per tablet  Commonly known as:  HYZAAR  Take 1 tablet by mouth daily.     metoCLOPramide 10 MG tablet  Commonly known as:  REGLAN  Take 1 tablet (10 mg total) by mouth every 8 (eight) hours as needed for nausea or vomiting.     pantoprazole 40 MG tablet  Commonly known as:  PROTONIX  Take 1 tablet (40 mg total) by mouth daily.     sucralfate 1 G tablet  Commonly known as:  CARAFATE  Take 1 tablet (1 g  total) by mouth 4 (four) times daily.       Family History  Problem Relation Age of Onset  . Hypertension Mother   . Osteoarthritis Mother   . Heart attack Father   . Hypertension Brother   . Alzheimer's disease Maternal Aunt   . Lung cancer    . Colon cancer Maternal Grandmother 75   History   Social History  . Marital Status: Married    Spouse Name: N/A  . Number of Children: 4  . Years of Education: N/A   Occupational History  . Deliver sheet rock    Social History Main Topics  . Smoking status: Former Research scientist (life sciences)  . Smokeless tobacco: Not on file  . Alcohol Use: 0.0 oz/week    0 Standard drinks or equivalent per week     Comment: "occassional" couple drinks per month  . Drug Use: No  . Sexual Activity: Not on file   Other Topics Concern  . Not on file   Social History Narrative   Lives with wife & children   Blood pressure 156/108, pulse 71, temperature 97.3 F (36.3 C), temperature source Oral, height 5\' 9"  (1.753 m), weight 214 lb 12.8 oz (97.433 kg). GEN: NAD/A&Ox3 FACE: no obvious facial trauma, normal external nose, normal external ears EYES: no scleral icterus, no conjunctivitis HEAD: normocephalic atraumatic CV: RRR, no MRG  RESP: moving air well, lungs clear ABD: soft, nontender, nondistended EXT: moving all ext well, strength 5/5 NEURO: cnII-XII grossly intact, sensation intact all 4 ext  Labs: no recent labs Imaging: reviewed last 2 CT scans, earlier shows inflammation at jxn of sigmoid and descending colon, improved on later  A/P 39 yo M with recurrent diverticulitis which is effecting his daily life from pain and poor appetite.  I feel that at this young age he is at risk for multiple future episodes and have offered laparoscopic sigmoid colectomy.  I have explained the risks and benefits of this procedure and he would like to proceed.

## 2014-10-05 NOTE — Progress Notes (Signed)
CC: persistent LLQ pain HPI: 39 yo M who presents with recurrent LLQ pain and clinical and radiographic s/sx of diverticulitis. First episode was 2015 and was admitted with ARF from dehydration.  Has had approx 4 episodes since which are getting closer together.  Poor appetite.  Colonoscopy 2 days ago which showed extensive diverticulosis.  No fevers/chills, night sweats, shortness of breath, cough, chest pain, nausea/vomiting, diarrhea/constipation, dysuria/hematuria.  Active Ambulatory Problems    Diagnosis Date Noted  . Diverticulitis 08/19/2014  . Ileus 08/19/2014  . Hypertension 08/19/2014  . Diverticulitis large intestine w/o perforation or abscess w/o bleeding 09/07/2014  . Diverticulitis of colon 09/26/2014  . Non-intractable cyclical vomiting with nausea 09/26/2014  . Benign neoplasm of sigmoid colon   . Diverticulitis of large intestine without perforation or abscess without bleeding   . Nausea with vomiting   . Esophageal hemorrhage   . Gastric catarrh   . Idiopathic colitis    Resolved Ambulatory Problems    Diagnosis Date Noted  . No Resolved Ambulatory Problems   Past Medical History  Diagnosis Date  . IBS (irritable bowel syndrome)   . Nephrolithiasis   . Arthritis   . Wears contact lenses    Allergies  Allergen Reactions  . Amlodipine Other (See Comments)    Sore and numbness in arms.     Medication List       This list is accurate as of: 10/05/14  1:53 PM.  Always use your most recent med list.               losartan-hydrochlorothiazide 100-25 MG per tablet  Commonly known as:  HYZAAR  Take 1 tablet by mouth daily.     metoCLOPramide 10 MG tablet  Commonly known as:  REGLAN  Take 1 tablet (10 mg total) by mouth every 8 (eight) hours as needed for nausea or vomiting.     pantoprazole 40 MG tablet  Commonly known as:  PROTONIX  Take 1 tablet (40 mg total) by mouth daily.     sucralfate 1 G tablet  Commonly known as:  CARAFATE  Take 1 tablet (1 g  total) by mouth 4 (four) times daily.       Family History  Problem Relation Age of Onset  . Hypertension Mother   . Osteoarthritis Mother   . Heart attack Father   . Hypertension Brother   . Alzheimer's disease Maternal Aunt   . Lung cancer    . Colon cancer Maternal Grandmother 75   History   Social History  . Marital Status: Married    Spouse Name: N/A  . Number of Children: 4  . Years of Education: N/A   Occupational History  . Deliver sheet rock    Social History Main Topics  . Smoking status: Former Research scientist (life sciences)  . Smokeless tobacco: Not on file  . Alcohol Use: 0.0 oz/week    0 Standard drinks or equivalent per week     Comment: "occassional" couple drinks per month  . Drug Use: No  . Sexual Activity: Not on file   Other Topics Concern  . Not on file   Social History Narrative   Lives with wife & children   Blood pressure 156/108, pulse 71, temperature 97.3 F (36.3 C), temperature source Oral, height 5\' 9"  (1.753 m), weight 214 lb 12.8 oz (97.433 kg). GEN: NAD/A&Ox3 FACE: no obvious facial trauma, normal external nose, normal external ears EYES: no scleral icterus, no conjunctivitis HEAD: normocephalic atraumatic CV: RRR, no MRG  RESP: moving air well, lungs clear ABD: soft, nontender, nondistended EXT: moving all ext well, strength 5/5 NEURO: cnII-XII grossly intact, sensation intact all 4 ext  Labs: no recent labs Imaging: reviewed last 2 CT scans, earlier shows inflammation at jxn of sigmoid and descending colon, improved on later  A/P 39 yo M with recurrent diverticulitis which is effecting his daily life from pain and poor appetite.  I feel that at this young age he is at risk for multiple future episodes and have offered laparoscopic sigmoid colectomy.  I have explained the risks and benefits of this procedure and he would like to proceed.

## 2014-10-05 NOTE — Addendum Note (Signed)
Addended by: Marlyce Huge A on: 10/05/2014 01:59 PM   Modules accepted: Level of Service

## 2014-10-10 ENCOUNTER — Telehealth: Payer: Self-pay

## 2014-10-10 NOTE — Telephone Encounter (Signed)
-----   Message from Lucilla Lame, MD sent at 10/09/2014 12:42 PM EDT ----- Jenny Lai, Let the patient know the polyp was hyperplastic and no need fro repeat colonoscopy until he is 51 unless having problems. The EGD showed inflammation with infection. Should be on a PPI.

## 2014-10-10 NOTE — Telephone Encounter (Signed)
Pt notified of results. Pt currently taking a PPI and will continue.

## 2014-10-11 ENCOUNTER — Telehealth: Payer: Self-pay | Admitting: Surgery

## 2014-10-11 NOTE — Telephone Encounter (Signed)
Authorization has been obtained from [facility] for CPT: [phone]. Per Delsa Sale B. REF# [id number] Effective dates: 10/19/14-10/22/14.

## 2014-10-15 ENCOUNTER — Other Ambulatory Visit: Payer: Self-pay

## 2014-10-15 ENCOUNTER — Telehealth: Payer: Self-pay

## 2014-10-15 ENCOUNTER — Encounter
Admission: RE | Admit: 2014-10-15 | Discharge: 2014-10-15 | Disposition: A | Discharge status: Home / Self Care | Payer: Managed Care, Other (non HMO) | Source: Ambulatory Visit | Attending: Surgery | Admitting: Surgery

## 2014-10-15 DIAGNOSIS — K5790 Diverticulosis of intestine, part unspecified, without perforation or abscess without bleeding: Secondary | ICD-10-CM

## 2014-10-15 DIAGNOSIS — I10 Essential (primary) hypertension: Secondary | ICD-10-CM | POA: Diagnosis not present

## 2014-10-15 DIAGNOSIS — Z01810 Encounter for preprocedural cardiovascular examination: Secondary | ICD-10-CM | POA: Diagnosis not present

## 2014-10-15 DIAGNOSIS — Z01812 Encounter for preprocedural laboratory examination: Secondary | ICD-10-CM | POA: Insufficient documentation

## 2014-10-15 LAB — CBC WITH DIFFERENTIAL/PLATELET
BASOS ABS: 0 10*3/uL (ref 0–0.1)
Basophils Relative: 0 %
EOS PCT: 0 %
Eosinophils Absolute: 0 10*3/uL (ref 0–0.7)
HEMATOCRIT: 46.8 % (ref 40.0–52.0)
HEMOGLOBIN: 15.4 g/dL (ref 13.0–18.0)
Lymphocytes Relative: 11 %
Lymphs Abs: 1.1 10*3/uL (ref 1.0–3.6)
MCH: 28.4 pg (ref 26.0–34.0)
MCHC: 32.9 g/dL (ref 32.0–36.0)
MCV: 86.4 fL (ref 80.0–100.0)
Monocytes Absolute: 0.3 10*3/uL (ref 0.2–1.0)
Monocytes Relative: 3 %
NEUTROS ABS: 8.5 10*3/uL — AB (ref 1.4–6.5)
Neutrophils Relative %: 86 %
Platelets: 237 10*3/uL (ref 150–440)
RBC: 5.42 MIL/uL (ref 4.40–5.90)
RDW: 14 % (ref 11.5–14.5)
WBC: 9.9 10*3/uL (ref 3.8–10.6)

## 2014-10-15 LAB — COMPREHENSIVE METABOLIC PANEL
ALT: 43 U/L (ref 17–63)
AST: 26 U/L (ref 15–41)
Albumin: 4.7 g/dL (ref 3.5–5.0)
Alkaline Phosphatase: 95 U/L (ref 38–126)
Anion gap: 13 (ref 5–15)
BUN: 16 mg/dL (ref 6–20)
CHLORIDE: 98 mmol/L — AB (ref 101–111)
CO2: 26 mmol/L (ref 22–32)
CREATININE: 0.92 mg/dL (ref 0.61–1.24)
Calcium: 9.7 mg/dL (ref 8.9–10.3)
Glucose, Bld: 189 mg/dL — ABNORMAL HIGH (ref 65–99)
POTASSIUM: 4 mmol/L (ref 3.5–5.1)
Sodium: 137 mmol/L (ref 135–145)
TOTAL PROTEIN: 8 g/dL (ref 6.5–8.1)
Total Bilirubin: 0.9 mg/dL (ref 0.3–1.2)

## 2014-10-15 MED ORDER — BISACODYL 5 MG PO TBEC
20.0000 mg | DELAYED_RELEASE_TABLET | Freq: Once | ORAL | Status: DC
Start: 2014-10-15 — End: 2014-10-25

## 2014-10-15 MED ORDER — POLYETHYLENE GLYCOL 3350 17 GM/SCOOP PO POWD: 1.0000 | Freq: Once | ORAL | Status: DC

## 2014-10-15 NOTE — Progress Notes (Signed)
Spoke with patient's wife, [name], at this time. She walked into the office after patient's post-op appointment.   She states that patient is vomiting once again. Denies fever. Told her to call there afternoon of 7/14 if patient is still having symptoms to ask about proceeding with surgery. Otherwise for management of these symptoms she would need to contact patient's PCP today.   Went over Prep for Colon surgery scheduled 7/15 with patient and patient's wife. They were given this paperwork to take home.   Sent meds in to pharmacy after verifying with patient.  She is also concerned about disability, as case Freight forwarder at [employer] called patient and told him that she needs more information.  Told [name] that [facility] has not reached out for more information at this time but I will contact them today.

## 2014-10-15 NOTE — OR Nursing (Signed)
Dr Kayleen Memos reviewed EKG's, OK to proceed no essential changes.

## 2014-10-15 NOTE — Telephone Encounter (Signed)
Called [name] at this time to let her know that I am awaiting a call from [place] to send anything further that is needed.  Left VM on both her and Robel's VM stating the information above.

## 2014-10-15 NOTE — Telephone Encounter (Signed)
Edward Harris called and explained that she needed the last office note faxed and a note with the surgery date, approximate recovery time period, and anticipated restrictions.  This was faxed at this time with positive confirmation and will be scanned to chart.

## 2014-10-15 NOTE — Telephone Encounter (Signed)
Patient and wife walked in this am to review Colon Prep instructions for surgery. While they were here, they have another concern:  Patient's wife is also concerned about disability, as case Freight forwarder at [employer]) called patient and told him that she needs more information.  Told [name] that [facility] has not reached out for more information at this time but I will contact them today.

## 2014-10-15 NOTE — Patient Instructions (Signed)
  Your procedure is scheduled on: Friday 10/19/2014 Report to Day Surgery. 2ND FLOOR MEDICAL MALL ENTRANCE To find out your arrival time please call [phone] between 1PM - 3PM on Thursday 10/18/2014.  Remember: Instructions that are not followed completely may result in serious medical risk, up to and including death, or upon the discretion of your surgeon and anesthesiologist your surgery may need to be rescheduled.    __X__ 1. Do not eat food or drink liquids after midnight. No gum chewing or hard candies.     __X__ 2. No Alcohol for 24 hours before or after surgery.   ____ 3. Bring all medications with you on the day of surgery if instructed.    __X__ 4. Notify your doctor if there is any change in your medical condition     (cold, fever, infections).     Do not wear jewelry, make-up, hairpins, clips or nail polish.  Do not wear lotions, powders, or perfumes.   Do not shave 48 hours prior to surgery. Men may shave face and neck.  Do not bring valuables to the hospital.    [facility] is not responsible for any belongings or valuables.               Contacts, dentures or bridgework may not be worn into surgery.  Leave your suitcase in the car. After surgery it may be brought to your room.  For patients admitted to the hospital, discharge time is determined by your                treatment team.   Patients discharged the day of surgery will not be allowed to drive home.   Please read over the following fact sheets that you were given:   Surgical Site Infection Prevention   ____ Take these medicines the morning of surgery with A SIP OF WATER:    1.   2.   3.   4.  5.  6.  ____ Fleet Enema (as directed)   __X__ Use CHG Soap as directed  ____ Use inhalers on the day of surgery  ____ Stop metformin 2 days prior to surgery    ____ Take 1/2 of usual insulin dose the night before surgery and none on the morning of surgery.   ____ Stop Coumadin/Plavix/aspirin on   ____  Stop Anti-inflammatories on    ____ Stop supplements until after surgery.    ____ Bring C-Pap to the hospital.

## 2014-10-15 NOTE — Telephone Encounter (Signed)
Spoke with Dellis Filbert at [facility] to ask what other paperwork is needed for this patient. He then contacted Rise Paganini (pt's case worker) at this time and she states that she will need to call me directly.  I gave Dellis Filbert my name and number and will await a call from [place].  Patient's Case Number is : [id number] (This needs to be placed on any faxes that are sent to [facility])

## 2014-10-17 ENCOUNTER — Telehealth: Payer: Self-pay | Admitting: Surgery

## 2014-10-17 ENCOUNTER — Telehealth: Payer: Self-pay

## 2014-10-17 NOTE — Telephone Encounter (Signed)
Please call patients wife. She wants to speak with a nurse today about her husbands surgery on Friday. He is still vomiting and he is supposed to start his prep in the morning and she wants to know if he is still vomiting in the morning does he need to still have surgery and should he still take the prep. She is adamant about speaking with someone today so she will know whether to start the prep in the morning. Please call and advise.

## 2014-10-17 NOTE — Telephone Encounter (Signed)
Please read other note from today. 

## 2014-10-17 NOTE — Telephone Encounter (Signed)
Before I called patient we had asked Dr. Rexene Edison for advice. Dr. Rexene Edison stated that if patient occasionally vomits but is able to eat, to continue with his prep. However, if he is vomiting and not eating, he should go to the ED. Called patient back and had to leave him a voicemail with Dr. Rexene Edison advice.

## 2014-10-19 ENCOUNTER — Inpatient Hospital Stay: Payer: Managed Care, Other (non HMO) | Admitting: Anesthesiology

## 2014-10-19 ENCOUNTER — Encounter: Payer: Self-pay | Admitting: *Deleted

## 2014-10-19 ENCOUNTER — Encounter
Admission: RE | Disposition: A | Discharge status: Home / Self Care | Payer: Self-pay | Source: Ambulatory Visit | Attending: Surgery

## 2014-10-19 ENCOUNTER — Inpatient Hospital Stay
Admission: RE | Admit: 2014-10-19 | Discharge: 2014-10-25 | DRG: 331 | Disposition: A | Discharge status: Home / Self Care | Payer: Managed Care, Other (non HMO) | Source: Ambulatory Visit | Attending: Surgery | Admitting: Surgery

## 2014-10-19 DIAGNOSIS — K5792 Diverticulitis of intestine, part unspecified, without perforation or abscess without bleeding: Secondary | ICD-10-CM | POA: Diagnosis present

## 2014-10-19 DIAGNOSIS — Z888 Allergy status to other drugs, medicaments and biological substances status: Secondary | ICD-10-CM | POA: Diagnosis not present

## 2014-10-19 DIAGNOSIS — K5732 Diverticulitis of large intestine without perforation or abscess without bleeding: Secondary | ICD-10-CM | POA: Diagnosis present

## 2014-10-19 DIAGNOSIS — Z800 Family history of malignant neoplasm of digestive organs: Secondary | ICD-10-CM | POA: Diagnosis not present

## 2014-10-19 DIAGNOSIS — I10 Essential (primary) hypertension: Secondary | ICD-10-CM | POA: Diagnosis present

## 2014-10-19 DIAGNOSIS — Z820 Family history of epilepsy and other diseases of the nervous system: Secondary | ICD-10-CM

## 2014-10-19 DIAGNOSIS — Z79899 Other long term (current) drug therapy: Secondary | ICD-10-CM | POA: Diagnosis not present

## 2014-10-19 DIAGNOSIS — Z8249 Family history of ischemic heart disease and other diseases of the circulatory system: Secondary | ICD-10-CM | POA: Diagnosis not present

## 2014-10-19 DIAGNOSIS — Z87891 Personal history of nicotine dependence: Secondary | ICD-10-CM

## 2014-10-19 DIAGNOSIS — Z801 Family history of malignant neoplasm of trachea, bronchus and lung: Secondary | ICD-10-CM | POA: Diagnosis not present

## 2014-10-19 HISTORY — PX: LAPAROSCOPIC SIGMOID COLECTOMY: SHX5928

## 2014-10-19 LAB — CREATININE, SERUM
CREATININE: 0.91 mg/dL (ref 0.61–1.24)
GFR calc Af Amer: >60 mL/min (ref >60)

## 2014-10-19 LAB — CBC
HCT: 43.2 % (ref 40.0–52.0)
Hemoglobin: 14.4 g/dL (ref 13.0–18.0)
MCH: 28.3 pg (ref 26.0–34.0)
MCHC: 33.3 g/dL (ref 32.0–36.0)
MCV: 85 fL (ref 80.0–100.0)
Platelets: 271 10*3/uL (ref 150–440)
RBC: 5.09 MIL/uL (ref 4.40–5.90)
RDW: 13.7 % (ref 11.5–14.5)
WBC: 17.1 10*3/uL — ABNORMAL HIGH (ref 3.8–10.6)

## 2014-10-19 SURGERY — COLECTOMY, SIGMOID, LAPAROSCOPIC
Anesthesia: General | Wound class: Contaminated

## 2014-10-19 MED ORDER — ERTAPENEM SODIUM 1 G IJ SOLR
1.0000 g | INTRAMUSCULAR | Status: AC
  Administered 2014-10-19: 1 g via INTRAVENOUS
  Filled 2014-10-19: qty 1

## 2014-10-19 MED ORDER — PANTOPRAZOLE SODIUM 40 MG IV SOLR
40.0000 mg | Freq: Every day | INTRAVENOUS | Status: DC
  Administered 2014-10-19 – 2014-10-24 (×6): 40 mg via INTRAVENOUS
  Filled 2014-10-19 (×6): qty 40

## 2014-10-19 MED ORDER — ACETAMINOPHEN 325 MG PO TABS: 650.0000 mg | ORAL_TABLET | Freq: Four times a day (QID) | ORAL | Status: DC | PRN

## 2014-10-19 MED ORDER — FENTANYL CITRATE (PF) 100 MCG/2ML IJ SOLN
25.0000 ug | INTRAMUSCULAR | Status: DC | PRN
Start: 2014-10-19 — End: 2014-10-19
  Administered 2014-10-19: 25 ug via INTRAVENOUS
  Administered 2014-10-19: 100 ug via INTRAVENOUS
  Administered 2014-10-19 (×3): 25 ug via INTRAVENOUS

## 2014-10-19 MED ORDER — MIDAZOLAM HCL 2 MG/2ML IJ SOLN
INTRAMUSCULAR | Status: DC | PRN
  Administered 2014-10-19: 2 mg via INTRAVENOUS

## 2014-10-19 MED ORDER — LIDOCAINE HCL (CARDIAC) 20 MG/ML IV SOLN
INTRAVENOUS | Status: DC | PRN
  Administered 2014-10-19: 80 mg via INTRAVENOUS

## 2014-10-19 MED ORDER — HYDROMORPHONE HCL 1 MG/ML IJ SOLN
0.2500 mg | INTRAMUSCULAR | Status: DC | PRN
  Administered 2014-10-19 (×4): 0.25 mg via INTRAVENOUS

## 2014-10-19 MED ORDER — SODIUM CHLORIDE 0.9 % IV SOLN
INTRAVENOUS | Status: DC
  Administered 2014-10-19 – 2014-10-21 (×4): via INTRAVENOUS

## 2014-10-19 MED ORDER — ROCURONIUM BROMIDE 100 MG/10ML IV SOLN
INTRAVENOUS | Status: DC | PRN
  Administered 2014-10-19 (×3): 10 mg via INTRAVENOUS
  Administered 2014-10-19: 20 mg via INTRAVENOUS
  Administered 2014-10-19 (×2): 10 mg via INTRAVENOUS

## 2014-10-19 MED ORDER — ONDANSETRON HCL 4 MG/2ML IJ SOLN
4.0000 mg | Freq: Once | INTRAMUSCULAR | Status: AC | PRN
  Administered 2014-10-19: 4 mg via INTRAVENOUS

## 2014-10-19 MED ORDER — LOSARTAN POTASSIUM 50 MG PO TABS
100.0000 mg | ORAL_TABLET | Freq: Every day | ORAL | Status: DC
  Administered 2014-10-19 – 2014-10-25 (×7): 100 mg via ORAL
  Filled 2014-10-19 (×7): qty 2

## 2014-10-19 MED ORDER — ACETAMINOPHEN 10 MG/ML IV SOLN
INTRAVENOUS | Status: AC
  Filled 2014-10-19: qty 100

## 2014-10-19 MED ORDER — PHENYLEPHRINE HCL 10 MG/ML IJ SOLN
INTRAMUSCULAR | Status: DC | PRN
  Administered 2014-10-19 (×4): 100 ug via INTRAVENOUS

## 2014-10-19 MED ORDER — FENTANYL CITRATE (PF) 100 MCG/2ML IJ SOLN
INTRAMUSCULAR | Status: AC
  Administered 2014-10-19: 25 ug via INTRAVENOUS
  Filled 2014-10-19: qty 2

## 2014-10-19 MED ORDER — SUGAMMADEX SODIUM 500 MG/5ML IV SOLN
INTRAVENOUS | Status: DC | PRN
  Administered 2014-10-19: 388.4 mg via INTRAVENOUS

## 2014-10-19 MED ORDER — LOSARTAN POTASSIUM-HCTZ 100-25 MG PO TABS: 1.0000 | ORAL_TABLET | Freq: Every day | ORAL | Status: DC

## 2014-10-19 MED ORDER — ACETAMINOPHEN 650 MG RE SUPP: 650.0000 mg | Freq: Four times a day (QID) | RECTAL | Status: DC | PRN

## 2014-10-19 MED ORDER — SUGAMMADEX SODIUM 500 MG/5ML IV SOLN: INTRAVENOUS | Status: DC | PRN

## 2014-10-19 MED ORDER — ONDANSETRON HCL 4 MG/2ML IJ SOLN: 4.0000 mg | Freq: Four times a day (QID) | INTRAMUSCULAR | Status: DC | PRN

## 2014-10-19 MED ORDER — HYDROMORPHONE HCL 1 MG/ML IJ SOLN
1.0000 mg | INTRAMUSCULAR | Status: DC | PRN
  Administered 2014-10-19 – 2014-10-25 (×32): 1 mg via INTRAVENOUS
  Filled 2014-10-19 (×32): qty 1

## 2014-10-19 MED ORDER — ACETAMINOPHEN 10 MG/ML IV SOLN
INTRAVENOUS | Status: DC | PRN
  Administered 2014-10-19: 1000 mg via INTRAVENOUS

## 2014-10-19 MED ORDER — HYDROCHLOROTHIAZIDE 25 MG PO TABS
25.0000 mg | ORAL_TABLET | Freq: Every day | ORAL | Status: DC
  Administered 2014-10-19 – 2014-10-25 (×7): 25 mg via ORAL
  Filled 2014-10-19 (×7): qty 1

## 2014-10-19 MED ORDER — PROPOFOL 10 MG/ML IV BOLUS
INTRAVENOUS | Status: DC | PRN
  Administered 2014-10-19: 200 mg via INTRAVENOUS

## 2014-10-19 MED ORDER — LACTATED RINGERS IV SOLN
INTRAVENOUS | Status: DC | PRN
  Administered 2014-10-19: 07:00:00 via INTRAVENOUS

## 2014-10-19 MED ORDER — HYDROMORPHONE HCL 1 MG/ML IJ SOLN
INTRAMUSCULAR | Status: DC | PRN
  Administered 2014-10-19: .6 mg via INTRAVENOUS
  Administered 2014-10-19 (×2): .2 mg via INTRAVENOUS

## 2014-10-19 MED ORDER — HYDROMORPHONE HCL 1 MG/ML IJ SOLN
INTRAMUSCULAR | Status: AC
  Administered 2014-10-19: 0.25 mg via INTRAVENOUS
  Filled 2014-10-19: qty 1

## 2014-10-19 MED ORDER — EPHEDRINE SULFATE 50 MG/ML IJ SOLN
INTRAMUSCULAR | Status: DC | PRN
  Administered 2014-10-19 (×2): 10 mg via INTRAVENOUS

## 2014-10-19 MED ORDER — SODIUM CHLORIDE 0.9 % IV SOLN
INTRAVENOUS | Status: DC
  Administered 2014-10-19: 07:00:00 via INTRAVENOUS

## 2014-10-19 MED ORDER — SUCCINYLCHOLINE CHLORIDE 20 MG/ML IJ SOLN
INTRAMUSCULAR | Status: DC | PRN
  Administered 2014-10-19: 100 mg via INTRAVENOUS

## 2014-10-19 MED ORDER — HEPARIN SODIUM (PORCINE) 5000 UNIT/ML IJ SOLN
5000.0000 [IU] | Freq: Three times a day (TID) | INTRAMUSCULAR | Status: DC
  Administered 2014-10-19 – 2014-10-25 (×18): 5000 [IU] via SUBCUTANEOUS
  Filled 2014-10-19 (×19): qty 1

## 2014-10-19 MED ORDER — SODIUM CHLORIDE 0.9 % IR SOLN
Status: DC | PRN
  Administered 2014-10-19: 2000 mL

## 2014-10-19 MED ORDER — DEXAMETHASONE SODIUM PHOSPHATE 4 MG/ML IJ SOLN
INTRAMUSCULAR | Status: DC | PRN
  Administered 2014-10-19: 5 mg via INTRAVENOUS

## 2014-10-19 MED ORDER — LIDOCAINE HCL (PF) 1 % IJ SOLN
INTRAMUSCULAR | Status: AC
  Filled 2014-10-19: qty 30

## 2014-10-19 SURGICAL SUPPLY — 62 items
BLADE SURG SZ10 CARB STEEL (BLADE) ×6 IMPLANT
BLADE SURG SZ11 CARB STEEL (BLADE) ×3 IMPLANT
CANISTER SUCT 1200ML W/VALVE (MISCELLANEOUS) ×3 IMPLANT
CATH TRAY 16F METER LATEX (MISCELLANEOUS) ×3 IMPLANT
CLEANER CAUTERY TIP 5X5 PAD (MISCELLANEOUS) IMPLANT
CLIP TI LARGE 6 (CLIP) IMPLANT
CLIP TI MEDIUM 6 (CLIP) IMPLANT
DRAPE LEGGINS SURG 28X43 STRL (DRAPES) ×3 IMPLANT
DRAPE SHEET LG 3/4 BI-LAMINATE (DRAPES) ×3 IMPLANT
DRESSING TELFA 4X3 1S ST N-ADH (GAUZE/BANDAGES/DRESSINGS) ×3 IMPLANT
DRSG OPSITE POSTOP 4X10 (GAUZE/BANDAGES/DRESSINGS) ×3 IMPLANT
DRSG OPSITE POSTOP 4X8 (GAUZE/BANDAGES/DRESSINGS) IMPLANT
DRSG TEGADERM 2-3/8X2-3/4 SM (GAUZE/BANDAGES/DRESSINGS) ×3 IMPLANT
ELECT BLADE 6.5 EXT (BLADE) ×3 IMPLANT
GAUZE SPONGE 4X4 12PLY STRL (GAUZE/BANDAGES/DRESSINGS) IMPLANT
GELPORT LAPAROSCOPIC (MISCELLANEOUS) IMPLANT
GLOVE BIO SURGEON STRL SZ7.5 (GLOVE) ×36 IMPLANT
GOWN STRL REUS W/ TWL LRG LVL3 (GOWN DISPOSABLE) ×8 IMPLANT
GOWN STRL REUS W/TWL LRG LVL3 (GOWN DISPOSABLE) ×16
HANDLE YANKAUER SUCT BULB TIP (MISCELLANEOUS) ×3 IMPLANT
IRRIGATION STRYKERFLOW (MISCELLANEOUS) IMPLANT
IRRIGATOR STRYKERFLOW (MISCELLANEOUS)
IV NS 1000ML (IV SOLUTION)
IV NS 1000ML BAXH (IV SOLUTION) IMPLANT
JELLY LUB 2OZ STRL (MISCELLANEOUS) ×2
JELLY LUBE 2OZ STRL (MISCELLANEOUS) ×1 IMPLANT
KIT RM TURNOVER STRD PROC AR (KITS) ×3 IMPLANT
LABEL OR SOLS (LABEL) ×3 IMPLANT
NDL SAFETY 25GX1.5 (NEEDLE) ×3 IMPLANT
NS IRRIG 1000ML POUR BTL (IV SOLUTION) ×3 IMPLANT
PACK COLON CLEAN CLOSURE (MISCELLANEOUS) ×3 IMPLANT
PACK LAP CHOLECYSTECTOMY (MISCELLANEOUS) ×3 IMPLANT
PAD CLEANER CAUTERY TIP 5X5 (MISCELLANEOUS)
PAD GROUND ADULT SPLIT (MISCELLANEOUS) ×3 IMPLANT
PAD PREP 24X41 OB/GYN DISP (PERSONAL CARE ITEMS) ×3 IMPLANT
PENCIL ELECTRO HAND CTR (MISCELLANEOUS) ×3 IMPLANT
RELOAD PROXIMATE 75MM BLUE (ENDOMECHANICALS) ×6 IMPLANT
SCISSORS METZENBAUM CVD 33 (INSTRUMENTS) IMPLANT
SHEARS HARMONIC ACE PLUS 36CM (ENDOMECHANICALS) ×3 IMPLANT
SLEEVE ENDOPATH XCEL 5M (ENDOMECHANICALS) ×6 IMPLANT
SOL PREP PVP 2OZ (MISCELLANEOUS) ×3
SOLUTION PREP PVP 2OZ (MISCELLANEOUS) ×1 IMPLANT
SPONGE LAP 18X18 5 PK (GAUZE/BANDAGES/DRESSINGS) ×3 IMPLANT
STAPLER GUN LINEAR PROX 60 (STAPLE) ×3 IMPLANT
STAPLER PROXIMATE 75MM BLUE (STAPLE) ×3 IMPLANT
STAPLER SKIN PROX 35W (STAPLE) ×3 IMPLANT
SUT ETHILON 4-0 (SUTURE)
SUT ETHILON 4-0 FS2 18XMFL BLK (SUTURE)
SUT NYLON 2-0 (SUTURE) IMPLANT
SUT PDS AB 1 TP1 96 (SUTURE) ×6 IMPLANT
SUT SILK 3-0 (SUTURE) ×5 IMPLANT
SUT SILK 3-0 SH-1 18XCR BRD (SUTURE) ×1
SUT VIC AB 0 UR5 27 (SUTURE) ×3 IMPLANT
SUT VIC AB 3-0 SH 27 (SUTURE) ×2
SUT VIC AB 3-0 SH 27X BRD (SUTURE) ×1 IMPLANT
SUTURE ETHLN 4-0 FS2 18XMF BLK (SUTURE) IMPLANT
SUTURE SILK 3-0 SH-1 18XCR BRD (SUTURE) ×1 IMPLANT
SYRINGE 10CC LL (SYRINGE) ×3 IMPLANT
TROCAR XCEL BLUNT TIP 100MML (ENDOMECHANICALS) ×3 IMPLANT
TROCAR XCEL NON-BLD 5MMX100MML (ENDOMECHANICALS) ×3 IMPLANT
TUBING INSUFFLATOR HEATED (MISCELLANEOUS) ×3 IMPLANT
WATER STERILE IRR 1000ML POUR (IV SOLUTION) IMPLANT

## 2014-10-19 NOTE — Anesthesia Procedure Notes (Signed)
Procedure Name: Intubation Date/Time: 10/19/2014 7:39 AM Performed by: Justus Memory Pre-anesthesia Checklist: Patient identified, Emergency Drugs available, Suction available, Patient being monitored and Timeout performed Patient Re-evaluated:Patient Re-evaluated prior to inductionOxygen Delivery Method: Circle system utilized Preoxygenation: Pre-oxygenation with 100% oxygen Intubation Type: IV induction and Rapid sequence Laryngoscope Size: Mac and 3 Grade View: Grade II Tube type: Oral Tube size: 7.0 mm Number of attempts: 1 Airway Equipment and Method: Stylet and Patient positioned with wedge pillow Placement Confirmation: ETT inserted through vocal cords under direct vision,  positive ETCO2,  CO2 detector and breath sounds checked- equal and bilateral Secured at: 21 cm Tube secured with: Tape Dental Injury: Teeth and Oropharynx as per pre-operative assessment

## 2014-10-19 NOTE — Progress Notes (Signed)
Surgery Progress Note  I have seen and evaluated patient.  No acute changes.  Proceed with surgery.

## 2014-10-19 NOTE — Anesthesia Postprocedure Evaluation (Signed)
  Anesthesia Post-op Note  Patient: Edward Harris  Procedure(s) Performed: Procedure(s): LAPAROSCOPIC ASSISTED  SIGMOID COLECTOMY (N/A)  Anesthesia type:General  Patient location: PACU  Post pain: Pain level controlled  Post assessment: Post-op Vital signs reviewed, Patient's Cardiovascular Status Stable, Respiratory Function Stable, Patent Airway and No signs of Nausea or vomiting  Post vital signs: Reviewed and stable  Last Vitals:  Filed Vitals:   10/19/14 1250  BP: 145/86  Pulse: 90  Temp: 36.9 C  Resp:     Level of consciousness: awake, alert  and patient cooperative  Complications: No apparent anesthesia complications

## 2014-10-19 NOTE — Brief Op Note (Signed)
10/19/2014  10:29 AM  PATIENT:  Edward Harris  39 y.o. male  PRE-OPERATIVE DIAGNOSIS:  Sigmoid diverticulitis  POST-OPERATIVE DIAGNOSIS:  sigmoid diverticulitis  PROCEDURE:  Procedure(s): LAPAROSCOPIC ASSISTED  SIGMOID COLECTOMY (N/A)  SURGEON:  Surgeon(s) and Role:    * Marlyce Huge, MD - Primary    * Nestor Lewandowsky, MD - Assisting  PHYSICIAN ASSISTANT:   ASSISTANTS: none   ANESTHESIA:   none  EBL:  Total I/O In: 2500 [I.V.:2500] Out: 250 [Urine:150; Blood:100]  BLOOD ADMINISTERED:none  DRAINS: none   LOCAL MEDICATIONS USED:  NONE  SPECIMEN:  Excision  DISPOSITION OF SPECIMEN:  PATHOLOGY  COUNTS:  YES  TOURNIQUET:  * No tourniquets in log *  DICTATION: .Note written in EPIC  PLAN OF CARE: Admit to inpatient   PATIENT DISPOSITION:  PACU - hemodynamically stable.   Delay start of Pharmacological VTE agent (>24hrs) due to surgical blood loss or risk of bleeding: yes

## 2014-10-19 NOTE — Transfer of Care (Signed)
Immediate Anesthesia Transfer of Care Note  Patient: Edward Harris  Procedure(s) Performed: Procedure(s): LAPAROSCOPIC ASSISTED  SIGMOID COLECTOMY (N/A)  Patient Location: PACU  Anesthesia Type:General  Level of Consciousness: awake, alert  and oriented  Airway & Oxygen Therapy: Patient Spontanous Breathing and Patient connected to face mask oxygen  Post-op Assessment: Report given to RN and Post -op Vital signs reviewed and stable  Post vital signs: Reviewed and stable  Last Vitals:  Filed Vitals:   10/19/14 1036  BP: 132/69  Pulse: 100  Temp: 37.4 C  Resp: 18    Complications: No apparent anesthesia complications

## 2014-10-19 NOTE — Anesthesia Preprocedure Evaluation (Addendum)
Anesthesia Evaluation  Patient identified by MRN, date of birth, ID band Patient awake    Reviewed: Allergy & Precautions, NPO status , Patient's Chart, lab work & pertinent test results  History of Anesthesia Complications Negative for: history of anesthetic complications  Airway Mallampati: III  TM Distance: >3 FB Neck ROM: Full    Dental no notable dental hx.    Pulmonary neg pulmonary ROS, former smoker,  breath sounds clear to auscultation  Pulmonary exam normal       Cardiovascular Exercise Tolerance: Good hypertension, Pt. on medications Normal cardiovascular examRhythm:Regular Rate:Normal     Neuro/Psych negative neurological ROS  negative psych ROS   GI/Hepatic Neg liver ROS, GERD-  Medicated and Controlled,IBS, diverticulosis   Endo/Other  negative endocrine ROS  Renal/GU negative Renal ROS  negative genitourinary   Musculoskeletal  (+) Arthritis -,   Abdominal   Peds negative pediatric ROS (+)  Hematology negative hematology ROS (+)   Anesthesia Other Findings   Reproductive/Obstetrics negative OB ROS                            Anesthesia Physical Anesthesia Plan  ASA: III  Anesthesia Plan: General   Post-op Pain Management:    Induction: Intravenous  Airway Management Planned: Oral ETT  Additional Equipment:   Intra-op Plan:   Post-operative Plan: Extubation in OR  Informed Consent: I have reviewed the patients History and Physical, chart, labs and discussed the procedure including the risks, benefits and alternatives for the proposed anesthesia with the patient or authorized representative who has indicated his/her understanding and acceptance.   Dental advisory given  Plan Discussed with: CRNA and Surgeon  Anesthesia Plan Comments:         Anesthesia Quick Evaluation

## 2014-10-19 NOTE — OR Nursing (Signed)
Sacral patch sent to OR with patient, patient abdomen clipped and wiped with CHG wipes. SCDs on patient. Bair hugger on patient while in pre-op.

## 2014-10-20 LAB — COMPREHENSIVE METABOLIC PANEL
ALBUMIN: 3.5 g/dL (ref 3.5–5.0)
ALT: 26 U/L (ref 17–63)
AST: 15 U/L (ref 15–41)
Alkaline Phosphatase: 72 U/L (ref 38–126)
Anion gap: 5 (ref 5–15)
BUN: 14 mg/dL (ref 6–20)
CALCIUM: 8.3 mg/dL — AB (ref 8.9–10.3)
CO2: 26 mmol/L (ref 22–32)
CREATININE: 0.78 mg/dL (ref 0.61–1.24)
Chloride: 104 mmol/L (ref 101–111)
GFR calc Af Amer: >60 mL/min (ref >60)
Glucose, Bld: 111 mg/dL — ABNORMAL HIGH (ref 65–99)
Potassium: 3.7 mmol/L (ref 3.5–5.1)
SODIUM: 135 mmol/L (ref 135–145)
TOTAL PROTEIN: 6.4 g/dL — AB (ref 6.5–8.1)
Total Bilirubin: 1.9 mg/dL — ABNORMAL HIGH (ref 0.3–1.2)

## 2014-10-20 LAB — CBC
HEMATOCRIT: 39.5 % — AB (ref 40.0–52.0)
Hemoglobin: 13.3 g/dL (ref 13.0–18.0)
MCH: 28.5 pg (ref 26.0–34.0)
MCHC: 33.6 g/dL (ref 32.0–36.0)
MCV: 84.8 fL (ref 80.0–100.0)
Platelets: 271 10*3/uL (ref 150–440)
RBC: 4.66 MIL/uL (ref 4.40–5.90)
RDW: 13.5 % (ref 11.5–14.5)
WBC: 13.3 10*3/uL — ABNORMAL HIGH (ref 3.8–10.6)

## 2014-10-20 NOTE — Progress Notes (Signed)
Initial Nutrition Assessment      INTERVENTION:   Meals and snacks: Cater to pt prefences Nutrition Supplement therapy: if unable to progress pt past clear liquids within 48 hr will add boost breeze for added nutrition  NUTRITION DIAGNOSIS:   Inadequate oral intake related to altered GI function as evidenced by  (NPO/CL diet).    GOAL:   Patient will meet greater than or equal to 90% of their needs    MONITOR:    (Energy intake, Digestive system)  REASON FOR ASSESSMENT:   Malnutrition Screening Tool    ASSESSMENT:   Pt s/p sigmoid colectomy secondary to diverticulitis   Past Medical History  Diagnosis Date  . Diverticulitis   . Hypertension   . IBS (irritable bowel syndrome)   . Nephrolithiasis   . Arthritis     ankles  . Wears contact lenses      Current Nutrition: sipping on water and ate little bit of popsicle  Food/Nutrition-Related History: pt reports eating less than 50% of meals for about 1 week prior to admission secondary to vomiting, nausea, and abdominal pain   Medications: NS at 145ml/hr   Electrolyte/Renal Profile and Glucose Profile:   Recent Labs Lab 10/15/14 0830 10/19/14 1352 10/20/14 0452  NA 137  --  135  K 4.0  --  3.7  CL 98*  --  104  CO2 26  --  26  BUN 16  --  14  CREATININE 0.92 0.91 0.78  CALCIUM 9.7  --  8.3*  GLUCOSE 189*  --  111*   Protein Profile:  Recent Labs Lab 10/15/14 0830 10/20/14 0452  ALBUMIN 4.7 3.5     Last BM: 7/15   Nutrition-Focused Physical Exam Findings:  Unable to complete Nutrition-Focused physical exam at this time.     Weight Change: Per wt encounters 8 % weight loss in the last 2 months    Diet Order:  Diet clear liquid Room service appropriate?: Yes; Fluid consistency:: Thin  Skin:  Reviewed, no issues     Height:   Ht Readings from Last 1 Encounters:  10/19/14 5\' 9"  (1.753 m)    Weight:   Wt Readings from Last 1 Encounters:  10/19/14 214 lb (97.07 kg)     Ideal Body Weight:     Wt Readings from Last 10 Encounters:  10/19/14 214 lb (97.07 kg)  10/15/14 214 lb (97.07 kg)  10/05/14 214 lb 12.8 oz (97.433 kg)  10/03/14 213 lb (96.616 kg)  09/26/14 215 lb (97.523 kg)  09/23/14 220 lb (99.791 kg)  09/07/14 221 lb (100.245 kg)  08/19/14 234 lb 3.2 oz (106.232 kg)    BMI:  Body mass index is 31.59 kg/(m^2).  Estimated Nutritional Needs:   Kcal:  Using IBW of 73kg (BEE 1645 kcals (IF 1.0-1.2, AF 1.3) 2138-2566 kcals/d  Protein:  (1.2-1.5 g/d) 88-110 gm/d  Fluid:  (25-92ml/kg) 1825-2168ml/d  EDUCATION NEEDS:   No education needs identified at this time  HIGH Care Level  Demond Shallenberger B. Zenia Resides, [place], [place] (pager)

## 2014-10-20 NOTE — Progress Notes (Signed)
Surgery progress note  S: Mild abd pain, no flatus O: AF/VSS, good uop GEN: NAD/A&Ox3 ABD: soft, mild tender, incision with dressing with some dried blood  A/P 39 yo s/p sigmoid colectomy, doing well - liquids - OOB to chair - pain control - await bowel function

## 2014-10-20 NOTE — Op Note (Signed)
Preoperative diagnosis: Recurrent sigmoid diverticulitis Postoperative diagnosis: Same Procedure performed: Laparoscopic assisted sigmoid colectomy  Surgeon: Diamante Truszkowski Assistant: Oaks  Anesthesia: General Endotracheal EBL: 100 ml Specimen: Sigmoid colon  Indication for surgery: Edward Harris is a pleasant 39 yo F who presented with multiple bouts of sigmoid diverticulitis.  He was brought to the OR for surgical management of recurrent sigmoid diverticulitis  Details of procedure: Informed consent was obtained.  Edward Harris was brought to the operating room suite.  He was induced, ETT placed, general anesthesia administered.    Abdomen prepped and draped in the standard surgical fashion and a timeout was performed correctly identifying patient name, operative site and procedure to be performed.  A supraumbilical incision was made and deepened to the fascia.  The fascia was incised and the peritoneum was entered.  Two stay sutures were placed in the fasciotomy.  A hassan trocar was placed in the abdomen and it was insufflated.  Under direct visualization, a LLQ and RLQ 40mm trocar were placed.  The retroperitoneal attachments of the sigmoid and ascending colon were mobilized using a harmonic scalpel.  There was an approx 15 cm area of intestine around the descending-sigmoid junction which was remarkably hard and scarred.  Once I felt the colon was sufficently mobilized, I made a longitudinal midline incision.  I then brought the diseased part of colon out of the incision.  I then divided the healthy colon proximal and distal to this thickened part with two firings of a GIA-75 stapler.  I ligated the mesocolon with a a combination of suture ties and harmonic scalpel.  I then attempted to bring together the cut ends for an anastamosis and found that the proximal colon did not have enough length.  I then mobilized it more and partially took down the splenic flexure.  I then performed a side to side,  functionally end to end anastamosis using a single fire of a GIA-75 to make a common channel and a TX-60 to close the common colotomy.  I then irrigated the abdomen with 3 liters of normal saline and obtained hemostasis.  I then closed the abdomen with two #1 looped PDS sutures ran from each end and tied to the middle.  A sterile dressing was placed over the wound.  The patient was then awoken, extubated and brought to the PACU.  There were no immediate complications.  The needle, sponge and instrument counts were correct at the end of the procedure.

## 2014-10-21 MED ORDER — KCL IN DEXTROSE-NACL 20-5-0.45 MEQ/L-%-% IV SOLN
INTRAVENOUS | Status: DC
  Administered 2014-10-21 – 2014-10-24 (×7): via INTRAVENOUS
  Filled 2014-10-21 (×11): qty 1000

## 2014-10-21 NOTE — Progress Notes (Signed)
Patient A/O, Patient pain has been managed by his regimen as needed (see EMAR). Patient spouse was educated the prn pain regimen. She stated staff suppose administered the medication every 3 hours. Educated the spouse and patient prn regimen are not administered while patient is sleep and nursing staff do not wake patient to give pain regimen. Spouse has a hard time comprehending. She notes to have an impulsive behavior. Spouse made a comment noting when I left the room I should have retrieved the patient pain regimen. The spouse was informed "As I spend time with you addressing concerns appropriately and patiently, I treat other patients and families with the same respect." Spouse then apologize. The patient and spouse concerns was address concerning the dietitian visit, I educated them noting the goal is to get the patient to have more intake at 90% and Boost breeze would be added to his supplement (appx 2200). Ambulated in the hallway x1, encouraged patient to use pillow to assist with mobility and position change to decrease the pain. No change in drainage, drainage marked. Staff will continue educated and address concerns. Managed his pain according to the MD's order. The spouse ask why the patient's BP was low at 0114am, she ask was it because of the surgery, because he normally has high blood pressure. Spouse and Patient was educated on the stabilize vitals, "his vital are low resulted to staff administering is blood medication on routine schedule." Staff will continue to meet the patient's, as well as his spouse.

## 2014-10-21 NOTE — Progress Notes (Signed)
Surgery Progress Note  S: Pain controlled, ambulating, good uop O: Blood pressure 132/82, pulse 82, temperature 99.1 F (37.3 C), temperature source Oral, resp. rate 17, height 5\' 9"  (1.753 m), weight 97.07 kg (214 lb), SpO2 98 %. GEN: NAD/A&Ox3 ABD: soft, min tender, nondistended, incision c/d/i with staples  A/P 39 yo s/p sigmoid colectomy, doing well - liquids - maintenance IVF - ambulate

## 2014-10-22 LAB — SURGICAL PATHOLOGY

## 2014-10-22 NOTE — Progress Notes (Signed)
3 Days Post-Op  Subjective: Postop colon resection. Patient has no complaints at this point he has not passed gas yet. Nausea or vomiting.  Objective: Vital signs in last 24 hours: Temp:  [97.8 F (36.6 C)-98.2 F (36.8 C)] 97.8 F (36.6 C) (07/18 0757) Pulse Rate:  [73-86] 76 (07/18 0757) Resp:  [16-17] 16 (07/18 0757) BP: (109-128)/(62-75) 109/62 mmHg (07/18 0757) SpO2:  [97 %-98 %] 98 % (07/18 0757) Last BM Date: 10/19/14  Intake/Output from previous day: 07/17 0701 - 07/18 0700 In: 2421.8 [I.V.:2421.8] Out: 2050 [Urine:2050] Intake/Output this shift: Total I/O In: 222 [I.V.:222] Out: 725 [Urine:725]  Physical exam:  Soft nontender abdomen wound is clean no erythema no drainage. Tender calves  Lab Results: CBC   Recent Labs  10/19/14 1352 10/20/14 0452  WBC 17.1* 13.3*  HGB 14.4 13.3  HCT 43.2 39.5*  PLT 271 271   BMET  Recent Labs  10/19/14 1352 10/20/14 0452  NA  --  135  K  --  3.7  CL  --  104  CO2  --  26  GLUCOSE  --  111*  BUN  --  14  CREATININE 0.91 0.78  CALCIUM  --  8.3*   PT/INR No results for input(s): LABPROT, INR in the last 72 hours. ABG No results for input(s): PHART, HCO3 in the last 72 hours.  Invalid input(s): PCO2, PO2  Studies/Results: No results found.  Anti-infectives: Anti-infectives    Start     Dose/Rate Route Frequency Ordered Stop   10/19/14 0044  ertapenem (INVANZ) 1 g in sodium chloride 0.9 % 50 mL IVPB     1 g 100 mL/hr over 30 Minutes Intravenous On call to O.R. 10/19/14 0044 10/19/14 0733      Assessment/Plan: s/p Procedure(s): LAPAROSCOPIC ASSISTED  SIGMOID COLECTOMY    Patient doing very well but will hold off and advancing diet until bowel function returns.  Florene Glen, MD, FACS  10/22/2014

## 2014-10-23 ENCOUNTER — Telehealth: Payer: Self-pay | Admitting: Surgery

## 2014-10-23 NOTE — Telephone Encounter (Signed)
Please call Edward Harris, his wife, regarding a progress note you sent to the insurance company. It is confusing to her.

## 2014-10-23 NOTE — Telephone Encounter (Signed)
Called Edward Harris at this time. Explained that we cannot predict exactly what day the patient will return to work as we do not know how quickly he will recover, we can only give an estimate. We will update this at appointments as time goes by and then official date of return will be determined with at end of post-op period.

## 2014-10-23 NOTE — Progress Notes (Signed)
4 Days Post-Op  Subjective: Soft colon resection. Feels better today is passing gas but not having bowel movements has no nausea or vomiting.  Objective: Vital signs in last 24 hours: Temp:  [97.4 F (36.3 C)-100 F (37.8 C)] 100 F (37.8 C) (07/19 0827) Pulse Rate:  [82-84] 84 (07/19 0827) Resp:  [16-20] 18 (07/19 0827) BP: (116-138)/(65-82) 138/82 mmHg (07/19 0827) SpO2:  [100 %] 100 % (07/19 0827) Last BM Date: 10/19/14  Intake/Output from previous day: 07/18 0701 - 07/19 0700 In: 1934 [I.V.:1934] Out: 2425 [Urine:2425] Intake/Output this shift: Total I/O In: 0  Out: 400 [Urine:400]  Physical exam:  Soft nontender abdomen wound is clean. Calves are nontender.  Lab Results: CBC  No results for input(s): WBC, HGB, HCT, PLT in the last 72 hours. BMET No results for input(s): NA, K, CL, CO2, GLUCOSE, BUN, CREATININE, CALCIUM in the last 72 hours. PT/INR No results for input(s): LABPROT, INR in the last 72 hours. ABG No results for input(s): PHART, HCO3 in the last 72 hours.  Invalid input(s): PCO2, PO2  Studies/Results: No results found.  Anti-infectives: Anti-infectives    Start     Dose/Rate Route Frequency Ordered Stop   10/19/14 0044  ertapenem (INVANZ) 1 g in sodium chloride 0.9 % 50 mL IVPB     1 g 100 mL/hr over 30 Minutes Intravenous On call to O.R. 10/19/14 0044 10/19/14 0733      Assessment/Plan: s/p Procedure(s): LAPAROSCOPIC ASSISTED  SIGMOID COLECTOMY   Will advance diet today to full liquids continue IV fluids possibly advance diet tomorrow and home soon.Florene Glen, MD, FACS  10/23/2014

## 2014-10-23 NOTE — Progress Notes (Signed)
Nutrition Follow-up  Documentation Codes:  Severe malnutrition in context of acute illness/injury  INTERVENTION:   Meals and snacks: Cater to pt preferences Nutrition Supplement Therapy: Will add mightyshake BID for added nutrition  NUTRITION DIAGNOSIS:   Inadequate oral intake related to altered GI function as evidenced by  (NPO/CL diet).    GOAL:   Patient will meet greater than or equal to 90% of their needs    MONITOR:    (Energy intake, Digestive system)  REASON FOR ASSESSMENT:   Malnutrition Screening Tool    ASSESSMENT:   Pt s/p sigmoid colectomy secondary to diverticulitis   Current Nutrition: tolerating liquids, limited nutrition day 4 of admission Prior to admission eating < 50% of meals for about 1 week  Gastrointestinal profile: hypoactive bowel sounds, no flatus,  Last BM: none following surgery   Medications:   Electrolyte/Renal Profile and Glucose Profile:   Recent Labs Lab 10/19/14 1352 10/20/14 0452  NA  --  135  K  --  3.7  CL  --  104  CO2  --  26  BUN  --  14  CREATININE 0.91 0.78  CALCIUM  --  8.3*  GLUCOSE  --  111*   Protein Profile:   Recent Labs Lab 10/20/14 0452  ALBUMIN 3.5     Weight Trend since Admission: Filed Weights   10/19/14 0615  Weight: 214 lb (97.07 kg)      Diet Order:  Diet full liquid Room service appropriate?: Yes; Fluid consistency:: Thin  Skin:  Reviewed, no issues   Height:   Ht Readings from Last 1 Encounters:  10/19/14 5\' 9"  (1.753 m)    Weight:   Wt Readings from Last 1 Encounters:  10/19/14 214 lb (97.07 kg)    Ideal Body Weight:     Wt Readings from Last 10 Encounters:  10/19/14 214 lb (97.07 kg)  10/15/14 214 lb (97.07 kg)  10/05/14 214 lb 12.8 oz (97.433 kg)  10/03/14 213 lb (96.616 kg)  09/26/14 215 lb (97.523 kg)  09/23/14 220 lb (99.791 kg)  09/07/14 221 lb (100.245 kg)  08/19/14 234 lb 3.2 oz (106.232 kg)    BMI:  Body mass index is 31.59  kg/(m^2).  Estimated Nutritional Needs:   Kcal:  Using IBW of 73kg (BEE 1645 kcals (IF 1.0-1.2, AF 1.3) 2138-2566 kcals/d  Protein:  (1.2-1.5 g/d) 88-110 gm/d  Fluid:  (25-6ml/kg) 1825-2178ml/d  EDUCATION NEEDS:   No education needs identified at this time  [place]. Zenia Resides, [place], [place] (pager)

## 2014-10-24 ENCOUNTER — Encounter: Payer: Self-pay | Admitting: Surgery

## 2014-10-24 LAB — PLATELET COUNT: Platelets: 323 10*3/uL (ref 150–440)

## 2014-10-24 NOTE — Progress Notes (Signed)
5 Days Post-Op  Subjective: Patient feels well today he is not eating very much and is apprehensive about advancing his diet. He is seen gas and having no nausea or vomiting.  Objective: Vital signs in last 24 hours: Temp:  [98.1 F (36.7 C)-98.6 F (37 C)] 98.1 F (36.7 C) (07/20 0818) Pulse Rate:  [63-74] 69 (07/20 0818) Resp:  [16-20] 16 (07/20 0818) BP: (113-129)/(64-72) 113/68 mmHg (07/20 0818) SpO2:  [98 %-100 %] 99 % (07/20 0818) Last BM Date: 10/19/14  Intake/Output from previous day: 07/19 0701 - 07/20 0700 In: 3755 [P.O.:1100; I.V.:2655] Out: 2700 [Urine:2700] Intake/Output this shift: Total I/O In: 846 [P.O.:400; I.V.:446] Out: 250 [Urine:250]  Physical exam:  Abdomen is soft nontender wounds are clean without erythema or drainage calves are nontender    Lab Results: CBC   Recent Labs  10/24/14 0434  PLT 323   BMET No results for input(s): NA, K, CL, CO2, GLUCOSE, BUN, CREATININE, CALCIUM in the last 72 hours. PT/INR No results for input(s): LABPROT, INR in the last 72 hours. ABG No results for input(s): PHART, HCO3 in the last 72 hours.  Invalid input(s): PCO2, PO2  Studies/Results: No results found.  Anti-infectives: Anti-infectives    Start     Dose/Rate Route Frequency Ordered Stop   10/19/14 0044  ertapenem (INVANZ) 1 g in sodium chloride 0.9 % 50 mL IVPB     1 g 100 mL/hr over 30 Minutes Intravenous On call to O.R. 10/19/14 0044 10/19/14 0733      Assessment/Plan: s/p Procedure(s): LAPAROSCOPIC ASSISTED  SIGMOID COLECTOMY   Patient doing very well at this point we'll advance diet slowly probably home tomorrow if he continues to improve*  Florene Glen, MD, Allegra Grana  10/24/2014

## 2014-10-24 NOTE — Clinical Documentation Improvement (Signed)
Possible Clinical Conditions?  Severe Malnutrition   Protein Calorie Malnutrition Severe Protein Calorie Malnutrition Other Condition Cannot clinically determine  Supporting Information: Nutrition Follow-up by Jennet Maduro, RD at 2014-11-14 2:19 PM  Documentation Codes: Severe malnutrition in context of acute illness/injury  INTERVENTION:  Meals and snacks: Cater to pt preferences  Nutrition Supplement Therapy: Will add mightyshake BID for added nutrition  NUTRITION DIAGNOSIS: Inadequate oral intake related to altered GI function as evidenced by (NPO/CL diet).    Thank You, Alessandra Grout, RN, BSN, CCDS,Clinical Documentation Specialist:  [phone]  [phone]=Cell [place]- Health Information Management

## 2014-10-25 MED ORDER — OXYCODONE-ACETAMINOPHEN 5-325 MG PO TABS: 1.0000 | ORAL_TABLET | ORAL | Status: DC | PRN

## 2014-10-25 NOTE — Progress Notes (Signed)
Pt d/c'd to home today. IV removed & intact. Pt's discharge paperwork reviewed with all questions & concerns addressed. Rx's handed to pt and medication education reviewed with all questions & concerns addressed. Infection prevention education presented with all questions and concern addressed. Pt has wife at bedside to transport home. Volunteer services utilized to transport pt downstairs via wheelchair.

## 2014-10-25 NOTE — Progress Notes (Signed)
6 Days Post-Op  Subjective: Status post colon resection feels well tolerating a regular diet. Had a bowel movement but is passing gas  Objective: Vital signs in last 24 hours: Temp:  [98.3 F (36.8 C)-98.9 F (37.2 C)] 98.3 F (36.8 C) (07/21 0719) Pulse Rate:  [65-90] 65 (07/21 0719) Resp:  [16] 16 (07/21 0719) BP: (115-140)/(69-76) 115/69 mmHg (07/21 0719) SpO2:  [96 %-100 %] 96 % (07/21 0719) Last BM Date: 10/19/14  Intake/Output from previous day: 07/20 0701 - 07/21 0700 In: 1573 [P.O.:1000; I.V.:573] Out: 775 [Urine:775] Intake/Output this shift: Total I/O In: 120 [P.O.:120] Out: 250 [Urine:250]  Physical exam:  Soft nontender abdomen wound clean without erythema staples in place  Lab Results: CBC   Recent Labs  10/24/14 0434  PLT 323   BMET No results for input(s): NA, K, CL, CO2, GLUCOSE, BUN, CREATININE, CALCIUM in the last 72 hours. PT/INR No results for input(s): LABPROT, INR in the last 72 hours. ABG No results for input(s): PHART, HCO3 in the last 72 hours.  Invalid input(s): PCO2, PO2  Studies/Results: No results found.  Anti-infectives: Anti-infectives    Start     Dose/Rate Route Frequency Ordered Stop   10/19/14 0044  ertapenem (INVANZ) 1 g in sodium chloride 0.9 % 50 mL IVPB     1 g 100 mL/hr over 30 Minutes Intravenous On call to O.R. 10/19/14 0044 10/19/14 0733      Assessment/Plan: s/p Procedure(s): LAPAROSCOPIC ASSISTED  SIGMOID COLECTOMY   Patient doing very well on a regular diet we'll discharge today to follow-up in 10 days on oral analgesia 6.  Florene Glen, MD, FACS  10/25/2014

## 2014-10-25 NOTE — Progress Notes (Signed)
Nutrition Education Note  RD consulted for nutrition education regarding a Low Fiber/Low Residue diet.   RD provided "Low Fiber Nutrition Therapy" handout from the Academy of [employer] and [employer]etics. Discussed nutrition therapy to reduce the irritation of the gastrointestinal tract to promote healing. Provided list of recommended low fiberous foods in comparison to foods with high amounts of fiber, as well as a recommended sample day. Teach back method used.  Expect good compliance.  Body mass index is 31.59 kg/(m^2).   Current diet order is soft, patient is consuming approximately 75-100 % of meals at this time. Labs and medications reviewed. RD contact information provided. If additional nutrition issues arise, please re-consult RD.   Edward Harris B. Zenia Resides, [place], [place] (pager)

## 2014-10-25 NOTE — Discharge Instructions (Addendum)
May shower in 24 hours. Follow-up with Dr. Rexene Edison in 10 days.

## 2014-10-30 ENCOUNTER — Inpatient Hospital Stay
Admission: EM | Admit: 2014-10-30 | Discharge: 2014-11-04 | DRG: 948 | Disposition: A | Discharge status: Home / Self Care | Payer: Managed Care, Other (non HMO) | Attending: General Surgery | Admitting: General Surgery

## 2014-10-30 ENCOUNTER — Encounter: Payer: Self-pay | Admitting: Emergency Medicine

## 2014-10-30 ENCOUNTER — Emergency Department: Payer: Managed Care, Other (non HMO)

## 2014-10-30 DIAGNOSIS — Z9049 Acquired absence of other specified parts of digestive tract: Secondary | ICD-10-CM | POA: Diagnosis present

## 2014-10-30 DIAGNOSIS — Z888 Allergy status to other drugs, medicaments and biological substances status: Secondary | ICD-10-CM

## 2014-10-30 DIAGNOSIS — R109 Unspecified abdominal pain: Secondary | ICD-10-CM | POA: Diagnosis present

## 2014-10-30 DIAGNOSIS — I10 Essential (primary) hypertension: Secondary | ICD-10-CM | POA: Diagnosis present

## 2014-10-30 DIAGNOSIS — Z79891 Long term (current) use of opiate analgesic: Secondary | ICD-10-CM

## 2014-10-30 DIAGNOSIS — Z820 Family history of epilepsy and other diseases of the nervous system: Secondary | ICD-10-CM

## 2014-10-30 DIAGNOSIS — R1110 Vomiting, unspecified: Secondary | ICD-10-CM | POA: Diagnosis not present

## 2014-10-30 DIAGNOSIS — M1990 Unspecified osteoarthritis, unspecified site: Secondary | ICD-10-CM | POA: Diagnosis present

## 2014-10-30 DIAGNOSIS — Z800 Family history of malignant neoplasm of digestive organs: Secondary | ICD-10-CM

## 2014-10-30 DIAGNOSIS — K589 Irritable bowel syndrome without diarrhea: Secondary | ICD-10-CM | POA: Diagnosis present

## 2014-10-30 DIAGNOSIS — Z801 Family history of malignant neoplasm of trachea, bronchus and lung: Secondary | ICD-10-CM

## 2014-10-30 DIAGNOSIS — Z9889 Other specified postprocedural states: Secondary | ICD-10-CM

## 2014-10-30 DIAGNOSIS — Z79899 Other long term (current) drug therapy: Secondary | ICD-10-CM

## 2014-10-30 DIAGNOSIS — G8918 Other acute postprocedural pain: Secondary | ICD-10-CM | POA: Diagnosis not present

## 2014-10-30 DIAGNOSIS — K5010 Crohn's disease of large intestine without complications: Secondary | ICD-10-CM | POA: Diagnosis present

## 2014-10-30 DIAGNOSIS — Z8249 Family history of ischemic heart disease and other diseases of the circulatory system: Secondary | ICD-10-CM

## 2014-10-30 DIAGNOSIS — Z87442 Personal history of urinary calculi: Secondary | ICD-10-CM

## 2014-10-30 DIAGNOSIS — Z87891 Personal history of nicotine dependence: Secondary | ICD-10-CM

## 2014-10-30 HISTORY — PX: COLON RESECTION: SHX5231

## 2014-10-30 LAB — CBC
HCT: 44.1 % (ref 40.0–52.0)
Hemoglobin: 14.6 g/dL (ref 13.0–18.0)
MCH: 28.2 pg (ref 26.0–34.0)
MCHC: 33.2 g/dL (ref 32.0–36.0)
MCV: 85.1 fL (ref 80.0–100.0)
PLATELETS: 394 10*3/uL (ref 150–440)
RBC: 5.18 MIL/uL (ref 4.40–5.90)
RDW: 13.6 % (ref 11.5–14.5)
WBC: 11.7 10*3/uL — ABNORMAL HIGH (ref 3.8–10.6)

## 2014-10-30 LAB — COMPREHENSIVE METABOLIC PANEL
ALK PHOS: 86 U/L (ref 38–126)
ALT: 47 U/L (ref 17–63)
AST: 23 U/L (ref 15–41)
Albumin: 4.6 g/dL (ref 3.5–5.0)
Anion gap: 13 (ref 5–15)
BUN: 14 mg/dL (ref 6–20)
CO2: 25 mmol/L (ref 22–32)
Calcium: 9.8 mg/dL (ref 8.9–10.3)
Chloride: 94 mmol/L — ABNORMAL LOW (ref 101–111)
Creatinine, Ser: 1.05 mg/dL (ref 0.61–1.24)
GFR calc non Af Amer: >60 mL/min (ref >60)
Glucose, Bld: 139 mg/dL — ABNORMAL HIGH (ref 65–99)
POTASSIUM: 4.1 mmol/L (ref 3.5–5.1)
Sodium: 132 mmol/L — ABNORMAL LOW (ref 135–145)
TOTAL PROTEIN: 8.6 g/dL — AB (ref 6.5–8.1)
Total Bilirubin: 1 mg/dL (ref 0.3–1.2)

## 2014-10-30 LAB — LIPASE, BLOOD: Lipase: 56 U/L — ABNORMAL HIGH (ref 22–51)

## 2014-10-30 MED ORDER — ONDANSETRON HCL 4 MG/2ML IJ SOLN
4.0000 mg | Freq: Once | INTRAMUSCULAR | Status: AC
  Administered 2014-10-30: 4 mg via INTRAVENOUS
  Filled 2014-10-30: qty 2

## 2014-10-30 MED ORDER — MORPHINE SULFATE 4 MG/ML IJ SOLN
4.0000 mg | Freq: Once | INTRAMUSCULAR | Status: AC
  Administered 2014-10-30: 4 mg via INTRAVENOUS
  Filled 2014-10-30: qty 1

## 2014-10-30 MED ORDER — SODIUM CHLORIDE 0.9 % IV BOLUS (SEPSIS)
1000.0000 mL | Freq: Once | INTRAVENOUS | Status: AC
  Administered 2014-10-30: 1000 mL via INTRAVENOUS

## 2014-10-30 MED ORDER — IOHEXOL 240 MG/ML SOLN
50.0000 mL | INTRAMUSCULAR | Status: AC
  Administered 2014-10-30: 50 mL via ORAL

## 2014-10-30 MED ORDER — PROMETHAZINE HCL 25 MG/ML IJ SOLN
INTRAMUSCULAR | Status: AC
  Filled 2014-10-30: qty 1

## 2014-10-30 MED ORDER — PROMETHAZINE HCL 25 MG/ML IJ SOLN
12.5000 mg | Freq: Once | INTRAMUSCULAR | Status: AC
  Administered 2014-10-30: 12.5 mg via INTRAVENOUS

## 2014-10-30 MED ORDER — SODIUM CHLORIDE 0.9 % IV SOLN
1.0000 g | Freq: Once | INTRAVENOUS | Status: AC
  Administered 2014-10-31: 1 g via INTRAVENOUS
  Filled 2014-10-30: qty 1

## 2014-10-30 MED ORDER — IOHEXOL 300 MG/ML  SOLN
100.0000 mL | Freq: Once | INTRAMUSCULAR | Status: AC | PRN
  Administered 2014-10-30: 100 mL via INTRAVENOUS

## 2014-10-30 MED ORDER — PROMETHAZINE HCL 25 MG/ML IJ SOLN: 12.5000 mg | Freq: Once | INTRAMUSCULAR | Status: DC

## 2014-10-30 NOTE — ED Notes (Signed)
Patient back from CT.

## 2014-10-30 NOTE — Progress Notes (Signed)
Patient ID: Edward Harris, male   DOB: Jul 23, 1975, 39 y.o.   MRN: [id number]  History of Present Illness Edward Harris is a 39 y.o. male with a 2 day history of progressive worsening nausea and a one day history of emesis. He reports that he threw up 3 times today that was followed by abdominal pain near his surgical site. He is 11 days post sigmoid colectomy with side to side anastamosis that was performed for recurrent diverticulitis. He reports that he was discharged home from that surgery 5 days ago. He has been having bowel function with his last BM being this morning. The last PO that he was able to keep down was over 12 hours prior to this history. He denies any fevers or chills but has just not been "feeling right". He states that this feeling has been different than his prior diverticulitis attacks. Patient reports that he still has his staples in place from his surgery as well.  Past Medical History Past Medical History  Diagnosis Date  . Diverticulitis   . Hypertension   . IBS (irritable bowel syndrome)   . Nephrolithiasis   . Arthritis     ankles  . Wears contact lenses      Past Surgical History  Procedure Laterality Date  . Inguinal hernia repair Right   . Kidney stone surgery      extraction/lithotripsy  . Esophagogastroduodenoscopy (egd) with propofol N/A 10/03/2014    Procedure: ESOPHAGOGASTRODUODENOSCOPY (EGD) WITH PROPOFOL;  Surgeon: Lucilla Lame, MD;  Location: [facility];  Service: Endoscopy;  Laterality: N/A;  . Colonoscopy with propofol N/A 10/03/2014    Procedure: COLONOSCOPY WITH PROPOFOL;  Surgeon: Lucilla Lame, MD;  Location: [facility];  Service: Endoscopy;  Laterality: N/A;  . Laparoscopic sigmoid colectomy N/A 10/19/2014    Procedure: LAPAROSCOPIC ASSISTED  SIGMOID COLECTOMY;  Surgeon: Marlyce Huge, MD;  Location: [facility];  Service: General;  Laterality: N/A;  . Colon resection  10/30/2014    Allergies  Allergen Reactions  .  Amlodipine Other (See Comments)    Sore and numbness in arms.    Current Facility-Administered Medications  Medication Dose Route Frequency Provider Last Rate Last Dose  . ertapenem (INVANZ) 1 g in sodium chloride 0.9 % 50 mL IVPB  1 g Intravenous Once Gregor Hams, MD       Current Outpatient Prescriptions  Medication Sig Dispense Refill  . losartan-hydrochlorothiazide (HYZAAR) 100-25 MG per tablet Take 1 tablet by mouth daily.  3  . metoCLOPramide (REGLAN) 10 MG tablet Take 1 tablet (10 mg total) by mouth every 8 (eight) hours as needed for nausea or vomiting. (Patient taking differently: Take 10 mg by mouth 3 (three) times daily. ) 30 tablet 1  . oxyCODONE-acetaminophen (ROXICET) 5-325 MG per tablet Take 1 tablet by mouth every 4 (four) hours as needed for moderate pain. 30 tablet 0  . pantoprazole (PROTONIX) 40 MG tablet Take 1 tablet (40 mg total) by mouth daily. 31 tablet 2  . sucralfate (CARAFATE) 1 G tablet Take 1 g by mouth 4 (four) times daily.  0    Family History Family History  Problem Relation Age of Onset  . Hypertension Mother   . Osteoarthritis Mother   . Heart attack Father   . Hypertension Brother   . Alzheimer's disease Maternal Aunt   . Lung cancer    . Colon cancer Maternal Grandmother 75      Social History History  Substance Use Topics  .  Smoking status: Former Research scientist (life sciences)  . Smokeless tobacco: Never Used  . Alcohol Use: 0.0 oz/week    0 Standard drinks or equivalent per week     Comment: "occassional" couple drinks per month    Quit smoking 4 years ago   ROS   Physical Exam Blood pressure 145/87, pulse 102, temperature 99 F (37.2 C), temperature source Oral, resp. rate 18, height 5\' 9"  (1.753 m), weight 93.441 kg (206 lb), SpO2 100 %.  CONSTITUTIONAL: awake, alert and appropriate EYES: Pupils equal, round, and reactive to light, Sclera non-icteric. EARS, NOSE, MOUTH AND THROAT: The oropharynx is clear. Oral mucosa is pink and moist. Hearing  is intact to voice.  NECK: Trachea is midline, and there is no jugular venous distension. Thyroid is without palpable abnormalities. LYMPH NODES:  Lymph nodes in the neck are not enlarged. RESPIRATORY:  Lungs are clear, and breath sounds are equal bilaterally. Normal respiratory effort without pathologic use of accessory muscles. CARDIOVASCULAR: Heart is regular without murmurs, gallops, or rubs. GI: The abdomen is soft, and nondistended but with tenderness to palpation along his midline incision. Staples are in place to midline and left lower abdomen without any evidence of drainage or inflammation There were no palpable masses. There was no hepatosplenomegaly. There were normal bowel sounds. GU: Deffered MUSCULOSKELETAL:  Normal muscle strength and tone in all four extremities.    SKIN: Skin turgor is normal. There are no pathologic skin lesions.  NEUROLOGIC:  Motor and sensation is grossly normal.  Cranial nerves are grossly intact. PSYCH:  Alert and oriented to person, place and time. Affect is normal.  Data Reviewed CT scan reviewed with evidence of inflammation at his anastamosis without any abscess. Pan diverticulosis Labs reviewed with WBC of 11.7  I have personally reviewed the patient's imaging and medical records.    Assessment    39 year old male with regional colitis without abscess at site of recent surgical resection with anastomosis. This is either local recurrent diverticulitis or represents a small anastomotic leak  Plan    Will admit to Med-Surg IV ABX - will use invanz Keep NPO Will be able to remove staples as inpatient  Face-to-face time spent with the patient and care providers was 30 minutes, with more than 50% of the time spent counseling, educating, and coordinating care of the patient.     Edward Harris 10/30/2014, 11:51 PM

## 2014-10-30 NOTE — ED Notes (Signed)
Pt arrived to the ED for complaints of abdominal pain nausea and vomiting. Pt reports that 11 days ago he had a colon resection done by Dr. Alvino Chapel. Pt is AOx4 in mild pain distress.

## 2014-10-30 NOTE — ED Provider Notes (Signed)
[facility] Emergency Department Provider Note  ____________________________________________  Time seen: Approximately 8:52 PM  I have reviewed the triage vital signs and the nursing notes.   HISTORY  Chief Complaint Nausea and Emesis    HPI Edward Harris is a 39 y.o. male patient had surgery on his abdomen on the 15th by Dr. Rexene Edison. Patient is doing well until a couple days ago he began getting a little bit of nausea. Today he began vomiting he is unable to keep anything down even water. Patient reports his belly is achy from the pulling from vomiting. Patient says he has been hot and sweaty even sitting up to the ceiling fan with no shirt on with the air conditioner going. He is not aware of having a fever however. Abdominal Pain is moderate in nature  Past Medical History  Diagnosis Date  . Diverticulitis   . Hypertension   . IBS (irritable bowel syndrome)   . Nephrolithiasis   . Arthritis     ankles  . Wears contact lenses     Patient Active Problem List   Diagnosis Date Noted  . Benign neoplasm of sigmoid colon   . Diverticulitis of large intestine without perforation or abscess without bleeding   . Nausea with vomiting   . Esophageal hemorrhage   . Gastric catarrh   . Idiopathic colitis   . Diverticulitis of colon 09/26/2014  . Non-intractable cyclical vomiting with nausea 09/26/2014  . Diverticulitis large intestine w/o perforation or abscess w/o bleeding 09/07/2014  . Diverticulitis 08/19/2014  . Ileus 08/19/2014  . Hypertension 08/19/2014    Past Surgical History  Procedure Laterality Date  . Inguinal hernia repair Right   . Kidney stone surgery      extraction/lithotripsy  . Esophagogastroduodenoscopy (egd) with propofol N/A 10/03/2014    Procedure: ESOPHAGOGASTRODUODENOSCOPY (EGD) WITH PROPOFOL;  Surgeon: Lucilla Lame, MD;  Location: [facility];  Service: Endoscopy;  Laterality: N/A;  . Colonoscopy with propofol N/A  10/03/2014    Procedure: COLONOSCOPY WITH PROPOFOL;  Surgeon: Lucilla Lame, MD;  Location: [facility];  Service: Endoscopy;  Laterality: N/A;  . Laparoscopic sigmoid colectomy N/A 10/19/2014    Procedure: LAPAROSCOPIC ASSISTED  SIGMOID COLECTOMY;  Surgeon: Marlyce Huge, MD;  Location: [facility];  Service: General;  Laterality: N/A;  . Colon resection  10/30/2014    Current Outpatient Rx  Name  Route  Sig  Dispense  Refill  . losartan-hydrochlorothiazide (HYZAAR) 100-25 MG per tablet   Oral   Take 1 tablet by mouth daily.      3   . metoCLOPramide (REGLAN) 10 MG tablet   Oral   Take 1 tablet (10 mg total) by mouth every 8 (eight) hours as needed for nausea or vomiting. Patient taking differently: Take 10 mg by mouth 3 (three) times daily.    30 tablet   1   . oxyCODONE-acetaminophen (ROXICET) 5-325 MG per tablet   Oral   Take 1 tablet by mouth every 4 (four) hours as needed for moderate pain.   30 tablet   0   . pantoprazole (PROTONIX) 40 MG tablet   Oral   Take 1 tablet (40 mg total) by mouth daily.   31 tablet   2   . sucralfate (CARAFATE) 1 G tablet   Oral   Take 1 g by mouth 4 (four) times daily.      0     Allergies Amlodipine  Family History  Problem Relation Age of Onset  .  Hypertension Mother   . Osteoarthritis Mother   . Heart attack Father   . Hypertension Brother   . Alzheimer's disease Maternal Aunt   . Lung cancer    . Colon cancer Maternal Grandmother 75    Social History History  Substance Use Topics  . Smoking status: Former Research scientist (life sciences)  . Smokeless tobacco: Never Used  . Alcohol Use: 0.0 oz/week    0 Standard drinks or equivalent per week     Comment: "occassional" couple drinks per month    Review of Systems Constitutional: See history of present illness Eyes: No visual changes. ENT: No sore throat. Cardiovascular: Denies chest pain. Respiratory: Denies shortness of breath. Gastrointestinal: See history of present  illness. Genitourinary: Negative for dysuria. Musculoskeletal: Negative for back pain. Skin: Negative for rash. Neurological: Negative for headaches, focal weakness or numbness.  10-point ROS otherwise negative.  ____________________________________________   PHYSICAL EXAM:  VITAL SIGNS: ED Triage Vitals  Enc Vitals Group     BP 10/30/14 2030 137/85 mmHg     Pulse Rate 10/30/14 2030 117     Resp 10/30/14 2030 18     Temp 10/30/14 2030 99 F (37.2 C)     Temp Source 10/30/14 2030 Oral     SpO2 10/30/14 2030 100 %     Weight 10/30/14 2030 206 lb (93.441 kg)     Height 10/30/14 2030 5\' 9"  (1.753 m)     Head Cir --      Peak Flow --      Pain Score 10/30/14 2032 6     Pain Loc --      Pain Edu? --      Excl. in [place]? --     Constitutional: Alert and oriented. Well appearing and in no acute distress. Eyes: Conjunctivae are normal. PERRL. EOMI. Head: Atraumatic. Nose: No congestion/rhinnorhea. Mouth/Throat: Mucous membranes are moist.  Oropharynx non-erythematous. Neck: No stridor.  Cardiovascular: Normal rate, regular rhythm. Grossly normal heart sounds.  Good peripheral circulation. Respiratory: Normal respiratory effort.  No retractions. Lungs CTAB. Gastrointestinal: Firm mildly diffusely tender abdominal bowel sounds are decreased markedly Musculoskeletal: No lower extremity tenderness nor edema.  No joint effusions. Neurologic:  Normal speech and language. No gross focal neurologic deficits are appreciated. No gait instability. Skin:  Skin is warm, dry and intact. No rash noted. Psychiatric: Mood and affect are normal. Speech and behavior are normal.  ____________________________________________   LABS (all labs ordered are listed, but only abnormal results are displayed)  Labs Reviewed  LIPASE, BLOOD - Abnormal; Notable for the following:    Lipase 56 (*)    All other components within normal limits  COMPREHENSIVE METABOLIC PANEL - Abnormal; Notable for the  following:    Sodium 132 (*)    Chloride 94 (*)    Glucose, Bld 139 (*)    Total Protein 8.6 (*)    All other components within normal limits  CBC - Abnormal; Notable for the following:    WBC 11.7 (*)    All other components within normal limits  URINALYSIS COMPLETEWITH MICROSCOPIC ([facility] ONLY)   ____________________________________________  EKG   ____________________________________________  RADIOLOGY   ____________________________________________   PROCEDURES   ____________________________________________   INITIAL IMPRESSION / ASSESSMENT AND PLAN / ED COURSE  Pertinent labs & imaging results that were available during my care of the patient were reviewed by me and considered in my medical decision making (see chart for details).  Dr. Owens Shark will check the CT report ____________________________________________  FINAL CLINICAL IMPRESSION(S) / ED DIAGNOSES  Final diagnoses:  Intractable vomiting with nausea, vomiting of unspecified type      Nena Polio, MD 10/30/14 [phone]

## 2014-10-30 NOTE — ED Notes (Signed)
Hospitalist at bedside 

## 2014-10-30 NOTE — ED Notes (Signed)
MD at bedside. 

## 2014-10-30 NOTE — ED Notes (Signed)
Patient present to ED with complaint of nausea and vomiting since 3 pm today. Patient reports had "part of my colon removed on October 19, 2014" Patient reports feeling dizzy since yesterday when standing up. Patient denies known fevers, chest pain, diarrhea, shortness of breath, or blood in emesis. Staples noted on abdomen from colon resection. Patient alert and oriented, respirations even and unlabored, family at bedside, call bell within reach. NAD noted at this time. Will continue to monitor.

## 2014-10-30 NOTE — ED Provider Notes (Signed)
I assumed care of the patient 11:00 PM from Dr. Rip Harbour. CT scan of the abdomen pelvis revealed CT Abdomen Pelvis W Contrast (Final result) Result time: 10/30/14 23:05:37   Final result by Rad Results In Interface (10/30/14 23:05:37)   Narrative:   CLINICAL DATA: Abdominal pain nausea and vomiting, colon resection 11 days ago, low-grade fever  EXAM: CT ABDOMEN AND PELVIS WITH CONTRAST  TECHNIQUE: Multidetector CT imaging of the abdomen and pelvis was performed using the standard protocol following bolus administration of intravenous contrast.  CONTRAST: 123mL OMNIPAQUE IOHEXOL 300 MG/ML SOLN  COMPARISON: 09/20/2014  FINDINGS: Lower chest: Clear  Hepatobiliary: 5 mm cyst inferior right lobe stable.  Pancreas: Normal  Spleen: Normal  Adrenals/Urinary Tract: Normal  Stomach/Bowel: Stomach is normal. Small bowel is normal. There is a nonobstructive bowel gas pattern. There is evidence of postsurgical change involving the junction of the descending colon and the sigmoid colon with anastomosis. There is extensive diverticulosis involving the descending colon. In the area of anastomosis, there is moderate inflammatory change in the pericolonic fat. There is no evidence of free air, pneumatosis, or abscess.  Vascular/Lymphatic: Normal  Reproductive: Normal  Other: No ascites. Postsurgical change in the region of the right inguinal canal possibly from prior hernia repair, stable.  Musculoskeletal: No acute findings other than cutaneous postsurgical staples and midline periumbilical postoperative wound as anticipated.  IMPRESSION: Moderate inflammatory change at the anastomosis site possibly due to infection. No abscess identified.   Electronically Signed By: Skipper Cliche M.D. On: 10/30/2014 23:05   Patient discussed with Dr. Adonis Huguenin for admission. Ertapenem 1 g IV given in the emergency department  Gregor Hams, MD 10/31/14 0005

## 2014-10-30 NOTE — ED Notes (Signed)
Patient sipping on CT contrast, informed patient to notify this RN if patient begins to vomit or feel nauseous. Patient verbalized understanding.

## 2014-10-31 DIAGNOSIS — Z8249 Family history of ischemic heart disease and other diseases of the circulatory system: Secondary | ICD-10-CM | POA: Diagnosis not present

## 2014-10-31 DIAGNOSIS — Z79899 Other long term (current) drug therapy: Secondary | ICD-10-CM | POA: Diagnosis not present

## 2014-10-31 DIAGNOSIS — Z87442 Personal history of urinary calculi: Secondary | ICD-10-CM | POA: Diagnosis not present

## 2014-10-31 DIAGNOSIS — Z888 Allergy status to other drugs, medicaments and biological substances status: Secondary | ICD-10-CM | POA: Diagnosis not present

## 2014-10-31 DIAGNOSIS — Z79891 Long term (current) use of opiate analgesic: Secondary | ICD-10-CM | POA: Diagnosis not present

## 2014-10-31 DIAGNOSIS — G8918 Other acute postprocedural pain: Secondary | ICD-10-CM | POA: Diagnosis present

## 2014-10-31 DIAGNOSIS — Z800 Family history of malignant neoplasm of digestive organs: Secondary | ICD-10-CM | POA: Diagnosis not present

## 2014-10-31 DIAGNOSIS — K589 Irritable bowel syndrome without diarrhea: Secondary | ICD-10-CM | POA: Diagnosis present

## 2014-10-31 DIAGNOSIS — I10 Essential (primary) hypertension: Secondary | ICD-10-CM | POA: Diagnosis present

## 2014-10-31 DIAGNOSIS — Z801 Family history of malignant neoplasm of trachea, bronchus and lung: Secondary | ICD-10-CM | POA: Diagnosis not present

## 2014-10-31 DIAGNOSIS — R109 Unspecified abdominal pain: Secondary | ICD-10-CM | POA: Diagnosis present

## 2014-10-31 DIAGNOSIS — R1110 Vomiting, unspecified: Secondary | ICD-10-CM | POA: Diagnosis present

## 2014-10-31 DIAGNOSIS — M1990 Unspecified osteoarthritis, unspecified site: Secondary | ICD-10-CM | POA: Diagnosis present

## 2014-10-31 DIAGNOSIS — Z87891 Personal history of nicotine dependence: Secondary | ICD-10-CM | POA: Diagnosis not present

## 2014-10-31 DIAGNOSIS — Z820 Family history of epilepsy and other diseases of the nervous system: Secondary | ICD-10-CM | POA: Diagnosis not present

## 2014-10-31 DIAGNOSIS — Z9889 Other specified postprocedural states: Secondary | ICD-10-CM | POA: Diagnosis not present

## 2014-10-31 DIAGNOSIS — K5010 Crohn's disease of large intestine without complications: Secondary | ICD-10-CM | POA: Diagnosis present

## 2014-10-31 DIAGNOSIS — Z9049 Acquired absence of other specified parts of digestive tract: Secondary | ICD-10-CM | POA: Diagnosis present

## 2014-10-31 LAB — BASIC METABOLIC PANEL
Anion gap: 8 (ref 5–15)
BUN: 14 mg/dL (ref 6–20)
CO2: 26 mmol/L (ref 22–32)
CREATININE: 0.77 mg/dL (ref 0.61–1.24)
Calcium: 9.2 mg/dL (ref 8.9–10.3)
Chloride: 102 mmol/L (ref 101–111)
GFR calc non Af Amer: >60 mL/min (ref >60)
Glucose, Bld: 114 mg/dL — ABNORMAL HIGH (ref 65–99)
Potassium: 4.1 mmol/L (ref 3.5–5.1)
Sodium: 136 mmol/L (ref 135–145)

## 2014-10-31 LAB — PHOSPHORUS: PHOSPHORUS: 4.2 mg/dL (ref 2.5–4.6)

## 2014-10-31 LAB — CBC
HEMATOCRIT: 38.7 % — AB (ref 40.0–52.0)
HEMOGLOBIN: 13 g/dL (ref 13.0–18.0)
MCH: 28.3 pg (ref 26.0–34.0)
MCHC: 33.5 g/dL (ref 32.0–36.0)
MCV: 84.5 fL (ref 80.0–100.0)
Platelets: 344 10*3/uL (ref 150–440)
RBC: 4.58 MIL/uL (ref 4.40–5.90)
RDW: 13.7 % (ref 11.5–14.5)
WBC: 12.3 10*3/uL — ABNORMAL HIGH (ref 3.8–10.6)

## 2014-10-31 LAB — URINALYSIS COMPLETE WITH MICROSCOPIC (ARMC ONLY)
BACTERIA UA: NONE SEEN
Bilirubin Urine: NEGATIVE
GLUCOSE, UA: NEGATIVE mg/dL
HGB URINE DIPSTICK: NEGATIVE
Leukocytes, UA: NEGATIVE
NITRITE: NEGATIVE
PH: 7 (ref 5.0–8.0)
PROTEIN: NEGATIVE mg/dL
SQUAMOUS EPITHELIAL / LPF: NONE SEEN
Specific Gravity, Urine: 1.06 — ABNORMAL HIGH (ref 1.005–1.030)

## 2014-10-31 LAB — MAGNESIUM: Magnesium: 1.9 mg/dL (ref 1.7–2.4)

## 2014-10-31 MED ORDER — ONDANSETRON HCL 4 MG/2ML IJ SOLN
4.0000 mg | Freq: Four times a day (QID) | INTRAMUSCULAR | Status: DC | PRN
  Administered 2014-10-31 (×4): 4 mg via INTRAVENOUS
  Filled 2014-10-31 (×5): qty 2

## 2014-10-31 MED ORDER — PANTOPRAZOLE SODIUM 40 MG IV SOLR
40.0000 mg | Freq: Every day | INTRAVENOUS | Status: DC
  Administered 2014-10-31 – 2014-11-03 (×4): 40 mg via INTRAVENOUS
  Filled 2014-10-31 (×4): qty 40

## 2014-10-31 MED ORDER — LACTATED RINGERS IV SOLN
INTRAVENOUS | Status: DC
  Administered 2014-10-31 – 2014-11-04 (×13): via INTRAVENOUS

## 2014-10-31 MED ORDER — HYDROCHLOROTHIAZIDE 25 MG PO TABS
25.0000 mg | ORAL_TABLET | Freq: Every day | ORAL | Status: DC
  Administered 2014-11-01 – 2014-11-04 (×3): 25 mg via ORAL
  Filled 2014-10-31 (×4): qty 1

## 2014-10-31 MED ORDER — MORPHINE SULFATE 4 MG/ML IJ SOLN
4.0000 mg | INTRAMUSCULAR | Status: DC | PRN
  Administered 2014-10-31 – 2014-11-03 (×14): 4 mg via INTRAVENOUS
  Filled 2014-10-31 (×14): qty 1

## 2014-10-31 MED ORDER — SODIUM CHLORIDE 0.9 % IV SOLN
1.0000 g | INTRAVENOUS | Status: DC
  Administered 2014-11-01 – 2014-11-02 (×2): 1 g via INTRAVENOUS
  Filled 2014-10-31 (×4): qty 1

## 2014-10-31 MED ORDER — LOSARTAN POTASSIUM-HCTZ 100-25 MG PO TABS: 1.0000 | ORAL_TABLET | Freq: Every day | ORAL | Status: DC

## 2014-10-31 MED ORDER — DIPHENHYDRAMINE HCL 12.5 MG/5ML PO ELIX
12.5000 mg | ORAL_SOLUTION | Freq: Four times a day (QID) | ORAL | Status: DC | PRN
  Filled 2014-10-31: qty 10

## 2014-10-31 MED ORDER — LOSARTAN POTASSIUM 50 MG PO TABS
100.0000 mg | ORAL_TABLET | Freq: Every day | ORAL | Status: DC
  Administered 2014-11-01 – 2014-11-04 (×3): 100 mg via ORAL
  Filled 2014-10-31 (×4): qty 2

## 2014-10-31 MED ORDER — ENOXAPARIN SODIUM 40 MG/0.4ML ~~LOC~~ SOLN
40.0000 mg | SUBCUTANEOUS | Status: DC
  Administered 2014-10-31 – 2014-11-04 (×5): 40 mg via SUBCUTANEOUS
  Filled 2014-10-31 (×5): qty 0.4

## 2014-10-31 MED ORDER — DIPHENHYDRAMINE HCL 50 MG/ML IJ SOLN: 12.5000 mg | Freq: Four times a day (QID) | INTRAMUSCULAR | Status: DC | PRN

## 2014-10-31 NOTE — Progress Notes (Signed)
Initial Nutrition Assessment  DOCUMENTATION CODES:   Severe malnutrition in context of acute illness/injury  INTERVENTION:  Coordination of care: Await diet progression   NUTRITION DIAGNOSIS:   Inadequate oral intake related to altered GI function as evidenced by NPO status.    GOAL:   Patient will meet greater than or equal to 90% of their needs    MONITOR:    (Energy intake, Digestive system)  REASON FOR ASSESSMENT:   Malnutrition Screening Tool    ASSESSMENT:   Pt admitted with nausea, vomiting, questionable leak  Past Medical History  Diagnosis Date  . Diverticulitis   . Hypertension   . IBS (irritable bowel syndrome)   . Nephrolithiasis   . Arthritis     ankles  . Wears contact lenses     Current Nutrition: NPO  Food/Nutrition-Related History: pt has had decreased intake for the last few weeks, eating less than 50% of meals   Medications: protonix, LR at 175ml/hr  Electrolyte/Renal Profile and Glucose Profile:   Recent Labs Lab 10/30/14 2103 10/31/14 0305  NA 132* 136  K 4.1 4.1  CL 94* 102  CO2 25 26  BUN 14 14  CREATININE 1.05 0.77  CALCIUM 9.8 9.2  MG  --  1.9  PHOS  --  4.2  GLUCOSE 139* 114*   Protein Profile:  Recent Labs Lab 10/30/14 2103  ALBUMIN 4.6    Last BM:7/26 BM   Nutrition-Focused Physical Exam Findings:  Unable to complete Nutrition-Focused physical exam at this time.     Weight Change: 14% weight loss in the last 2 months per wt encounters    Diet Order:  Diet NPO time specified Except for: Ice Chips  Skin:   reviewed   Height:   Ht Readings from Last 1 Encounters:  10/30/14 5\' 9"  (1.753 m)    Weight:   Wt Readings from Last 1 Encounters:  10/31/14 201 lb 1.6 oz (91.218 kg)     BMI:  Body mass index is 29.68 kg/(m^2).  Estimated Nutritional Needs:   Kcal:  Using IBW of 73kg (BEE 1645 kcals (IF 1.0-1.2, AF 1.3) 2138-2566 kcals/d  Protein:  (1.2-1.5 g/d) 88-110 g/d  Fluid:   (30-41ml/kg) 2190 -2574ml/d  EDUCATION NEEDS:   No education needs identified at this time  [place]. Zenia Resides, [place], [place] (pager)

## 2014-10-31 NOTE — H&P (Signed)
[name], MD Physician Signed Surgery Progress Notes 10/30/2014 11:51 PM    Expand All Collapse All   Patient ID: Edward Harris, male DOB: 05/06/1975, 39 y.o. MRN: [id number]  History of Present Illness Edward Harris is a 39 y.o. male with a 2 day history of progressive worsening nausea and a one day history of emesis. He reports that he threw up 3 times today that was followed by abdominal pain near his surgical site. He is 11 days post sigmoid colectomy with side to side anastamosis that was performed for recurrent diverticulitis. He reports that he was discharged home from that surgery 5 days ago. He has been having bowel function with his last BM being this morning. The last PO that he was able to keep down was over 12 hours prior to this history. He denies any fevers or chills but has just not been "feeling right". He states that this feeling has been different than his prior diverticulitis attacks. Patient reports that he still has his staples in place from his surgery as well.  Past Medical History Past Medical History  Diagnosis Date  . Diverticulitis   . Hypertension   . IBS (irritable bowel syndrome)   . Nephrolithiasis   . Arthritis     ankles  . Wears contact lenses      Past Surgical History  Procedure Laterality Date  . Inguinal hernia repair Right   . Kidney stone surgery      extraction/lithotripsy  . Esophagogastroduodenoscopy (egd) with propofol N/A 10/03/2014    Procedure: ESOPHAGOGASTRODUODENOSCOPY (EGD) WITH PROPOFOL; Surgeon: Lucilla Lame, MD; Location: [place]; Service: Endoscopy; Laterality: N/A;  . Colonoscopy with propofol N/A 10/03/2014    Procedure: COLONOSCOPY WITH PROPOFOL; Surgeon: Lucilla Lame, MD; Location: [place]; Service: Endoscopy; Laterality: N/A;  . Laparoscopic sigmoid colectomy N/A 10/19/2014    Procedure: LAPAROSCOPIC ASSISTED SIGMOID  COLECTOMY; Surgeon: Marlyce Huge, MD; Location: [facility]; Service: General; Laterality: N/A;  . Colon resection  10/30/2014    Allergies  Allergen Reactions  . Amlodipine Other (See Comments)    Sore and numbness in arms.    Current Facility-Administered Medications  Medication Dose Route Frequency Provider Last Rate Last Dose  . ertapenem (INVANZ) 1 g in sodium chloride 0.9 % 50 mL IVPB 1 g Intravenous Once Gregor Hams, MD     Current Outpatient Prescriptions  Medication Sig Dispense Refill  . losartan-hydrochlorothiazide (HYZAAR) 100-25 MG per tablet Take 1 tablet by mouth daily.  3  . metoCLOPramide (REGLAN) 10 MG tablet Take 1 tablet (10 mg total) by mouth every 8 (eight) hours as needed for nausea or vomiting. (Patient taking differently: Take 10 mg by mouth 3 (three) times daily. ) 30 tablet 1  . oxyCODONE-acetaminophen (ROXICET) 5-325 MG per tablet Take 1 tablet by mouth every 4 (four) hours as needed for moderate pain. 30 tablet 0  . pantoprazole (PROTONIX) 40 MG tablet Take 1 tablet (40 mg total) by mouth daily. 31 tablet 2  . sucralfate (CARAFATE) 1 G tablet Take 1 g by mouth 4 (four) times daily.  0    Family History Family History  Problem Relation Age of Onset  . Hypertension Mother   . Osteoarthritis Mother   . Heart attack Father   . Hypertension Brother   . Alzheimer's disease Maternal Aunt   . Lung cancer    . Colon cancer Maternal Grandmother 75      Social History History  Substance Use Topics  .  Smoking status: Former Research scientist (life sciences)  . Smokeless tobacco: Never Used  . Alcohol Use: 0.0 oz/week    0 Standard drinks or equivalent per week     Comment: "occassional" couple drinks per month    Quit smoking 4 years ago   ROS   Physical Exam Blood pressure 145/87, pulse 102, temperature 99 F (37.2 C), temperature source  Oral, resp. rate 18, height 5\' 9"  (1.753 m), weight 93.441 kg (206 lb), SpO2 100 %.  CONSTITUTIONAL: awake, alert and appropriate EYES: Pupils equal, round, and reactive to light, Sclera non-icteric. EARS, NOSE, MOUTH AND THROAT: The oropharynx is clear. Oral mucosa is pink and moist. Hearing is intact to voice.  NECK: Trachea is midline, and there is no jugular venous distension. Thyroid is without palpable abnormalities. LYMPH NODES: Lymph nodes in the neck are not enlarged. RESPIRATORY: Lungs are clear, and breath sounds are equal bilaterally. Normal respiratory effort without pathologic use of accessory muscles. CARDIOVASCULAR: Heart is regular without murmurs, gallops, or rubs. GI: The abdomen is soft, and nondistended but with tenderness to palpation along his midline incision. Staples are in place to midline and left lower abdomen without any evidence of drainage or inflammation There were no palpable masses. There was no hepatosplenomegaly. There were normal bowel sounds. GU: Deffered MUSCULOSKELETAL: Normal muscle strength and tone in all four extremities.  SKIN: Skin turgor is normal. There are no pathologic skin lesions.  NEUROLOGIC: Motor and sensation is grossly normal. Cranial nerves are grossly intact. PSYCH: Alert and oriented to person, place and time. Affect is normal.  Data Reviewed CT scan reviewed with evidence of inflammation at his anastamosis without any abscess. Pan diverticulosis Labs reviewed with WBC of 11.7  I have personally reviewed the patient's imaging and medical records.   Assessment    39 year old male with regional colitis without abscess at site of recent surgical resection with anastomosis. This is either local recurrent diverticulitis or represents a small anastomotic leak  Plan    Will admit to Med-Surg IV ABX - will use invanz Keep NPO Will be able to remove staples as inpatient  Face-to-face time spent with the patient and care  providers was 30 minutes, with more than 50% of the time spent counseling, educating, and coordinating care of the patient.    [name] 10/30/2014, 11:51 PM

## 2014-10-31 NOTE — Progress Notes (Signed)
Surgery Progress Note  S: Pain and nausea improved O: Blood pressure 150/89, pulse 92, temperature 98.2 F (36.8 C), temperature source Oral, resp. rate 17, height 5\' 9"  (1.753 m), weight 91.218 kg (201 lb 1.6 oz), SpO2 98 %. GEN: NAD/A&Ox3 ABD: soft, nontender, nondistended  A/P 39 yo s/p sigmoid colectomy, now with nausea/vomiting, mild pain, inflammation around anastamosis - NPO - invanz

## 2014-11-01 ENCOUNTER — Telehealth: Payer: Self-pay | Admitting: Surgery

## 2014-11-01 NOTE — Progress Notes (Signed)
Surgery Progress Note  S: Pain improved.  No nausea O: Blood pressure 124/67, pulse 60, temperature 98.3 F (36.8 C), temperature source Oral, resp. rate 16, height 5\' 9"  (1.753 m), weight 91.218 kg (201 lb 1.6 oz), SpO2 97 %. GEN: NAD/A&Ox3 ABD: soft, mild tender, nondistended  A/P 39 yo admit with ? Colitis, doing well - cont abx - liquid diet

## 2014-11-01 NOTE — Telephone Encounter (Signed)
Pt's wife has called and is requesting a letter for his absence from work due to surgery. She would like to pick this up after lunch today.

## 2014-11-02 MED ORDER — DIAZEPAM 2 MG PO TABS: 2.0000 mg | ORAL_TABLET | Freq: Three times a day (TID) | ORAL | Status: DC | PRN

## 2014-11-02 MED ORDER — POLYETHYLENE GLYCOL 3350 17 G PO PACK
17.0000 g | PACK | Freq: Every day | ORAL | Status: DC
  Administered 2014-11-02: 17 g via ORAL
  Filled 2014-11-02: qty 1

## 2014-11-02 MED ORDER — OXYCODONE-ACETAMINOPHEN 5-325 MG PO TABS
1.0000 | ORAL_TABLET | Freq: Four times a day (QID) | ORAL | Status: DC | PRN
  Administered 2014-11-03 – 2014-11-04 (×3): 2 via ORAL
  Filled 2014-11-02 (×3): qty 2

## 2014-11-02 NOTE — Progress Notes (Signed)
Surgery Progress Note  S: No acute issues.  Pain improved.  No nausea O: Blood pressure 108/66, pulse 57, temperature 98.2 F (36.8 C), temperature source Oral, resp. rate 20, height 5\' 9"  (1.753 m), weight 91.218 kg (201 lb 1.6 oz), SpO2 100 %. GEN: NAD/A&Ox3 ABD: soft, min tender, nondistended, incision c/d/i  A/P 39 yo s/p sigmoid colectomy, readmit for perianastamotic inflammation - IV abx until tolerating diet - PO pain meds - regular diet

## 2014-11-02 NOTE — Care Management (Signed)
Admitted to [facility] with the diagnosis of Colitis. Discharged from this facility 5 days ago following a sigmoid colectomy. Lives with wife, [name] [phone]) Uses no aids for ambulation. Takes care of all activities of daily living himself. Family will transport. Shelbie Ammons RN MSN Care Management [phone]

## 2014-11-02 NOTE — Progress Notes (Signed)
Nutrition Follow-up  DOCUMENTATION CODES:   Severe malnutrition in context of acute illness/injury  INTERVENTION:   Meals and Snacks: Cater to patient preferences; pt may benefit from Soft Diet Order. Pt reports choosing bland food items at this time from the Regular menu. Medical Food Supplement Therapy: will send Sugar Free Carnation Instant Breakfast on breakfast trays for added nutrition Education: pt reports following low fiber diet education since last admission, and recalls education from previous RD. No further questions on visit today.   NUTRITION DIAGNOSIS:   Inadequate oral intake related to altered GI function as evidenced by NPO status; improved with diet advancement  GOAL:   Patient will meet greater than or equal to 90% of their needs; ongoing  MONITOR:    (Energy intake, Digestive system)  REASON FOR ASSESSMENT:   Malnutrition Screening Tool    ASSESSMENT:   Pt s/p sigmoid colectomy 2 weeks PTA and readmitted with perianastamotic inflammation. Pt reports abdomen tenderness on visit. Diet order just advanced prior to lunch.  Diet Order:  Diet regular Room service appropriate?: Yes; Fluid consistency:: Thin    Current Nutrition: Pt ate 100% of honey glazed chicken and potatoes with most of chocolate pudding. Pt reports tolerating very well thus far. Pt was eating 100% of CL yesterday.   Gastrointestinal Profile: +BS, +Flatus, no BM yet Last BM: 10/30/2014   Medications: LR at 162mL/hr, Miralax, Protonix  Electrolyte/Renal Profile and Glucose Profile:   Recent Labs Lab 10/30/14 2103 10/31/14 0305  NA 132* 136  K 4.1 4.1  CL 94* 102  CO2 25 26  BUN 14 14  CREATININE 1.05 0.77  CALCIUM 9.8 9.2  MG  --  1.9  PHOS  --  4.2  GLUCOSE 139* 114*   Protein Profile:  Recent Labs Lab 10/30/14 2103  ALBUMIN 4.6     Weight Trend since Admission: Filed Weights   10/30/14 2030 10/31/14 0223  Weight: 206 lb (93.441 kg) 201 lb 1.6 oz (91.218 kg)     Ideal Body Weight:   73kg  BMI:  Body mass index is 29.68 kg/(m^2).  Estimated Nutritional Needs:   Kcal:  Using IBW of 73kg (BEE 1645 kcals (IF 1.0-1.2, AF 1.3) 2138-2566 kcals/d  Protein:  (1.2-1.5 g/d) 88-110 g/d  Fluid:  (30-12ml/kg) 2190 -2537ml/d  EDUCATION NEEDS:   No education needs identified at this time    [place] Grant, RD, LDN Pager [phone]

## 2014-11-02 NOTE — Progress Notes (Addendum)
Pt ambulated in hallway  around nursing station four times. Tolerated well. Wife at bedside for support. IVF's infusing without difficulty. Pt with c/o of some low abdomen pain rated 8/10. Morphine IV given per M.D. Order (see mar). Pt with relief noted. Abdomen surgical site with staples C/D/I. Noted active bowel sounds and flatus.  Pt tolerating clear diet at  This time. Will continue to monitor per unit protocol and M.D. Orders.

## 2014-11-03 LAB — BASIC METABOLIC PANEL
Anion gap: 6 (ref 5–15)
BUN: 7 mg/dL (ref 6–20)
CALCIUM: 9.2 mg/dL (ref 8.9–10.3)
CO2: 29 mmol/L (ref 22–32)
Chloride: 103 mmol/L (ref 101–111)
Creatinine, Ser: 0.73 mg/dL (ref 0.61–1.24)
GFR calc Af Amer: >60 mL/min (ref >60)
GFR calc non Af Amer: >60 mL/min (ref >60)
GLUCOSE: 93 mg/dL (ref 65–99)
Potassium: 3.7 mmol/L (ref 3.5–5.1)
SODIUM: 138 mmol/L (ref 135–145)

## 2014-11-03 LAB — CBC WITH DIFFERENTIAL/PLATELET
Basophils Absolute: 0.1 10*3/uL (ref 0–0.1)
Basophils Relative: 2 %
EOS ABS: 0.2 10*3/uL (ref 0–0.7)
Eosinophils Relative: 4 %
HCT: 36.4 % — ABNORMAL LOW (ref 40.0–52.0)
Hemoglobin: 12.1 g/dL — ABNORMAL LOW (ref 13.0–18.0)
Lymphocytes Relative: 35 %
Lymphs Abs: 2.1 10*3/uL (ref 1.0–3.6)
MCH: 28.3 pg (ref 26.0–34.0)
MCHC: 33.2 g/dL (ref 32.0–36.0)
MCV: 85.3 fL (ref 80.0–100.0)
MONO ABS: 0.6 10*3/uL (ref 0.2–1.0)
Monocytes Relative: 9 %
Neutro Abs: 3 10*3/uL (ref 1.4–6.5)
Neutrophils Relative %: 50 %
PLATELETS: 312 10*3/uL (ref 150–440)
RBC: 4.27 MIL/uL — AB (ref 4.40–5.90)
RDW: 13.4 % (ref 11.5–14.5)
WBC: 6.1 10*3/uL (ref 3.8–10.6)

## 2014-11-03 MED ORDER — KETOROLAC TROMETHAMINE 30 MG/ML IJ SOLN
30.0000 mg | Freq: Three times a day (TID) | INTRAMUSCULAR | Status: DC
Start: 2014-11-03 — End: 2014-11-04
  Administered 2014-11-03 – 2014-11-04 (×3): 30 mg via INTRAVENOUS
  Filled 2014-11-03 (×3): qty 1

## 2014-11-03 MED ORDER — AMOXICILLIN-POT CLAVULANATE 875-125 MG PO TABS
1.0000 | ORAL_TABLET | Freq: Two times a day (BID) | ORAL | Status: DC
  Administered 2014-11-03 – 2014-11-04 (×3): 1 via ORAL
  Filled 2014-11-03 (×3): qty 1

## 2014-11-03 NOTE — Progress Notes (Signed)
Surgery Progress Note  S: No acute issues, + BM yesterday O: Blood pressure 129/68, pulse 64, temperature 98.3 F (36.8 C), temperature source Oral, resp. rate 16, height 5\' 9"  (1.753 m), weight 91.218 kg (201 lb 1.6 oz), SpO2 100 %. GEN: NAD/A&Ox3 ABD: soft, NT, ND  WBC 6.1  A/P 39 yo s/p sigmoid colectomy, return with perianastamotic inflammation, improved - d/c invanz - PO augmentin - possibly home soon

## 2014-11-03 NOTE — Discharge Summary (Signed)
Discharge diagnosis: Recurrent diverticulitis Procedure performed: Laparoscopic assisted sigmoid colectomy    Medication List    STOP taking these medications        bisacodyl 5 MG EC tablet  Commonly known as:  bisacodyl     metoCLOPramide 10 MG tablet  Commonly known as:  REGLAN     polyethylene glycol powder powder  Commonly known as:  GLYCOLAX/MIRALAX     ranitidine 150 MG capsule  Commonly known as:  ZANTAC     sucralfate 1 G tablet  Commonly known as:  CARAFATE     SUPREP BOWEL PREP Soln  Generic drug:  Na Sulfate-K Sulfate-Mg Sulf      TAKE these medications        losartan-hydrochlorothiazide 100-25 MG per tablet  Commonly known as:  HYZAAR  Take 1 tablet by mouth daily.     oxyCODONE-acetaminophen 5-325 MG per tablet  Commonly known as:  ROXICET  Take 1 tablet by mouth every 4 (four) hours as needed for moderate pain.     pantoprazole 40 MG tablet  Commonly known as:  PROTONIX  Take 1 tablet (40 mg total) by mouth daily.       Hospital Course: Patient was admitted following unremarkable sigmoid colectomy. Postoperatively he was began on clear liquid diet and IV pain medication.  As he began having return of bowel function, his diet was advanced to clear liquid and eventually regular and his IV pain medication was converted to oral.  At time of discharge, Edward Harris was tolerating a regular diet with good oral pain control.

## 2014-11-04 MED ORDER — AMOXICILLIN-POT CLAVULANATE 875-125 MG PO TABS: 1.0000 | ORAL_TABLET | Freq: Two times a day (BID) | ORAL | Status: DC

## 2014-11-04 MED ORDER — PANTOPRAZOLE SODIUM 40 MG PO TBEC: 40.0000 mg | DELAYED_RELEASE_TABLET | Freq: Every day | ORAL | Status: DC

## 2014-11-04 MED ORDER — POLYETHYLENE GLYCOL 3350 17 G PO PACK: 17.0000 g | PACK | Freq: Every day | ORAL | Status: DC

## 2014-11-04 MED ORDER — IBUPROFEN 600 MG PO TABS
600.0000 mg | ORAL_TABLET | Freq: Three times a day (TID) | ORAL | Status: DC
Start: 2014-11-04 — End: 2015-02-01

## 2014-11-04 MED ORDER — OXYCODONE-ACETAMINOPHEN 5-325 MG PO TABS: 1.0000 | ORAL_TABLET | ORAL | Status: DC | PRN

## 2014-11-04 NOTE — Discharge Instructions (Signed)
Do not drive on pain medications Do not lift greater than 15 lbs for a period of 6 weeks Call or return to ER if you develop fever greater than 101.5, nausea/vomiting, increased pain, redness/drainage from incisions

## 2014-11-04 NOTE — Progress Notes (Signed)
Surgery Progress Note  S: Pain improved, nausea improved O: Blood pressure 112/69, pulse 62, temperature 97.7 F (36.5 C), temperature source Oral, resp. rate 17, height 5\' 9"  (1.753 m), weight 91.218 kg (201 lb 1.6 oz), SpO2 100 %. GEN: NAD/A&Ox3 ABD: soft, min tender, nondistended, incision c/d/i  A/P 39 yo s/p sigmoid colectomy, readmit with perianastamotic inflammation - regular diet - add ibuprofen - possibly home later

## 2014-11-04 NOTE — Discharge Summary (Signed)
Discharge Diagnosis: Perianastamotic inflammation following sigmoid colectomy Procedure Performed: None    Medication List    TAKE these medications        amoxicillin-clavulanate 875-125 MG per tablet  Commonly known as:  AUGMENTIN  Take 1 tablet by mouth every 12 (twelve) hours.     ibuprofen 600 MG tablet  Commonly known as:  ADVIL,MOTRIN  Take 1 tablet (600 mg total) by mouth every 8 (eight) hours.     losartan-hydrochlorothiazide 100-25 MG per tablet  Commonly known as:  HYZAAR  Take 1 tablet by mouth daily.     ondansetron 4 MG tablet  Commonly known as:  ZOFRAN  Take 4 mg by mouth every 8 (eight) hours as needed for nausea or vomiting.     oxyCODONE-acetaminophen 5-325 MG per tablet  Commonly known as:  ROXICET  Take 1-2 tablets by mouth every 4 (four) hours as needed for moderate pain.     pantoprazole 40 MG tablet  Commonly known as:  PROTONIX  Take 1 tablet (40 mg total) by mouth daily.     polyethylene glycol packet  Commonly known as:  MIRALAX / GLYCOLAX  Take 17 g by mouth daily.       Hospital Course: Edward Harris presented with abdominal pain and nausea.  CT scan showed inflammation around the anastmosis from his prior colectomy.  He was made NPO and began on IV antibiotics.  He was also given IV pain meds for pain control.  As his pain improved, his diet was increased to clear liquids and eventually regular diet.  His antibiotics and pain meds were converted to oral forms.  At time of discharge, Edward Harris was tolerating a regular diet with bowel movements and minimal pain.

## 2014-11-04 NOTE — Progress Notes (Signed)
Pt's discharge instructions were reviewed with the pt along with prescriptions to pick from the pharmacy. The pt has a follow-up appointment scheduled 11/08/2014 with [facility] surgical. IV was removed and pt is awaiting family to arrive to transport back home.

## 2014-11-05 LAB — CULTURE, BLOOD (ROUTINE X 2)
Culture: NO GROWTH
Culture: NO GROWTH

## 2014-11-08 ENCOUNTER — Encounter: Payer: Self-pay | Admitting: Surgery

## 2014-11-08 ENCOUNTER — Ambulatory Visit (INDEPENDENT_AMBULATORY_CARE_PROVIDER_SITE_OTHER): Payer: Managed Care, Other (non HMO) | Admitting: Surgery

## 2014-11-08 VITALS — BP 132/84 | HR 74 | Temp 98.4°F | Ht 69.0 in | Wt 204.0 lb

## 2014-11-08 DIAGNOSIS — Z09 Encounter for follow-up examination after completed treatment for conditions other than malignant neoplasm: Secondary | ICD-10-CM

## 2014-11-08 NOTE — Progress Notes (Signed)
Surgery Clinic Note  S: Some nausea.  No pain.  Not quite having regular BM yet but did have one day before yesterday. O:Blood pressure 132/84, pulse 74, temperature 98.4 F (36.9 C), temperature source Oral, height 5\' 9"  (1.753 m), weight 92.534 kg (204 lb). GEN: NAD/A&Ox3 ABD: soft, min tender, nondistended, incision c/d/i  A/P 39 yo s/p sigmoid colectomy for diverticulitis, doing well - miralax - f/u 2 weeks to ensure nausea improving, reevaluate for possible return to work after 6 weeks

## 2014-11-08 NOTE — Patient Instructions (Signed)
Please go back taking Miralax daily.  I will see you in two weeks.

## 2014-11-14 ENCOUNTER — Telehealth: Payer: Self-pay | Admitting: Surgery

## 2014-11-14 ENCOUNTER — Encounter: Payer: Self-pay | Admitting: *Deleted

## 2014-11-14 ENCOUNTER — Emergency Department: Payer: Managed Care, Other (non HMO)

## 2014-11-14 ENCOUNTER — Observation Stay
Admission: EM | Admit: 2014-11-14 | Discharge: 2014-11-20 | Disposition: A | Discharge status: Home / Self Care | Payer: Managed Care, Other (non HMO) | Attending: Surgery | Admitting: Surgery

## 2014-11-14 DIAGNOSIS — I10 Essential (primary) hypertension: Secondary | ICD-10-CM | POA: Insufficient documentation

## 2014-11-14 DIAGNOSIS — Z9049 Acquired absence of other specified parts of digestive tract: Secondary | ICD-10-CM | POA: Insufficient documentation

## 2014-11-14 DIAGNOSIS — Z8249 Family history of ischemic heart disease and other diseases of the circulatory system: Secondary | ICD-10-CM | POA: Insufficient documentation

## 2014-11-14 DIAGNOSIS — K589 Irritable bowel syndrome without diarrhea: Secondary | ICD-10-CM | POA: Insufficient documentation

## 2014-11-14 DIAGNOSIS — K567 Ileus, unspecified: Secondary | ICD-10-CM | POA: Diagnosis not present

## 2014-11-14 DIAGNOSIS — Z888 Allergy status to other drugs, medicaments and biological substances status: Secondary | ICD-10-CM | POA: Diagnosis not present

## 2014-11-14 DIAGNOSIS — R109 Unspecified abdominal pain: Secondary | ICD-10-CM | POA: Diagnosis present

## 2014-11-14 DIAGNOSIS — D125 Benign neoplasm of sigmoid colon: Secondary | ICD-10-CM | POA: Insufficient documentation

## 2014-11-14 DIAGNOSIS — R110 Nausea: Secondary | ICD-10-CM | POA: Insufficient documentation

## 2014-11-14 DIAGNOSIS — K2970 Gastritis, unspecified, without bleeding: Secondary | ICD-10-CM | POA: Diagnosis not present

## 2014-11-14 DIAGNOSIS — R112 Nausea with vomiting, unspecified: Secondary | ICD-10-CM | POA: Diagnosis not present

## 2014-11-14 DIAGNOSIS — Z87442 Personal history of urinary calculi: Secondary | ICD-10-CM | POA: Diagnosis not present

## 2014-11-14 DIAGNOSIS — G43A0 Cyclical vomiting, not intractable: Secondary | ICD-10-CM | POA: Insufficient documentation

## 2014-11-14 DIAGNOSIS — K5732 Diverticulitis of large intestine without perforation or abscess without bleeding: Secondary | ICD-10-CM | POA: Insufficient documentation

## 2014-11-14 DIAGNOSIS — Z87891 Personal history of nicotine dependence: Secondary | ICD-10-CM | POA: Insufficient documentation

## 2014-11-14 DIAGNOSIS — K529 Noninfective gastroenteritis and colitis, unspecified: Principal | ICD-10-CM | POA: Insufficient documentation

## 2014-11-14 DIAGNOSIS — Z8489 Family history of other specified conditions: Secondary | ICD-10-CM | POA: Insufficient documentation

## 2014-11-14 DIAGNOSIS — R1084 Generalized abdominal pain: Secondary | ICD-10-CM

## 2014-11-14 DIAGNOSIS — Z79899 Other long term (current) drug therapy: Secondary | ICD-10-CM | POA: Insufficient documentation

## 2014-11-14 DIAGNOSIS — K4090 Unilateral inguinal hernia, without obstruction or gangrene, not specified as recurrent: Secondary | ICD-10-CM | POA: Diagnosis not present

## 2014-11-14 DIAGNOSIS — Z801 Family history of malignant neoplasm of trachea, bronchus and lung: Secondary | ICD-10-CM | POA: Diagnosis not present

## 2014-11-14 DIAGNOSIS — Z800 Family history of malignant neoplasm of digestive organs: Secondary | ICD-10-CM | POA: Diagnosis not present

## 2014-11-14 LAB — URINALYSIS COMPLETE WITH MICROSCOPIC (ARMC ONLY)
BACTERIA UA: NONE SEEN
Bilirubin Urine: NEGATIVE
GLUCOSE, UA: NEGATIVE mg/dL
Hgb urine dipstick: NEGATIVE
Leukocytes, UA: NEGATIVE
Nitrite: NEGATIVE
PROTEIN: NEGATIVE mg/dL
SPECIFIC GRAVITY, URINE: 1.054 — AB (ref 1.005–1.030)
Squamous Epithelial / LPF: NONE SEEN
pH: 7 (ref 5.0–8.0)

## 2014-11-14 LAB — CBC WITH DIFFERENTIAL/PLATELET
BASOS PCT: 1 %
Basophils Absolute: 0.1 10*3/uL (ref 0–0.1)
Eosinophils Absolute: 0 10*3/uL (ref 0–0.7)
Eosinophils Relative: 0 %
HCT: 43.6 % (ref 40.0–52.0)
HEMOGLOBIN: 14.6 g/dL (ref 13.0–18.0)
LYMPHS ABS: 1.2 10*3/uL (ref 1.0–3.6)
Lymphocytes Relative: 11 %
MCH: 28.1 pg (ref 26.0–34.0)
MCHC: 33.4 g/dL (ref 32.0–36.0)
MCV: 84.3 fL (ref 80.0–100.0)
MONO ABS: 0.4 10*3/uL (ref 0.2–1.0)
MONOS PCT: 3 %
NEUTROS ABS: 8.8 10*3/uL — AB (ref 1.4–6.5)
NEUTROS PCT: 85 %
Platelets: 365 10*3/uL (ref 150–440)
RBC: 5.18 MIL/uL (ref 4.40–5.90)
RDW: 13.4 % (ref 11.5–14.5)
WBC: 10.4 10*3/uL (ref 3.8–10.6)

## 2014-11-14 LAB — COMPREHENSIVE METABOLIC PANEL
ALK PHOS: 92 U/L (ref 38–126)
ALT: 58 U/L (ref 17–63)
AST: 28 U/L (ref 15–41)
Albumin: 5 g/dL (ref 3.5–5.0)
Anion gap: 13 (ref 5–15)
BUN: 11 mg/dL (ref 6–20)
CO2: 27 mmol/L (ref 22–32)
Calcium: 9.9 mg/dL (ref 8.9–10.3)
Chloride: 95 mmol/L — ABNORMAL LOW (ref 101–111)
Creatinine, Ser: 0.92 mg/dL (ref 0.61–1.24)
GFR calc non Af Amer: >60 mL/min (ref >60)
Glucose, Bld: 134 mg/dL — ABNORMAL HIGH (ref 65–99)
POTASSIUM: 4.1 mmol/L (ref 3.5–5.1)
SODIUM: 135 mmol/L (ref 135–145)
TOTAL PROTEIN: 8.8 g/dL — AB (ref 6.5–8.1)
Total Bilirubin: 1.5 mg/dL — ABNORMAL HIGH (ref 0.3–1.2)

## 2014-11-14 LAB — LIPASE, BLOOD: Lipase: 42 U/L (ref 22–51)

## 2014-11-14 MED ORDER — ACETAMINOPHEN 325 MG PO TABS
650.0000 mg | ORAL_TABLET | Freq: Four times a day (QID) | ORAL | Status: DC | PRN
Start: 2014-11-14 — End: 2014-11-20

## 2014-11-14 MED ORDER — CIPROFLOXACIN IN D5W 400 MG/200ML IV SOLN
INTRAVENOUS | Status: AC
  Administered 2014-11-14: 400 mg via INTRAVENOUS
  Filled 2014-11-14: qty 200

## 2014-11-14 MED ORDER — GI COCKTAIL ~~LOC~~
30.0000 mL | ORAL | Status: AC
  Administered 2014-11-14: 30 mL via ORAL
  Filled 2014-11-14: qty 30

## 2014-11-14 MED ORDER — ONDANSETRON 4 MG PO TBDP: 4.0000 mg | ORAL_TABLET | Freq: Four times a day (QID) | ORAL | Status: DC | PRN

## 2014-11-14 MED ORDER — PROMETHAZINE HCL 25 MG/ML IJ SOLN
25.0000 mg | Freq: Once | INTRAMUSCULAR | Status: AC
  Administered 2014-11-14: 25 mg via INTRAVENOUS
  Filled 2014-11-14: qty 1

## 2014-11-14 MED ORDER — PANTOPRAZOLE SODIUM 40 MG IV SOLR
40.0000 mg | Freq: Every day | INTRAVENOUS | Status: DC
  Administered 2014-11-15 – 2014-11-16 (×2): 40 mg via INTRAVENOUS
  Filled 2014-11-14 (×2): qty 40

## 2014-11-14 MED ORDER — HYDROMORPHONE HCL 1 MG/ML IJ SOLN
1.0000 mg | Freq: Once | INTRAMUSCULAR | Status: AC
  Administered 2014-11-14: 1 mg via INTRAVENOUS
  Filled 2014-11-14: qty 1

## 2014-11-14 MED ORDER — HEPARIN SODIUM (PORCINE) 5000 UNIT/ML IJ SOLN
5000.0000 [IU] | Freq: Three times a day (TID) | INTRAMUSCULAR | Status: DC
  Administered 2014-11-15 – 2014-11-20 (×16): 5000 [IU] via SUBCUTANEOUS
  Filled 2014-11-14 (×16): qty 1

## 2014-11-14 MED ORDER — ONDANSETRON HCL 4 MG/2ML IJ SOLN
4.0000 mg | Freq: Once | INTRAMUSCULAR | Status: AC
  Administered 2014-11-14: 4 mg via INTRAVENOUS
  Filled 2014-11-14: qty 2

## 2014-11-14 MED ORDER — IOHEXOL 240 MG/ML SOLN: 25.0000 mL | Freq: Once | INTRAMUSCULAR | Status: DC | PRN

## 2014-11-14 MED ORDER — HYDROMORPHONE HCL 1 MG/ML IJ SOLN
0.5000 mg | INTRAMUSCULAR | Status: DC | PRN
  Administered 2014-11-15 – 2014-11-19 (×18): 1 mg via INTRAVENOUS
  Filled 2014-11-14 (×18): qty 1

## 2014-11-14 MED ORDER — IOHEXOL 240 MG/ML SOLN
50.0000 mL | Freq: Once | INTRAMUSCULAR | Status: AC | PRN
Start: 2014-11-14 — End: 2014-11-14
  Administered 2014-11-14: 50 mL via ORAL

## 2014-11-14 MED ORDER — METRONIDAZOLE IN NACL 5-0.79 MG/ML-% IV SOLN
500.0000 mg | Freq: Three times a day (TID) | INTRAVENOUS | Status: DC
  Administered 2014-11-15 – 2014-11-18 (×10): 500 mg via INTRAVENOUS
  Filled 2014-11-14 (×16): qty 100

## 2014-11-14 MED ORDER — DIPHENHYDRAMINE HCL 50 MG/ML IJ SOLN
25.0000 mg | Freq: Once | INTRAMUSCULAR | Status: AC
  Administered 2014-11-14: 25 mg via INTRAVENOUS
  Filled 2014-11-14: qty 1

## 2014-11-14 MED ORDER — KETOROLAC TROMETHAMINE 30 MG/ML IJ SOLN
30.0000 mg | Freq: Three times a day (TID) | INTRAMUSCULAR | Status: AC
  Administered 2014-11-14 – 2014-11-19 (×15): 30 mg via INTRAVENOUS
  Filled 2014-11-14 (×15): qty 1

## 2014-11-14 MED ORDER — CIPROFLOXACIN IN D5W 400 MG/200ML IV SOLN
400.0000 mg | Freq: Two times a day (BID) | INTRAVENOUS | Status: DC
  Administered 2014-11-14 – 2014-11-18 (×8): 400 mg via INTRAVENOUS
  Filled 2014-11-14 (×9): qty 200

## 2014-11-14 MED ORDER — SODIUM CHLORIDE 0.9 % IV SOLN
INTRAVENOUS | Status: DC
  Administered 2014-11-14 – 2014-11-18 (×7): via INTRAVENOUS

## 2014-11-14 MED ORDER — ONDANSETRON HCL 4 MG/2ML IJ SOLN
4.0000 mg | Freq: Four times a day (QID) | INTRAMUSCULAR | Status: DC | PRN
  Administered 2014-11-14 – 2014-11-18 (×5): 4 mg via INTRAVENOUS
  Filled 2014-11-14 (×5): qty 2

## 2014-11-14 MED ORDER — SODIUM CHLORIDE 0.9 % IV BOLUS (SEPSIS)
1000.0000 mL | Freq: Once | INTRAVENOUS | Status: AC
  Administered 2014-11-14: 1000 mL via INTRAVENOUS

## 2014-11-14 MED ORDER — ACETAMINOPHEN 650 MG RE SUPP: 650.0000 mg | Freq: Four times a day (QID) | RECTAL | Status: DC | PRN

## 2014-11-14 MED ORDER — METOCLOPRAMIDE HCL 5 MG/ML IJ SOLN
10.0000 mg | Freq: Once | INTRAMUSCULAR | Status: AC
  Administered 2014-11-14: 10 mg via INTRAVENOUS
  Filled 2014-11-14: qty 2

## 2014-11-14 MED ORDER — IOHEXOL 300 MG/ML  SOLN
100.0000 mL | Freq: Once | INTRAMUSCULAR | Status: AC | PRN
  Administered 2014-11-14: 100 mL via INTRAVENOUS

## 2014-11-14 NOTE — H&P (Signed)
CC: Nausea/vomiting x 1 day, abdominal pain  HPI: Mr. Yagi is a pleasant 39 yo M who had recently undergone sigmoid colectomy and had recently been admitted for perianastamotic inflammation who presents with 1 day of nausea and subsequent abdominal pain.  He says that he has been fine since his discharge (and I had seen him recently in the office and he was doing well) and then ate some chicken wings last night.  He later began to vomit.  Subsequently, while vomiting he thought he felt a "pop" and then began having some lower left abdominal pain. No pain currently.  Also c/o some chills.  Had one large BM yesterday and then a smaller on since.  Has been on outpatient augmentin but did stop his miralax.  Otherwise no fevers, chest pain, shortness of breath, cough, dysuria/hematuria.  Active Ambulatory Problems    Diagnosis Date Noted  . Diverticulitis 08/19/2014  . Ileus 08/19/2014  . Hypertension 08/19/2014  . Diverticulitis large intestine w/o perforation or abscess w/o bleeding 09/07/2014  . Diverticulitis of colon 09/26/2014  . Non-intractable cyclical vomiting with nausea 09/26/2014  . Benign neoplasm of sigmoid colon   . Diverticulitis of large intestine without perforation or abscess without bleeding   . Nausea with vomiting   . Esophageal hemorrhage   . Gastric catarrh   . Idiopathic colitis   . Colitis, regional 10/31/2014   Resolved Ambulatory Problems    Diagnosis Date Noted  . No Resolved Ambulatory Problems   Past Medical History  Diagnosis Date  . IBS (irritable bowel syndrome)   . Nephrolithiasis   . Arthritis   . Wears contact lenses    Past Surgical History  Procedure Laterality Date  . Inguinal hernia repair Right   . Kidney stone surgery      extraction/lithotripsy  . Esophagogastroduodenoscopy (egd) with propofol N/A 10/03/2014    Procedure: ESOPHAGOGASTRODUODENOSCOPY (EGD) WITH PROPOFOL;  Surgeon: Lucilla Lame, MD;  Location: [place];  Service:  Endoscopy;  Laterality: N/A;  . Colonoscopy with propofol N/A 10/03/2014    Procedure: COLONOSCOPY WITH PROPOFOL;  Surgeon: Lucilla Lame, MD;  Location: [place];  Service: Endoscopy;  Laterality: N/A;  . Laparoscopic sigmoid colectomy N/A 10/19/2014    Procedure: LAPAROSCOPIC ASSISTED  SIGMOID COLECTOMY;  Surgeon: Marlyce Huge, MD;  Location: [facility];  Service: General;  Laterality: N/A;  . Colon resection  10/30/2014   .   Medication List    ASK your doctor about these medications        amoxicillin-clavulanate 875-125 MG per tablet  Commonly known as:  AUGMENTIN  Take 1 tablet by mouth every 12 (twelve) hours.     ibuprofen 600 MG tablet  Commonly known as:  ADVIL,MOTRIN  Take 1 tablet (600 mg total) by mouth every 8 (eight) hours.     losartan-hydrochlorothiazide 100-25 MG per tablet  Commonly known as:  HYZAAR  Take 1 tablet by mouth daily.     ondansetron 4 MG tablet  Commonly known as:  ZOFRAN  Take 4 mg by mouth every 8 (eight) hours as needed for nausea or vomiting.     oxyCODONE-acetaminophen 5-325 MG per tablet  Commonly known as:  ROXICET  Take 1-2 tablets by mouth every 4 (four) hours as needed for moderate pain.     pantoprazole 40 MG tablet  Commonly known as:  PROTONIX  Take 1 tablet (40 mg total) by mouth daily.       Allergies  Allergen Reactions  . Amlodipine  Other (See Comments)    Reaction:  Numbness in arms    Family History  Problem Relation Age of Onset  . Hypertension Mother   . Osteoarthritis Mother   . Heart attack Father   . Hypertension Brother   . Alzheimer's disease Maternal Aunt   . Lung cancer    . Colon cancer Maternal Grandmother 75   Social History   Social History  . Marital Status: Married    Spouse Name: N/A  . Number of Children: 4  . Years of Education: N/A   Occupational History  . Deliver sheet rock    Social History Main Topics  . Smoking status: Former Research scientist (life sciences)  . Smokeless tobacco: Never  Used  . Alcohol Use: 0.0 oz/week    0 Standard drinks or equivalent per week     Comment: "occassional" couple drinks per month  . Drug Use: No  . Sexual Activity: Not on file   Other Topics Concern  . Not on file   Social History Narrative   Lives with wife & children   ROS: Full ROS obtained, pertinent positives and negatives as above  Blood pressure 140/98, pulse 113, temperature 98.4 F (36.9 C), temperature source Oral, resp. rate 18, height 5\' 9"  (1.753 m), weight 90.719 kg (200 lb), SpO2 100 %. GEN: NAD/A&Ox3 FACE: no obvious facial trauma, normal external nose, normal external ears EYES: no scleral icterus, no conjunctivitis HEAD: normocephalic atraumatic CV: RRR, no MRG RESP: moving air well, lungs clear ABD: soft, nontender, nondistended EXT: moving all ext well, strength 5/5 NEURO: cnII-XII grossly intact, sensation intact all 4 ext  Labs: Personally reviewed: significant for WBC 10.4 (80% neutrophils) Cl 95  CT: Personally reviewed: Perianastamotic soft tissue inflammation extending into proximal colon, diffuse diverticulosis  A/P 39 yo s/p sigmoid colectomy, admit with N/V, abd pain.  In contrast with prior admission, his pain began after vomiting and at a specific point after vomiting.  Unsure if vomiting is due to this inflammation or if inflammation is still as before and resolving clinically.  In addition, her pain due to its presentation may be musculoskeletal or may be due to inflammation.  Will admit for pain control including antiinflammatory agents, IV antibiotics and bowel rest.  Will continue to reevaluate.

## 2014-11-14 NOTE — ED Notes (Signed)
Pt nauseas at this time and reports feeling unable to finish contrast drink. MD notified.

## 2014-11-14 NOTE — Telephone Encounter (Signed)
Spoke with Dr. Marina Gravel at this time. He would like patient sent to ED immediately.  Spoke with patient's wife Edward Harris) at this time and explained this information to her. She verbalizes understanding and will take Edward Harris to the Emergency Department.

## 2014-11-14 NOTE — ED Notes (Signed)
Pt to CT

## 2014-11-14 NOTE — ED Notes (Signed)
Pt reports surgery 1 month ago, Dr. Frann Rider had colectomy due to diverticulitis. Had infection 1 week post op, admitted back to hospital for 5 days. Last night developed vomiting, sweating, abdominal pain.

## 2014-11-14 NOTE — Telephone Encounter (Signed)
This is a patient of Dr Rexene Edison - he had a colectomy on 7/15. Patient states he started vomiting last night and it has continued on through today. He is vomiting like he did before having surgery and certain foods trigger the vomiting.  He is now having pain on the side where they reconnected him inside. He states it is sore on that side and it feels like he has pulled something inside. Please call and advise.

## 2014-11-14 NOTE — ED Provider Notes (Signed)
[facility] Emergency Department Provider Note  ____________________________________________  Time seen: 4:30 PM  I have reviewed the triage vital signs and the nursing notes.   HISTORY  Chief Complaint Abdominal Pain and Emesis    HPI Edward Harris is a 39 y.o. male who comes to the ED today complaining of diffuse abdominal pain and vomiting since yesterday afternoon. She had a partial colon resection about one month ago from surgery Dr. Rexene Edison due to diverticulosis. This was complicated by a intra-abdominal infection about 1 week later, but the patient reports since leaving the hospital the second time he has been doing just fine. His bowel movements have been regular. Yesterday he had a large bowel movement without straining, and afterward he started developing nausea and vomiting which was then followed by diffuse abdominal pain mainly in the epigastric and left lower quadrant areas. He denies any change in bowel movements such as black or bloody. No fevers, but he did have chills and night sweats last night. No chest pain or shortness of breath, no syncope. No urinary symptoms     Past Medical History  Diagnosis Date  . Diverticulitis   . Hypertension   . IBS (irritable bowel syndrome)   . Nephrolithiasis   . Arthritis     ankles  . Wears contact lenses     Patient Active Problem List   Diagnosis Date Noted  . 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 Benign neoplasm of sigmoid colon   . Diverticulitis of large intestine without perforation or abscess without bleeding   . Nausea with vomiting   . Esophageal hemorrhage   . Gastric catarrh   . Idiopathic colitis   . Diverticulitis of colon 09/26/2014  . Non-intractable cyclical vomiting with nausea 09/26/2014  . Diverticulitis large intestine w/o perforation or abscess w/o bleeding 09/07/2014  . Diverticulitis 08/19/2014  . Ileus 08/19/2014  . Hypertension 08/19/2014    Past Surgical History   Procedure Laterality Date  . Inguinal hernia repair Right   . Kidney stone surgery      extraction/lithotripsy  . Esophagogastroduodenoscopy (egd) with propofol N/A 10/03/2014    Procedure: ESOPHAGOGASTRODUODENOSCOPY (EGD) WITH PROPOFOL;  Surgeon: Lucilla Lame, MD;  Location: [place];  Service: Endoscopy;  Laterality: N/A;  . Colonoscopy with propofol N/A 10/03/2014    Procedure: COLONOSCOPY WITH PROPOFOL;  Surgeon: Lucilla Lame, MD;  Location: [place];  Service: Endoscopy;  Laterality: N/A;  . Laparoscopic sigmoid colectomy N/A 10/19/2014    Procedure: LAPAROSCOPIC ASSISTED  SIGMOID COLECTOMY;  Surgeon: Marlyce Huge, MD;  Location: [facility];  Service: General;  Laterality: N/A;  . Colon resection  10/30/2014    Current Outpatient Rx  Name  Route  Sig  Dispense  Refill  . amoxicillin-clavulanate (AUGMENTIN) 875-125 MG per tablet   Oral   Take 1 tablet by mouth every 12 (twelve) hours.   22 tablet   0   . ibuprofen (ADVIL,MOTRIN) 600 MG tablet   Oral   Take 1 tablet (600 mg total) by mouth every 8 (eight) hours.   30 tablet   0   . losartan-hydrochlorothiazide (HYZAAR) 100-25 MG per tablet   Oral   Take 1 tablet by mouth daily.      3   . ondansetron (ZOFRAN) 4 MG tablet   Oral   Take 4 mg by mouth every 8 (eight) hours as needed for nausea or vomiting.         Marland Kitchen oxyCODONE-acetaminophen (ROXICET) 5-325 MG per tablet  Oral   Take 1-2 tablets by mouth every 4 (four) hours as needed for moderate pain.   30 tablet   0   . pantoprazole (PROTONIX) 40 MG tablet   Oral   Take 1 tablet (40 mg total) by mouth daily.   31 tablet   2     Allergies Amlodipine  Family History  Problem Relation Age of Onset  . Hypertension Mother   . Osteoarthritis Mother   . Heart attack Father   . Hypertension Brother   . Alzheimer's disease Maternal Aunt   . Lung cancer    . Colon cancer Maternal Grandmother 75    Social History Social History   Substance Use Topics  . Smoking status: Former Research scientist (life sciences)  . Smokeless tobacco: Never Used  . Alcohol Use: 0.0 oz/week    0 Standard drinks or equivalent per week     Comment: "occassional" couple drinks per month    Review of Systems  Constitutional: Positive chills. No weight changes Eyes:No blurry vision or double vision.  ENT: No sore throat. Cardiovascular: No chest pain. Respiratory: No dyspnea or cough. Gastrointestinal: Generalized abdominal pain with vomiting. Normal bowel movements..  No BRBPR or melena. Genitourinary: Negative for dysuria, urinary retention, bloody urine, or difficulty urinating. Musculoskeletal: Negative for back pain. No joint swelling or pain. Skin: Negative for rash. Neurological: Negative for headaches, focal weakness or numbness. Psychiatric:No anxiety or depression.   Endocrine:No hot/cold intolerance, changes in energy, or sleep difficulty.  10-point ROS otherwise negative.  ____________________________________________   PHYSICAL EXAM:  VITAL SIGNS: ED Triage Vitals  Enc Vitals Group     BP 11/14/14 1613 140/98 mmHg     Pulse Rate 11/14/14 1613 113     Resp 11/14/14 1613 18     Temp 11/14/14 1613 98.4 F (36.9 C)     Temp Source 11/14/14 1613 Oral     SpO2 11/14/14 1613 100 %     Weight 11/14/14 1613 200 lb (90.719 kg)     Height 11/14/14 1613 5\' 9"  (1.753 m)     Head Cir --      Peak Flow --      Pain Score --      Pain Loc --      Pain Edu? --      Excl. in [place]? --      Constitutional: Alert and oriented. Moderate distress due to pain. Eyes: No scleral icterus. No conjunctival pallor. PERRL. EOMI ENT   Head: Normocephalic and atraumatic.   Nose: No congestion/rhinnorhea. No septal hematoma   Mouth/Throat: Dry mucous membranes, no pharyngeal erythema. No peritonsillar mass. No uvula shift.   Neck: No stridor. No SubQ emphysema. No meningismus. Hematological/Lymphatic/Immunilogical: No cervical  lymphadenopathy. Cardiovascular: RRR. Normal and symmetric distal pulses are present in all extremities. No murmurs, rubs, or gallops. Respiratory: Normal respiratory effort without tachypnea nor retractions. Breath sounds are clear and equal bilaterally. No wheezes/rales/rhonchi. Gastrointestinal: Positive guarding. Epigastric and left lower quadrant tenderness. No rebound. No rigidity. There is no CVA tenderness. No pulsatile mass or bruit. Genitourinary: deferred Musculoskeletal: Nontender with normal range of motion in all extremities. No joint effusions.  No lower extremity tenderness.  No edema. Neurologic:   Normal speech and language.  CN 2-10 normal. Motor grossly intact. No pronator drift.  Normal gait. No gross focal neurologic deficits are appreciated.  Skin:  Skin is warm, dry and intact. No rash noted.  No petechiae, purpura, or bullae. Psychiatric: Mood and affect are normal. Speech  and behavior are normal. Patient exhibits appropriate insight and judgment.  ____________________________________________    LABS (pertinent positives/negatives) (all labs ordered are listed, but only abnormal results are displayed) Labs Reviewed  COMPREHENSIVE METABOLIC PANEL - Abnormal; Notable for the following:    Chloride 95 (*)    Glucose, Bld 134 (*)    Total Protein 8.8 (*)    Total Bilirubin 1.5 (*)    All other components within normal limits  CBC WITH DIFFERENTIAL/PLATELET - Abnormal; Notable for the following:    Neutro Abs 8.8 (*)    All other components within normal limits  LIPASE, BLOOD  URINALYSIS COMPLETEWITH MICROSCOPIC ([facility] ONLY)   ____________________________________________   EKG    ____________________________________________    RADIOLOGY  CT abdomen pelvis is stable since the last one showing inflammation of the descending colon in the periumbilical area without any evidence of perforation or free air or acute infection or  abscess.  ____________________________________________   PROCEDURES  ____________________________________________   INITIAL IMPRESSION / ASSESSMENT AND PLAN / ED COURSE  Pertinent labs & imaging results that were available during my care of the patient were reviewed by me and considered in my medical decision making (see chart for details).  Generalized abdominal pain with vomiting and tenderness after recent partial colectomy. Especially given the application with the intra-abdominal infection, we will repeat a CT scan of the abdomen and pelvis while checking labs and giving IV fluids Zofran Phenergan and Dilaudid. ----------------------------------------- 8:54 PM on 11/14/2014 -----------------------------------------  Signs remained stable. Symptoms remains controlled except for recurrent nausea after drinking oral contrast for the CT. Abdomen is benign on exam. CT is stable since last time revealing stable inflammation of unclear etiology but no acute findings. I discussed this with Dr. Rexene Edison of surgery who will evaluate the patient in the ED. We will continue to provide anti-emetics for symptom relief to see if we get the patient tolerating oral intake.  ----------------------------------------- 9:19 PM on 11/14/2014 -----------------------------------------  Discussed with Lundquist who plans to admit for pain control and IV antibiotics for colitis. ____________________________________________   FINAL CLINICAL IMPRESSION(S) / ED DIAGNOSES  Final diagnoses:  Generalized abdominal pain  Nausea      Carrie Mew, MD 11/14/14 2120

## 2014-11-15 LAB — BASIC METABOLIC PANEL
Anion gap: 11 (ref 5–15)
BUN: 13 mg/dL (ref 6–20)
CHLORIDE: 100 mmol/L — AB (ref 101–111)
CO2: 26 mmol/L (ref 22–32)
CREATININE: 1.02 mg/dL (ref 0.61–1.24)
Calcium: 9.2 mg/dL (ref 8.9–10.3)
GFR calc Af Amer: >60 mL/min (ref >60)
GFR calc non Af Amer: >60 mL/min (ref >60)
GLUCOSE: 95 mg/dL (ref 65–99)
Potassium: 3.5 mmol/L (ref 3.5–5.1)
Sodium: 137 mmol/L (ref 135–145)

## 2014-11-15 LAB — CBC
HCT: 37.2 % — ABNORMAL LOW (ref 40.0–52.0)
Hemoglobin: 12.5 g/dL — ABNORMAL LOW (ref 13.0–18.0)
MCH: 28.6 pg (ref 26.0–34.0)
MCHC: 33.6 g/dL (ref 32.0–36.0)
MCV: 85.2 fL (ref 80.0–100.0)
Platelets: 301 10*3/uL (ref 150–440)
RBC: 4.37 MIL/uL — ABNORMAL LOW (ref 4.40–5.90)
RDW: 13.5 % (ref 11.5–14.5)
WBC: 10.4 10*3/uL (ref 3.8–10.6)

## 2014-11-15 NOTE — Progress Notes (Signed)
Surgery Progress Note  S: No acute issues.  Nausea controlled, pain controlled.    O:Blood pressure 126/75, pulse 58, temperature 97.7 F (36.5 C), temperature source Oral, resp. rate 18, height 5\' 9"  (1.753 m), weight 200 lb (90.719 kg), SpO2 100 %. GEN: NAD/A&Ox3 ABD: soft, min tender, nondistended  A/P 39 yo s/p sigmoid colectomy, now with inflammation around anastamosis, recurrent pain/nausea.  Pain at this presentation has improved prior to previous presentation as has nausea.  I feel that this is likely the result of a small postop leak.  If continues to recur, will obtain barium enema to eval for persistent leak and have discussed potential diversion to encourage healing.  I do think that this is warranted at this time.  Will continue current plan.

## 2014-11-15 NOTE — Progress Notes (Signed)
Surgery Progress Note  S: Nausea improved O: Blood pressure 148/90, pulse 90, temperature 98.6 F (37 C), temperature source Oral, resp. rate 14, height 5\' 9"  (1.753 m), weight 90.719 kg (200 lb), SpO2 100 %. GEN: NAD/A&Ox3 ABD: Soft, mild tender, nondistended  A/P 39 yo s/p sigmoid colectomy, readmit with nausea/pain - IVF - liquids - IV abx - pain control

## 2014-11-15 NOTE — Progress Notes (Signed)
   11/15/14 1000  Clinical Encounter Type  Visited With Family  Visit Type Spiritual support  Spiritual Encounters  Spiritual Needs Prayer  Stress Factors  Family Stress Factors Health changes   Status: 14yrs male/Abdominal pain Family: wife bedside Faith: Christian Visit Assessment: The chaplain visited with the patient's wife prior to her bringing breakfast. She is concerned because this is her husband's 3rd visit. Prayers will be lifted on their behalf.  Pastoral Care: [phone] or available by online request

## 2014-11-16 NOTE — Progress Notes (Signed)
Subjective:   He has less pain and no significant nausea at the present time. He does not have any evidence for increased fever or persistently increasing abdominal symptoms. He is tolerating liquid diet.  Vital signs in last 24 hours: Temp:  [97.6 F (36.4 C)-98.5 F (36.9 C)] 97.6 F (36.4 C) (08/12 0756) Pulse Rate:  [56-59] 56 (08/12 0756) Resp:  [16-20] 16 (08/12 0756) BP: (108-126)/(62-75) 121/68 mmHg (08/12 0756) SpO2:  [100 %] 100 % (08/12 0756) Last BM Date: 11/14/14  Intake/Output from previous day: 08/11 0701 - 08/12 0700 In: 3494 [P.O.:1500; I.V.:1994] Out: 1625 [Urine:1625]  Exam:  His exam is large unremarkable. His abdomen is soft nontender with no rebound or guarding.  Lab Results:  CBC  Recent Labs  11/14/14 1715 11/15/14 0441  WBC 10.4 10.4  HGB 14.6 12.5*  HCT 43.6 37.2*  PLT 365 301   CMP     Component Value Date/Time   NA 137 11/15/2014 0441   NA 136 07/23/2014 1007   K 3.5 11/15/2014 0441   K 4.1 07/23/2014 1007   CL 100* 11/15/2014 0441   CL 101 07/23/2014 1007   CO2 26 11/15/2014 0441   CO2 24 07/23/2014 1007   GLUCOSE 95 11/15/2014 0441   GLUCOSE 173* 07/23/2014 1007   BUN 13 11/15/2014 0441   BUN 18 07/23/2014 1007   CREATININE 1.02 11/15/2014 0441   CREATININE 0.93 07/23/2014 1007   CALCIUM 9.2 11/15/2014 0441   CALCIUM 8.9 07/23/2014 1007   PROT 8.8* 11/14/2014 1715   PROT 7.9 07/23/2014 1007   ALBUMIN 5.0 11/14/2014 1715   ALBUMIN 4.5 07/23/2014 1007   AST 28 11/14/2014 1715   AST 29 07/23/2014 1007   ALT 58 11/14/2014 1715   ALT 31 07/23/2014 1007   ALKPHOS 92 11/14/2014 1715   ALKPHOS 83 07/23/2014 1007   BILITOT 1.5* 11/14/2014 1715   BILITOT 1.5* 07/23/2014 1007   GFRNONAA >60 11/15/2014 0441   GFRNONAA >60 07/23/2014 1007   GFRAA >60 11/15/2014 0441   GFRAA >60 07/23/2014 1007   PT/INR No results for input(s): LABPROT, INR in the last 72 hours.  Studies/Results: Ct Abdomen Pelvis W Contrast  11/14/2014    CLINICAL DATA:  Abdominal pain with vomiting  EXAM: CT ABDOMEN AND PELVIS WITH CONTRAST  TECHNIQUE: Multidetector CT imaging of the abdomen and pelvis was performed using the standard protocol following bolus administration of intravenous contrast. Oral contrast was also administered.  CONTRAST:  145mL OMNIPAQUE IOHEXOL 300 MG/ML  SOLN  COMPARISON:  October 30, 2014 and Aug 15, 2013  FINDINGS: There is slight bibasilar atelectatic change. There is an apparent scar in the anterior left base region, stable. Lung bases otherwise are clear.  There is a tiny focus of decreased attenuation in the inferior posterior segment of the right lobe of the liver consistent with either a tiny cyst or hamartoma. No other focal liver lesions are identified. Gallbladder wall is not appreciably thickened. There is no biliary duct dilatation.  Spleen, pancreas, and adrenals appear normal. Kidneys bilaterally show no mass or hydronephrosis. There is no renal or ureteral calculus on either side.  In the pelvis, the urinary bladder is midline with normal wall thickness. There is fat in both inguinal rings. Patient has had a previous hernia repair on the right. On the left, a hernia defect is seen in the inguinal region with only fat extending into this area, stable. There is no pelvic mass or pelvic fluid collection. The appendix appears  normal.  The patient has had a colonic anastomosis at the descending colon-sigmoid junction. There remains moderate wall thickening and mesenteric inflammation in this area. There are multiple diverticula in this area focally. Mesenteric inflammation extends superiorly to the level of the proximal descending colon, similar to prior study. This area appears quite similar to recent CT. There is no frank abscess or microperforation in this area. There has been no appreciable extension of inflammation in this area compared to recent prior study. In addition, there is mesenteric stranding in the periumbilical  region consistent with recent surgery in this area. No abscess or fluid collection is seen in this area.  Appendix appears normal.  There are scattered diverticula throughout the colon without other areas suggesting diverticulitis.  No bowel obstruction.  No free air or portal venous air.  There is no ascites, adenopathy, or abscess in the abdomen or pelvis. There is no abdominal aortic aneurysm. There are no blastic or lytic bone lesions.  IMPRESSION: There remains inflammatory change in the area of recent surgery at the distal descending colon-sigmoid junction consistent with diverticulitis. There is moderate surrounding mesenteric thickening/ stranding as well as a small amount of fluid tracking into the left lateral conal fascia. Mesenteric inflammation tracks proximally along the proximal to mid descending colon, not significantly changed from recent prior study. Overall there has been very little change in the degree of inflammation compared to recent prior study. There is no demonstrable frank abscess or microperforation in this area. The degree of inflammation in this area is similar to recent prior study. There has been no evidence of resolution of inflammation compared to the previous study.  There is stranding in the periumbilical region consistent with recent surgery. No abscess or fluid collection seen in this area.  There is colonic diverticulosis at multiple sites in the colon. No other areas are seen suggesting diverticulitis, however. Appendix appears normal.  No renal or ureteral calculus.  No hydronephrosis.  Small left inguinal hernia containing only fat. Previous inguinal hernia repair on the right noted.   Electronically Signed   By: Lowella Grip III M.D.   On: 11/14/2014 19:28    Assessment/Plan: His laboratory values remained stable. His exam is improved. We will continue his antibiotic therapy as planned. I do not see any indication for further intervention at this time. We will  advance his diet as requested. This plan was discussed with the patient and his wife.

## 2014-11-17 DIAGNOSIS — R1012 Left upper quadrant pain: Secondary | ICD-10-CM | POA: Diagnosis not present

## 2014-11-17 DIAGNOSIS — R110 Nausea: Secondary | ICD-10-CM | POA: Diagnosis not present

## 2014-11-17 LAB — CBC
HCT: 35.7 % — ABNORMAL LOW (ref 40.0–52.0)
HEMOGLOBIN: 11.8 g/dL — AB (ref 13.0–18.0)
MCH: 28.2 pg (ref 26.0–34.0)
MCHC: 33.2 g/dL (ref 32.0–36.0)
MCV: 84.9 fL (ref 80.0–100.0)
Platelets: 250 10*3/uL (ref 150–440)
RBC: 4.2 MIL/uL — ABNORMAL LOW (ref 4.40–5.90)
RDW: 13.2 % (ref 11.5–14.5)
WBC: 6.5 10*3/uL (ref 3.8–10.6)

## 2014-11-17 MED ORDER — FAMOTIDINE 20 MG PO TABS
20.0000 mg | ORAL_TABLET | Freq: Every day | ORAL | Status: DC
  Administered 2014-11-17 – 2014-11-20 (×4): 20 mg via ORAL
  Filled 2014-11-17 (×4): qty 1

## 2014-11-17 NOTE — Progress Notes (Signed)
Subjective:   He is feeling better overall. He denies any significant pain. He's taking minimal amounts of pain medicine. He was able to ambulate multiple times last evening. He is tolerating a full liquid diet. His white blood cell count is down 6.5. Overall he significantly improved.  Vital signs in last 24 hours: Temp:  [97.6 F (36.4 C)-98.2 F (36.8 C)] 97.9 F (36.6 C) (08/13 0018) Pulse Rate:  [56-73] 65 (08/13 0018) Resp:  [16-20] 20 (08/13 0018) BP: (115-132)/(68-81) 132/71 mmHg (08/13 0018) SpO2:  [99 %-100 %] 99 % (08/13 0018) Last BM Date: 11/14/14  Intake/Output from previous day: 08/12 0701 - 08/13 0700 In: 4009 [P.O.:1530; I.V.:2479] Out: 3125 [Urine:3125]  Exam:  Abdomen soft with minimal abdominal tenderness no rebound and no guarding no point tenderness.  Lab Results:  CBC  Recent Labs  11/15/14 0441 11/17/14 0610  WBC 10.4 6.5  HGB 12.5* 11.8*  HCT 37.2* 35.7*  PLT 301 250   CMP     Component Value Date/Time   NA 137 11/15/2014 0441   NA 136 07/23/2014 1007   K 3.5 11/15/2014 0441   K 4.1 07/23/2014 1007   CL 100* 11/15/2014 0441   CL 101 07/23/2014 1007   CO2 26 11/15/2014 0441   CO2 24 07/23/2014 1007   GLUCOSE 95 11/15/2014 0441   GLUCOSE 173* 07/23/2014 1007   BUN 13 11/15/2014 0441   BUN 18 07/23/2014 1007   CREATININE 1.02 11/15/2014 0441   CREATININE 0.93 07/23/2014 1007   CALCIUM 9.2 11/15/2014 0441   CALCIUM 8.9 07/23/2014 1007   PROT 8.8* 11/14/2014 1715   PROT 7.9 07/23/2014 1007   ALBUMIN 5.0 11/14/2014 1715   ALBUMIN 4.5 07/23/2014 1007   AST 28 11/14/2014 1715   AST 29 07/23/2014 1007   ALT 58 11/14/2014 1715   ALT 31 07/23/2014 1007   ALKPHOS 92 11/14/2014 1715   ALKPHOS 83 07/23/2014 1007   BILITOT 1.5* 11/14/2014 1715   BILITOT 1.5* 07/23/2014 1007   GFRNONAA >60 11/15/2014 0441   GFRNONAA >60 07/23/2014 1007   GFRNONAA >60 04/02/2014 1218   GFRAA >60 11/15/2014 0441   GFRAA >60 07/23/2014 1007   GFRAA >60  04/02/2014 1218   PT/INR No results for input(s): LABPROT, INR in the last 72 hours.  Studies/Results: No results found.  Assessment/Plan: He continues to improve on his antibiotic therapy. Because of the readmission I think we will treat him another day or 2 on IV antibiotics before considering discharge. He is in agreement with this plan.

## 2014-11-18 DIAGNOSIS — R110 Nausea: Secondary | ICD-10-CM | POA: Diagnosis not present

## 2014-11-18 MED ORDER — CEPHALEXIN 500 MG PO CAPS
500.0000 mg | ORAL_CAPSULE | Freq: Three times a day (TID) | ORAL | Status: DC
  Administered 2014-11-18 – 2014-11-20 (×6): 500 mg via ORAL
  Filled 2014-11-18 (×6): qty 1

## 2014-11-18 MED ORDER — CLINDAMYCIN HCL 150 MG PO CAPS
300.0000 mg | ORAL_CAPSULE | Freq: Three times a day (TID) | ORAL | Status: DC
  Administered 2014-11-18 – 2014-11-20 (×6): 300 mg via ORAL
  Filled 2014-11-18 (×6): qty 2

## 2014-11-18 NOTE — Progress Notes (Signed)
Subjective:   He is feeling better this morning. He was able to eat some regular diet this morning without nausea. He did have an episode of nausea last night. His pain is much improved but he still taking IV pain medication.   Vital signs in last 24 hours: Temp:  [98.2 F (36.8 C)-98.8 F (37.1 C)] 98.2 F (36.8 C) (08/14 0757) Pulse Rate:  [63-68] 63 (08/14 0757) Resp:  [16-17] 17 (08/14 0757) BP: (117-126)/(73-86) 117/76 mmHg (08/14 0757) SpO2:  [99 %-100 %] 99 % (08/14 0757) Last BM Date: 11/14/14  Intake/Output from previous day: 08/13 0701 - 08/14 0700 In: 2280 [P.O.:1180; I.V.:1100] Out: 2726 [Urine:2725; Stool:1]  Exam:  His abdomen is benign. He does not have any significant abdominal tenderness. He has active bowel sounds.  Lab Results:  CBC  Recent Labs  11/17/14 0610  WBC 6.5  HGB 11.8*  HCT 35.7*  PLT 250   CMP     Component Value Date/Time   NA 137 11/15/2014 0441   NA 136 07/23/2014 1007   K 3.5 11/15/2014 0441   K 4.1 07/23/2014 1007   CL 100* 11/15/2014 0441   CL 101 07/23/2014 1007   CO2 26 11/15/2014 0441   CO2 24 07/23/2014 1007   GLUCOSE 95 11/15/2014 0441   GLUCOSE 173* 07/23/2014 1007   BUN 13 11/15/2014 0441   BUN 18 07/23/2014 1007   CREATININE 1.02 11/15/2014 0441   CREATININE 0.93 07/23/2014 1007   CALCIUM 9.2 11/15/2014 0441   CALCIUM 8.9 07/23/2014 1007   PROT 8.8* 11/14/2014 1715   PROT 7.9 07/23/2014 1007   ALBUMIN 5.0 11/14/2014 1715   ALBUMIN 4.5 07/23/2014 1007   AST 28 11/14/2014 1715   AST 29 07/23/2014 1007   ALT 58 11/14/2014 1715   ALT 31 07/23/2014 1007   ALKPHOS 92 11/14/2014 1715   ALKPHOS 83 07/23/2014 1007   BILITOT 1.5* 11/14/2014 1715   BILITOT 1.5* 07/23/2014 1007   GFRNONAA >60 11/15/2014 0441   GFRNONAA >60 07/23/2014 1007   GFRNONAA >60 04/02/2014 1218   GFRAA >60 11/15/2014 0441   GFRAA >60 07/23/2014 1007   GFRAA >60 04/02/2014 1218   PT/INR No results for input(s): LABPROT, INR in the last 72  hours.  Studies/Results: No results found.  Assessment/Plan: He continues to improve. We will switch him to by mouth antibiotics today with a plan to switch him to by mouth pain medicine tomorrow. If he is able to handle both of those without a change in his symptoms we will anticipate discharge. He is in agreement.

## 2014-11-19 ENCOUNTER — Encounter: Payer: Managed Care, Other (non HMO) | Admitting: Surgery

## 2014-11-19 MED ORDER — HYDROCODONE-ACETAMINOPHEN 5-325 MG PO TABS
2.0000 | ORAL_TABLET | ORAL | Status: DC | PRN
  Administered 2014-11-19 – 2014-11-20 (×3): 2 via ORAL
  Filled 2014-11-19 (×3): qty 2

## 2014-11-19 NOTE — Progress Notes (Signed)
Subjective:  39 year old male admitted for abdominal pain and nausea/vomiting. He states that he has been feeling much better overnight. He has yet to have received any oral pain medication though. He has tolerated the oral antibiotics though. He has been passing gas and having bowel function.  Vital signs in last 24 hours: Temp:  [98.3 F (36.8 C)-99 F (37.2 C)] 98.3 F (36.8 C) (08/15 0742) Pulse Rate:  [64-75] 72 (08/15 0742) Resp:  [16-18] 16 (08/15 0742) BP: (128-143)/(82-89) 134/85 mmHg (08/15 0742) SpO2:  [100 %] 100 % (08/15 0742) Last BM Date: 11/17/14  Intake/Output from previous day: 08/14 0701 - 08/15 0700 In: 480 [P.O.:480] Out: 1850 [Urine:1850]  Exam: Gen: NAD Resp: CTA CV: RRR ABD: Soft, NT, ND, active bowel sounds. Midline incision healting well.  Lab Results:  CBC  Recent Labs  11/17/14 0610  WBC 6.5  HGB 11.8*  HCT 35.7*  PLT 250   CMP     Component Value Date/Time   NA 137 11/15/2014 0441   NA 136 07/23/2014 1007   K 3.5 11/15/2014 0441   K 4.1 07/23/2014 1007   CL 100* 11/15/2014 0441   CL 101 07/23/2014 1007   CO2 26 11/15/2014 0441   CO2 24 07/23/2014 1007   GLUCOSE 95 11/15/2014 0441   GLUCOSE 173* 07/23/2014 1007   BUN 13 11/15/2014 0441   BUN 18 07/23/2014 1007   CREATININE 1.02 11/15/2014 0441   CREATININE 0.93 07/23/2014 1007   CALCIUM 9.2 11/15/2014 0441   CALCIUM 8.9 07/23/2014 1007   PROT 8.8* 11/14/2014 1715   PROT 7.9 07/23/2014 1007   ALBUMIN 5.0 11/14/2014 1715   ALBUMIN 4.5 07/23/2014 1007   AST 28 11/14/2014 1715   AST 29 07/23/2014 1007   ALT 58 11/14/2014 1715   ALT 31 07/23/2014 1007   ALKPHOS 92 11/14/2014 1715   ALKPHOS 83 07/23/2014 1007   BILITOT 1.5* 11/14/2014 1715   BILITOT 1.5* 07/23/2014 1007   GFRNONAA >60 11/15/2014 0441   GFRNONAA >60 07/23/2014 1007   GFRNONAA >60 04/02/2014 1218   GFRAA >60 11/15/2014 0441   GFRAA >60 07/23/2014 1007   GFRAA >60 04/02/2014 1218   PT/INR No results for  input(s): LABPROT, INR in the last 72 hours.  Studies/Results: No results found.  Assessment/Plan: 39 year old male approx 1 month s/p sigmoid colectomy with inflammation at anastomosis Much improved since admission Will add oral pain medications Once pain controlled on PO meds will then plan for discharge home  [name], MD [facility]

## 2014-11-20 MED ORDER — CEPHALEXIN 500 MG PO CAPS: 500.0000 mg | ORAL_CAPSULE | Freq: Three times a day (TID) | ORAL | Status: DC

## 2014-11-20 MED ORDER — CLINDAMYCIN HCL 300 MG PO CAPS: 300.0000 mg | ORAL_CAPSULE | Freq: Three times a day (TID) | ORAL | Status: DC

## 2014-11-20 MED ORDER — ONDANSETRON 4 MG PO TBDP: 4.0000 mg | ORAL_TABLET | Freq: Four times a day (QID) | ORAL | Status: DC | PRN

## 2014-11-20 MED ORDER — HYDROCODONE-ACETAMINOPHEN 5-325 MG PO TABS: 2.0000 | ORAL_TABLET | ORAL | Status: DC | PRN

## 2014-11-20 NOTE — Discharge Summary (Signed)
Patient ID: Edward Harris MRN: [id number] DOB/AGE: 38-Apr-1977 38 y.o.  Admit date: 11/14/2014 Discharge date: 11/20/2014  Discharge Diagnoses:  Colitis  Procedures Performed: None   Discharged Condition: good  Hospital Course: Patient admitted from ER for abdominal pain and concerns for peri-anastomotic colitis. He was started on antibiotics and provided with pain control. He was able to tolerate PO and have normal bowel function prior to discharge.  Discharge Orders:  Continue PO medications Gradually resume normal activities Gradually increase diet   Disposition: 01-Home or Self Care  Discharge Medications:  Current facility-administered medications:  .  acetaminophen (TYLENOL) tablet 650 mg, 650 mg, Oral, Q6H PRN **OR** acetaminophen (TYLENOL) suppository 650 mg, 650 mg, Rectal, Q6H PRN, Marlyce Huge, MD .  cephALEXin (KEFLEX) capsule 500 mg, 500 mg, Oral, 3 times per day, [name], MD, 500 mg at 11/20/14 0600 .  clindamycin (CLEOCIN) capsule 300 mg, 300 mg, Oral, 3 times per day, [name], MD, 300 mg at 11/20/14 0600 .  famotidine (PEPCID) tablet 20 mg, 20 mg, Oral, Daily, [name], MD, 20 mg at 11/19/14 0950 .  heparin injection 5,000 Units, 5,000 Units, Subcutaneous, 3 times per day, Marlyce Huge, MD, 5,000 Units at 11/20/14 0600 .  HYDROcodone-acetaminophen (NORCO/VICODIN) 5-325 MG per tablet 2 tablet, 2 tablet, Oral, Q4H PRN, [name], MD, 2 tablet at 11/20/14 0907 .  HYDROmorphone (DILAUDID) injection 0.5-1 mg, 0.5-1 mg, Intravenous, Q3H PRN, Marlyce Huge, MD, 1 mg at 11/19/14 2341 .  ondansetron (ZOFRAN-ODT) disintegrating tablet 4 mg, 4 mg, Oral, Q6H PRN **OR** ondansetron (ZOFRAN) injection 4 mg, 4 mg, Intravenous, Q6H PRN, Marlyce Huge, MD, 4 mg at 11/18/14 0004  Follwup: Follow-up Information    Follow up with [facility]. Schedule an appointment as soon as possible for a visit in 2  weeks.   Why:  For wound re-check   Contact information:   [address] [place] [address] [phone]      Signed: [name] 11/20/2014, 9:40 AM

## 2014-11-20 NOTE — Discharge Instructions (Signed)

## 2014-11-20 NOTE — Progress Notes (Signed)
Pt A and O x 4. VSS. Pt tolerating diet well. Minimal complaints of pain with meds given to control. IV removed intact, prescriptions given. Pt voiced understanding of discharge instructions with no further questions. Pt discharged via wheelchair with nurse.

## 2014-12-09 ENCOUNTER — Encounter: Payer: Self-pay | Admitting: Emergency Medicine

## 2014-12-09 ENCOUNTER — Observation Stay
Admission: EM | Admit: 2014-12-09 | Discharge: 2014-12-12 | Disposition: A | Discharge status: Home / Self Care | Payer: Managed Care, Other (non HMO) | Attending: Surgery | Admitting: Surgery

## 2014-12-09 ENCOUNTER — Emergency Department: Payer: Managed Care, Other (non HMO)

## 2014-12-09 DIAGNOSIS — R52 Pain, unspecified: Secondary | ICD-10-CM

## 2014-12-09 DIAGNOSIS — Z86010 Personal history of colonic polyps: Secondary | ICD-10-CM | POA: Diagnosis not present

## 2014-12-09 DIAGNOSIS — K589 Irritable bowel syndrome without diarrhea: Secondary | ICD-10-CM | POA: Insufficient documentation

## 2014-12-09 DIAGNOSIS — K529 Noninfective gastroenteritis and colitis, unspecified: Secondary | ICD-10-CM | POA: Diagnosis not present

## 2014-12-09 DIAGNOSIS — M19071 Primary osteoarthritis, right ankle and foot: Secondary | ICD-10-CM | POA: Diagnosis not present

## 2014-12-09 DIAGNOSIS — Z87891 Personal history of nicotine dependence: Secondary | ICD-10-CM | POA: Diagnosis not present

## 2014-12-09 DIAGNOSIS — K5732 Diverticulitis of large intestine without perforation or abscess without bleeding: Secondary | ICD-10-CM | POA: Diagnosis not present

## 2014-12-09 DIAGNOSIS — K2970 Gastritis, unspecified, without bleeding: Secondary | ICD-10-CM | POA: Insufficient documentation

## 2014-12-09 DIAGNOSIS — Z9049 Acquired absence of other specified parts of digestive tract: Secondary | ICD-10-CM | POA: Diagnosis not present

## 2014-12-09 DIAGNOSIS — R1013 Epigastric pain: Secondary | ICD-10-CM | POA: Diagnosis present

## 2014-12-09 DIAGNOSIS — K5730 Diverticulosis of large intestine without perforation or abscess without bleeding: Secondary | ICD-10-CM | POA: Insufficient documentation

## 2014-12-09 DIAGNOSIS — R1110 Vomiting, unspecified: Principal | ICD-10-CM | POA: Insufficient documentation

## 2014-12-09 DIAGNOSIS — R509 Fever, unspecified: Secondary | ICD-10-CM | POA: Insufficient documentation

## 2014-12-09 DIAGNOSIS — M19072 Primary osteoarthritis, left ankle and foot: Secondary | ICD-10-CM | POA: Insufficient documentation

## 2014-12-09 DIAGNOSIS — I10 Essential (primary) hypertension: Secondary | ICD-10-CM | POA: Diagnosis not present

## 2014-12-09 DIAGNOSIS — Z87442 Personal history of urinary calculi: Secondary | ICD-10-CM | POA: Diagnosis not present

## 2014-12-09 DIAGNOSIS — R112 Nausea with vomiting, unspecified: Secondary | ICD-10-CM | POA: Diagnosis present

## 2014-12-09 DIAGNOSIS — D72829 Elevated white blood cell count, unspecified: Secondary | ICD-10-CM | POA: Insufficient documentation

## 2014-12-09 DIAGNOSIS — R1084 Generalized abdominal pain: Secondary | ICD-10-CM

## 2014-12-09 LAB — CBC
HEMATOCRIT: 44.7 % (ref 40.0–52.0)
HEMOGLOBIN: 15.2 g/dL (ref 13.0–18.0)
MCH: 28.6 pg (ref 26.0–34.0)
MCHC: 33.9 g/dL (ref 32.0–36.0)
MCV: 84.2 fL (ref 80.0–100.0)
Platelets: 297 10*3/uL (ref 150–440)
RBC: 5.31 MIL/uL (ref 4.40–5.90)
RDW: 13.9 % (ref 11.5–14.5)
WBC: 13.7 10*3/uL — ABNORMAL HIGH (ref 3.8–10.6)

## 2014-12-09 LAB — COMPREHENSIVE METABOLIC PANEL
ALK PHOS: 85 U/L (ref 38–126)
ALT: 59 U/L (ref 17–63)
ANION GAP: 11 (ref 5–15)
AST: 34 U/L (ref 15–41)
Albumin: 5 g/dL (ref 3.5–5.0)
BUN: 15 mg/dL (ref 6–20)
CALCIUM: 10.2 mg/dL (ref 8.9–10.3)
CO2: 27 mmol/L (ref 22–32)
Chloride: 99 mmol/L — ABNORMAL LOW (ref 101–111)
Creatinine, Ser: 0.96 mg/dL (ref 0.61–1.24)
GFR calc non Af Amer: >60 mL/min (ref >60)
Glucose, Bld: 134 mg/dL — ABNORMAL HIGH (ref 65–99)
POTASSIUM: 3.4 mmol/L — AB (ref 3.5–5.1)
SODIUM: 137 mmol/L (ref 135–145)
Total Bilirubin: 1.3 mg/dL — ABNORMAL HIGH (ref 0.3–1.2)
Total Protein: 8.6 g/dL — ABNORMAL HIGH (ref 6.5–8.1)

## 2014-12-09 LAB — LIPASE, BLOOD: LIPASE: 15 U/L — AB (ref 22–51)

## 2014-12-09 MED ORDER — ONDANSETRON HCL 4 MG/2ML IJ SOLN
4.0000 mg | INTRAMUSCULAR | Status: AC
  Administered 2014-12-09: 4 mg via INTRAVENOUS
  Filled 2014-12-09: qty 2

## 2014-12-09 MED ORDER — MORPHINE SULFATE (PF) 4 MG/ML IV SOLN
INTRAVENOUS | Status: AC
  Administered 2014-12-09: 4 mg via INTRAVENOUS
  Filled 2014-12-09: qty 1

## 2014-12-09 MED ORDER — MORPHINE SULFATE (PF) 4 MG/ML IV SOLN
4.0000 mg | Freq: Once | INTRAVENOUS | Status: AC
  Administered 2014-12-09: 4 mg via INTRAVENOUS

## 2014-12-09 MED ORDER — SODIUM CHLORIDE 0.9 % IV BOLUS (SEPSIS)
1000.0000 mL | Freq: Once | INTRAVENOUS | Status: AC
  Administered 2014-12-09: 1000 mL via INTRAVENOUS

## 2014-12-09 MED ORDER — HYDROMORPHONE HCL 1 MG/ML IJ SOLN
1.0000 mg | INTRAMUSCULAR | Status: AC
  Administered 2014-12-09: 1 mg via INTRAVENOUS
  Filled 2014-12-09: qty 1

## 2014-12-09 MED ORDER — ONDANSETRON HCL 4 MG/2ML IJ SOLN
4.0000 mg | Freq: Once | INTRAMUSCULAR | Status: AC | PRN
  Administered 2014-12-09: 4 mg via INTRAVENOUS
  Filled 2014-12-09: qty 2

## 2014-12-09 MED ORDER — IOHEXOL 240 MG/ML SOLN
25.0000 mL | Freq: Once | INTRAMUSCULAR | Status: AC | PRN
  Administered 2014-12-09: 25 mL via ORAL

## 2014-12-09 MED ORDER — IOHEXOL 300 MG/ML  SOLN
100.0000 mL | Freq: Once | INTRAMUSCULAR | Status: AC | PRN
  Administered 2014-12-09: 100 mL via INTRAVENOUS

## 2014-12-09 NOTE — ED Notes (Signed)
Pt reports on July 15th had colon resection; since then has had 2 bowel perforations, tx with antibiotics. Pt started vomiting again yesterday, hasn't been able to keep anything down.

## 2014-12-09 NOTE — ED Provider Notes (Signed)
[facility] Emergency Department Provider Note REMINDER - THIS NOTE IS NOT A FINAL MEDICAL RECORD UNTIL IT IS SIGNED. UNTIL THEN, THE CONTENT BELOW MAY REFLECT INFORMATION FROM A DOCUMENTATION TEMPLATE, NOT THE ACTUAL PATIENT VISIT. ____________________________________________  Time seen: Approximately 10:04 PM  I have reviewed the triage vital signs and the nursing notes.   HISTORY  Chief Complaint Emesis    HPI Edward Harris is a 39 y.o. male the history of previous abdominal surgeries, also a recent perianastomotic infection. Followed closely by [facility] surgical group.  Patient is wife are here today. He reports that he has not had a bowel movement since Friday, yesterday started feeling nauseated and vomiting up food now vomiting up "bile" and having pain in his mid abdomen at the area of his previous surgical scar. Wife reports he had a previous ileus that seems similar. He also thinks he might of had a low-grade temperature today, but did not measure one. No diarrhea. No chest pain or trouble breathing. Nausea and vomiting today.   Past Medical History  Diagnosis Date  . Diverticulitis   . Hypertension   . IBS (irritable bowel syndrome)   . Nephrolithiasis   . Arthritis     ankles  . Wears contact lenses     Patient Active Problem List   Diagnosis Date Noted  . Nausea   . Abdominal pain 11/14/2014  . 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 Benign neoplasm of sigmoid colon   . Diverticulitis of large intestine without perforation or abscess without bleeding   . Nausea with vomiting   . Esophageal hemorrhage   . Gastric catarrh   . Idiopathic colitis   . Diverticulitis of colon 09/26/2014  . Non-intractable cyclical vomiting with nausea 09/26/2014  . Diverticulitis large intestine w/o perforation or abscess w/o bleeding 09/07/2014  . Diverticulitis 08/19/2014  . Ileus 08/19/2014  . Hypertension 08/19/2014    Past Surgical History  Procedure  Laterality Date  . Inguinal hernia repair Right   . Kidney stone surgery      extraction/lithotripsy  . Esophagogastroduodenoscopy (egd) with propofol N/A 10/03/2014    Procedure: ESOPHAGOGASTRODUODENOSCOPY (EGD) WITH PROPOFOL;  Surgeon: Lucilla Lame, MD;  Location: [place];  Service: Endoscopy;  Laterality: N/A;  . Colonoscopy with propofol N/A 10/03/2014    Procedure: COLONOSCOPY WITH PROPOFOL;  Surgeon: Lucilla Lame, MD;  Location: [place];  Service: Endoscopy;  Laterality: N/A;  . Laparoscopic sigmoid colectomy N/A 10/19/2014    Procedure: LAPAROSCOPIC ASSISTED  SIGMOID COLECTOMY;  Surgeon: Marlyce Huge, MD;  Location: [facility];  Service: General;  Laterality: N/A;  . Colon resection  10/30/2014    Current Outpatient Rx  Name  Route  Sig  Dispense  Refill  . cephALEXin (KEFLEX) 500 MG capsule   Oral   Take 1 capsule (500 mg total) by mouth every 8 (eight) hours.   30 capsule   0   . clindamycin (CLEOCIN) 300 MG capsule   Oral   Take 1 capsule (300 mg total) by mouth every 8 (eight) hours.   30 capsule   0   . HYDROcodone-acetaminophen (NORCO/VICODIN) 5-325 MG per tablet   Oral   Take 2 tablets by mouth every 4 (four) hours as needed for severe pain.   30 tablet   0   . ibuprofen (ADVIL,MOTRIN) 600 MG tablet   Oral   Take 1 tablet (600 mg total) by mouth every 8 (eight) hours. Patient not taking: Reported on 11/14/2014  30 tablet   0   . losartan-hydrochlorothiazide (HYZAAR) 100-25 MG per tablet   Oral   Take 1 tablet by mouth daily.         . ondansetron (ZOFRAN-ODT) 4 MG disintegrating tablet   Oral   Take 1 tablet (4 mg total) by mouth every 6 (six) hours as needed for nausea.   20 tablet   0     Allergies Amlodipine  Family History  Problem Relation Age of Onset  . Hypertension Mother   . Osteoarthritis Mother   . Heart attack Father   . Hypertension Brother   . Alzheimer's disease Maternal Aunt   . Lung cancer    .  Colon cancer Maternal Grandmother 75    Social History Social History  Substance Use Topics  . Smoking status: Former Research scientist (life sciences)  . Smokeless tobacco: Never Used  . Alcohol Use: 0.0 oz/week    0 Standard drinks or equivalent per week     Comment: "occassional" couple drinks per month    Review of Systems Constitutional: Chills and feeling like he had a fever Eyes: No visual changes. ENT: No sore throat. Cardiovascular: Denies chest pain. Respiratory: Denies shortness of breath. Gastrointestinal: See history of present illness Genitourinary: Negative for dysuria. Musculoskeletal: Negative for back pain. Skin: Negative for rash. Neurological: Negative for headaches, focal weakness or numbness.  10-point ROS otherwise negative.  ____________________________________________   PHYSICAL EXAM:  VITAL SIGNS: ED Triage Vitals  Enc Vitals Group     BP 12/09/14 2024 162/111 mmHg     Pulse Rate 12/09/14 2024 101     Resp 12/09/14 2024 20     Temp 12/09/14 2024 99 F (37.2 C)     Temp Source 12/09/14 2024 Oral     SpO2 12/09/14 2024 100 %     Weight 12/09/14 2024 180 lb (81.647 kg)     Height 12/09/14 2024 5\' 9"  (1.753 m)     Head Cir --      Peak Flow --      Pain Score 12/09/14 2024 10     Pain Loc --      Pain Edu? --      Excl. in [place]? --    Constitutional: Alert and oriented. Well appearing and in no acute distress. Eyes: Conjunctivae are normal. PERRL. EOMI. Head: Atraumatic. Nose: No congestion/rhinnorhea. Mouth/Throat: Mucous membranes are very dry.  Oropharynx non-erythematous. Neck: No stridor.   Cardiovascular: Normal rate, regular rhythm. Grossly normal heart sounds.  Good peripheral circulation. Respiratory: Normal respiratory effort.  No retractions. Lungs CTAB. Gastrointestinal: Soft and moderately tender more mid abdomen, and slightly along the right flank area. No distention. No abdominal bruits. No CVA tenderness. Old midline surgical scar in the mid  abdomen. On auscultation for about 30 seconds I do not hear any gastric sounds. No rebound or guarding pain. Musculoskeletal: No lower extremity tenderness nor edema.  No joint effusions. Neurologic:  Normal speech and language. No gross focal neurologic deficits are appreciated. Skin:  Skin is warm, dry and intact. No rash noted. Psychiatric: Mood and affect are normal. Speech and behavior are normal.  ____________________________________________   LABS (all labs ordered are listed, but only abnormal results are displayed)  Labs Reviewed  LIPASE, BLOOD - Abnormal; Notable for the following:    Lipase 15 (*)    All other components within normal limits  COMPREHENSIVE METABOLIC PANEL - Abnormal; Notable for the following:    Potassium 3.4 (*)    Chloride  99 (*)    Glucose, Bld 134 (*)    Total Protein 8.6 (*)    Total Bilirubin 1.3 (*)    All other components within normal limits  CBC - Abnormal; Notable for the following:    WBC 13.7 (*)    All other components within normal limits  URINALYSIS COMPLETEWITH MICROSCOPIC ([facility] ONLY)   ____________________________________________  EKG   ____________________________________________  RADIOLOGY  CT report pending at time of sign out to Dr. Beather Arbour ____________________________________________   PROCEDURES  Procedure(s) performed: None  Critical Care performed: No  ____________________________________________   INITIAL IMPRESSION / ASSESSMENT AND PLAN / ED COURSE  Pertinent labs & imaging results that were available during my care of the patient were reviewed by me and considered in my medical decision making (see chart for details).  Patient presents with nausea and vomiting and abdominal pain. Patient has extensive history of abdominal issues including previous surgery and revision. He is followed closely by yearly surgical. I discussed the case and care with Dr. Bronson Ing and placed a consult at 10 PM, Dr. Pat Patrick advises to  obtain CT imaging and then will see in consult this evening.  We'll continue the patient with pain control, antiemetics, hydration, and obtain CT imaging. I am concerned about acute intra-abdominal pathology including obstruction, ileus, perforation, recurrent infection etc.  Labs indicate acute leukocytosis.  ----------------------------------------- 10:55 PM on 12/09/2014 ----------------------------------------- Patient resting comfortably. He reports pain is much improved. Dr. Pat Patrick currently seeing the patient in consult. Ongoing care and disposition assigned to Dr. Beather Arbour, follow-up on surgical consult. If Dr. Pat Patrick advises the patient could be discharged, I would agree with this plan of the patient's pain and nausea are controlled.  ____________________________________________   FINAL CLINICAL IMPRESSION(S) / ED DIAGNOSES  Final diagnoses:  Epigastric pain  Generalized abdominal pain      Delman Kitten, MD 12/09/14 2340

## 2014-12-10 DIAGNOSIS — R112 Nausea with vomiting, unspecified: Secondary | ICD-10-CM | POA: Diagnosis present

## 2014-12-10 DIAGNOSIS — G43A1 Cyclical vomiting, intractable: Secondary | ICD-10-CM | POA: Diagnosis not present

## 2014-12-10 LAB — CBC
HCT: 38.4 % — ABNORMAL LOW (ref 40.0–52.0)
HEMOGLOBIN: 13.1 g/dL (ref 13.0–18.0)
MCH: 28.8 pg (ref 26.0–34.0)
MCHC: 34 g/dL (ref 32.0–36.0)
MCV: 84.7 fL (ref 80.0–100.0)
Platelets: 258 10*3/uL (ref 150–440)
RBC: 4.53 MIL/uL (ref 4.40–5.90)
RDW: 14 % (ref 11.5–14.5)
WBC: 13.6 10*3/uL — ABNORMAL HIGH (ref 3.8–10.6)

## 2014-12-10 LAB — BASIC METABOLIC PANEL
ANION GAP: 6 (ref 5–15)
BUN: 12 mg/dL (ref 6–20)
CALCIUM: 9.1 mg/dL (ref 8.9–10.3)
CHLORIDE: 103 mmol/L (ref 101–111)
CO2: 28 mmol/L (ref 22–32)
Creatinine, Ser: 0.83 mg/dL (ref 0.61–1.24)
GFR calc non Af Amer: >60 mL/min (ref >60)
Glucose, Bld: 115 mg/dL — ABNORMAL HIGH (ref 65–99)
Potassium: 3.3 mmol/L — ABNORMAL LOW (ref 3.5–5.1)
Sodium: 137 mmol/L (ref 135–145)

## 2014-12-10 MED ORDER — HYDROCODONE-ACETAMINOPHEN 5-325 MG PO TABS
2.0000 | ORAL_TABLET | ORAL | Status: DC | PRN
  Administered 2014-12-10: 2 via ORAL
  Filled 2014-12-10: qty 2

## 2014-12-10 MED ORDER — ENOXAPARIN SODIUM 40 MG/0.4ML ~~LOC~~ SOLN
40.0000 mg | SUBCUTANEOUS | Status: DC
  Administered 2014-12-10 – 2014-12-11 (×3): 40 mg via SUBCUTANEOUS
  Filled 2014-12-10 (×4): qty 0.4

## 2014-12-10 MED ORDER — KCL IN DEXTROSE-NACL 20-5-0.45 MEQ/L-%-% IV SOLN
INTRAVENOUS | Status: DC
  Administered 2014-12-10: 1 mL via INTRAVENOUS
  Administered 2014-12-10 – 2014-12-12 (×4): via INTRAVENOUS
  Filled 2014-12-10 (×7): qty 1000

## 2014-12-10 MED ORDER — HYDROCHLOROTHIAZIDE 25 MG PO TABS
25.0000 mg | ORAL_TABLET | Freq: Every day | ORAL | Status: DC
  Administered 2014-12-10 – 2014-12-12 (×3): 25 mg via ORAL
  Filled 2014-12-10 (×3): qty 1

## 2014-12-10 MED ORDER — LOSARTAN POTASSIUM 50 MG PO TABS
100.0000 mg | ORAL_TABLET | Freq: Every day | ORAL | Status: DC
  Administered 2014-12-10 – 2014-12-12 (×3): 100 mg via ORAL
  Filled 2014-12-10 (×3): qty 2

## 2014-12-10 MED ORDER — LOSARTAN POTASSIUM-HCTZ 100-25 MG PO TABS: 1.0000 | ORAL_TABLET | Freq: Every day | ORAL | Status: DC

## 2014-12-10 MED ORDER — HYDROMORPHONE HCL 1 MG/ML IJ SOLN
1.0000 mg | INTRAMUSCULAR | Status: DC | PRN
Start: 2014-12-10 — End: 2014-12-12
  Administered 2014-12-10 – 2014-12-12 (×10): 1 mg via INTRAVENOUS
  Filled 2014-12-10 (×10): qty 1

## 2014-12-10 MED ORDER — FAMOTIDINE 20 MG PO TABS
20.0000 mg | ORAL_TABLET | Freq: Every day | ORAL | Status: DC
Start: 2014-12-10 — End: 2014-12-12
  Administered 2014-12-10 – 2014-12-12 (×3): 20 mg via ORAL
  Filled 2014-12-10 (×3): qty 1

## 2014-12-10 MED ORDER — IBUPROFEN 600 MG PO TABS: 600.0000 mg | ORAL_TABLET | Freq: Four times a day (QID) | ORAL | Status: DC | PRN

## 2014-12-10 MED ORDER — PROMETHAZINE HCL 6.25 MG/5ML PO SYRP
25.0000 mg | ORAL_SOLUTION | Freq: Three times a day (TID) | ORAL | Status: DC | PRN
  Administered 2014-12-10 – 2014-12-12 (×5): 25 mg via ORAL
  Filled 2014-12-10 (×8): qty 20

## 2014-12-10 MED ORDER — IBUPROFEN 600 MG PO TABS
600.0000 mg | ORAL_TABLET | Freq: Three times a day (TID) | ORAL | Status: DC
  Administered 2014-12-10 – 2014-12-12 (×7): 600 mg via ORAL
  Filled 2014-12-10 (×8): qty 1

## 2014-12-10 NOTE — ED Notes (Signed)
Called to give report, told they had nursing supervisor on floor, getting room ready, will call back when ready.

## 2014-12-10 NOTE — H&P (Signed)
Edward Harris is a 39 y.o. male  who underwent a sigmoid colectomy for chronic sigmoid diverticulitis in July of this year. He has been readmitted for intractable nausea and vomiting being discharged approximately 3 weeks ago.  HPI: He returns with nausea and vomiting for the last 48 hours. He denies any abdominal pain. He been feeling pretty well over the last couple of weeks. On Friday 8 some beef and then began to develop some nausea. He's been unable to stop vomiting since Friday he denies any fever but has had some chills and diaphoresis with this vomiting. He did not have any abdominal pain until today when she relates to his abdominal cramping from so much vomiting. He's been having normal bowel function. He's been unable to tolerate any liquid or solid food.  His presentation emergency room reveal slightly elevated white blood cell count of 13,000 up from 7000 and discharge. CT scan was repeated which ex shows improvement from his last scan several weeks ago. There is minimal inflammatory change remaining around the site of this anastomosis. No other significant abnormalities were identified. His potassium is slightly depressed at 3.4 but his creatinine is normal.  Past Medical History  Diagnosis Date  . Diverticulitis   . Hypertension   . IBS (irritable bowel syndrome)   . Nephrolithiasis   . Arthritis     ankles  . Wears contact lenses    Past Surgical History  Procedure Laterality Date  . Inguinal hernia repair Right   . Kidney stone surgery      extraction/lithotripsy  . Esophagogastroduodenoscopy (egd) with propofol N/A 10/03/2014    Procedure: ESOPHAGOGASTRODUODENOSCOPY (EGD) WITH PROPOFOL;  Surgeon: Lucilla Lame, MD;  Location: [place];  Service: Endoscopy;  Laterality: N/A;  . Colonoscopy with propofol N/A 10/03/2014    Procedure: COLONOSCOPY WITH PROPOFOL;  Surgeon: Lucilla Lame, MD;  Location: [place];  Service: Endoscopy;  Laterality: N/A;  .  Laparoscopic sigmoid colectomy N/A 10/19/2014    Procedure: LAPAROSCOPIC ASSISTED  SIGMOID COLECTOMY;  Surgeon: Marlyce Huge, MD;  Location: [facility];  Service: General;  Laterality: N/A;  . Colon resection  10/30/2014   Social History   Social History  . Marital Status: Married    Spouse Name: N/A  . Number of Children: 4  . Years of Education: N/A   Occupational History  . Deliver sheet rock    Social History Main Topics  . Smoking status: Former Research scientist (life sciences)  . Smokeless tobacco: Never Used  . Alcohol Use: 0.0 oz/week    0 Standard drinks or equivalent per week     Comment: "occassional" couple drinks per month  . Drug Use: No  . Sexual Activity: Not Asked   Other Topics Concern  . None   Social History Narrative   Lives with wife & children     Review of Systems  Constitutional: Positive for chills, malaise/fatigue and diaphoresis. Negative for fever.  HENT: Negative.   Eyes: Negative.   Respiratory: Negative.   Cardiovascular: Negative.   Gastrointestinal: Positive for nausea and vomiting.  Genitourinary: Negative.   Musculoskeletal: Negative.   Skin: Negative.   Neurological: Negative.   Psychiatric/Behavioral: Negative.      PHYSICAL EXAM: BP 176/116 mmHg  Pulse 86  Temp(Src) 99 F (37.2 C) (Oral)  Resp 20  Ht 5\' 9"  (1.753 m)  Wt 180 lb (81.647 kg)  BMI 26.57 kg/m2  SpO2 100%  Physical Exam  Constitutional: He is oriented to person, place, and time. He  appears well-developed and well-nourished. No distress.  HENT:  Head: Normocephalic and atraumatic.  Eyes: EOM are normal. Pupils are equal, round, and reactive to light.  Neck: Normal range of motion.  Cardiovascular: Regular rhythm and normal heart sounds.   Pulmonary/Chest: Effort normal and breath sounds normal.  Abdominal: Soft. Bowel sounds are normal. He exhibits no distension. There is no tenderness. There is no guarding.  Musculoskeletal: Normal range of motion. He exhibits no edema or  tenderness.  Neurological: He is alert and oriented to person, place, and time.  Skin: Skin is warm and dry.  Psychiatric: His behavior is normal. Judgment normal.   His abdomen is soft his wound is well-healed. He does not have any abdominal tenderness no rebound or guarding and active bowel sounds.  Impression/Plan: I have independently reviewed his CT scan. I do not have an answer course current symptoms. He does appear to have some secondary gain with his chronic illnesses he was set up to return to work later this week. He now relates he does not feel he can call home on oral medication and should be hospitalized for IV medication. I don't have the pathology which is apparent at this point but he does have intractable nausea and vomiting. We will plan to admit him to the hospital start him on some by mouth Phenergan and rehydrate him. We will delay his return to work since he clearly is unable to tolerate normal activity at the present time. This plan has been discussed with the patient and is acceptable to him.   [name], MD  12/10/2014, 12:06 AM

## 2014-12-10 NOTE — Progress Notes (Signed)
Patient ID: Edward Harris, male   DOB: Aug 12, 1975, 39 y.o.   MRN: [id number]   Patient well-known to our service. CT scans were personally reviewed and discussed the patient's care with Dr. Pat Patrick from whom I am assuming his care.  He is feeling somewhat better. He is tolerating a clear liquid diet. He does not want to advance at this time.  Filed Vitals:   12/10/14 0100 12/10/14 0216 12/10/14 0741 12/10/14 0751  BP: 153/107 168/110  144/103  Pulse: 72 74 68 66  Temp:  98.6 F (37 C) 97.8 F (36.6 C) 98.5 F (36.9 C)  TempSrc:  Oral Oral Oral  Resp:  16    Height:      Weight:      SpO2: 97% 100% 100% 100%    Abdominal examination is soft and nontender. Incision is healed nicely.  CBC Latest Ref Rng 12/10/2014 12/09/2014 11/17/2014  WBC 3.8 - 10.6 K/uL 13.6(H) 13.7(H) 6.5  Hemoglobin 13.0 - 18.0 g/dL 13.1 15.2 11.8(L)  Hematocrit 40.0 - 52.0 % 38.4(L) 44.7 35.7(L)  Platelets 150 - 440 K/uL 258 297 250    BMP Latest Ref Rng 12/10/2014 12/09/2014 11/15/2014  Glucose 65 - 99 mg/dL 115(H) 134(H) 95  BUN 6 - 20 mg/dL 12 15 13   Creatinine 0.61 - 1.24 mg/dL 0.83 0.96 1.02  Sodium 135 - 145 mmol/L 137 137 137  Potassium 3.5 - 5.1 mmol/L 3.3(L) 3.4(L) 3.5  Chloride 101 - 111 mmol/L 103 99(L) 100(L)  CO2 22 - 32 mmol/L 28 27 26   Calcium 8.9 - 10.3 mg/dL 9.1 10.2 9.2    Impression nausea and vomiting 7 weeks status post sigmoid colectomy of unclear etiology. I do not see anything on CT scan that warrants surgical intervention.  Plan continue antibiotics and hydration 24 hours and advance diet as tolerated. The patient is in agreement with this plan.

## 2014-12-11 DIAGNOSIS — R1084 Generalized abdominal pain: Secondary | ICD-10-CM

## 2014-12-11 MED ORDER — OXYCODONE-ACETAMINOPHEN 5-325 MG PO TABS: 2.0000 | ORAL_TABLET | ORAL | Status: DC | PRN

## 2014-12-11 NOTE — Progress Notes (Signed)
Initial Nutrition Assessment     INTERVENTION:  Nutrition Supplement Therapy: If unable to progress diet recommend adding boost breeze TID for added nutrition   NUTRITION DIAGNOSIS:   Inadequate oral intake related to altered GI function as evidenced by NPO status, other (see comment) (Clear liquids).    GOAL:   Patient will meet greater than or equal to 90% of their needs    MONITOR:    (Energy intake, Digestive system, )  REASON FOR ASSESSMENT:   Malnutrition Screening Tool    ASSESSMENT:      Pt admitted with intractable nausea and vomiting.  Post op 7 weeks sigmoid colectomy.  Noted per MD noted CT negative  Past Medical History  Diagnosis Date  . Diverticulitis   . Hypertension   . IBS (irritable bowel syndrome)   . Nephrolithiasis   . Arthritis     ankles  . Wears contact lenses     Current Nutrition: tolerating liquids this am, ready for solid foods  Food/Nutrition-Related History: pt reports has been eating more bland foods items, less fatty foods. Did eat roast beef sandwich prior to admission, first red meat since admission  Pt reports appetite has been better prior to admission except for 3 days prior to admission decreased intake secondary to nausea, vomiting  Medications: D5 1/2 NS at 7ml/hr  Electrolyte/Renal Profile and Glucose Profile:   Recent Labs Lab 12/09/14 2031 12/10/14 0432  NA 137 137  K 3.4* 3.3*  CL 99* 103  CO2 27 28  BUN 15 12  CREATININE 0.96 0.83  CALCIUM 10.2 9.1  GLUCOSE 134* 115*   Protein Profile:   Recent Labs Lab 12/09/14 2031  ALBUMIN 5.0    Gastrointestinal Profile: abdomen soft, bowel sounds active, nontender Last BM:9/02   Nutrition-Focused Physical Exam Findings: Nutrition-Focused physical exam completed. Findings are WDL for fat depletion, muscle depletion, and edema.     Weight Change: Pt reports stable even possible wt gain prior to admission.  Per wt encounters 18% weight loss in the  last 3 months    Diet Order:  Diet clear liquid Room service appropriate?: Yes  Skin:   reviewed   Height:   Ht Readings from Last 1 Encounters:  12/09/14 5\' 9"  (1.753 m)    Weight:   Wt Readings from Last 1 Encounters:  12/09/14 180 lb (81.647 kg)     BMI:  Body mass index is 26.57 kg/(m^2).  Estimated Nutritional Needs:   Kcal:  BEE 1730 kcals (IF 1.0-1.2, aF 1.3) 4132-4401 kcals/d  Protein:  (1.2-1.5 g/kg) 98-123 g/d  Fluid:  (30-68ml/kg) 2460-2810ml/d  EDUCATION NEEDS:   No education needs identified at this time  [place]. Zenia Resides, [place], [place] (pager)

## 2014-12-11 NOTE — Progress Notes (Signed)
Patient ID: Edward Harris, male   DOB: 05-05-1975, 39 y.o.   MRN: [id number]   Surgery  He's had no further nausea or vomiting however he is requiring orally dissolving Zofran. He feels the IV Dilaudid helps relax his abdomen and while some sleep however the Norco is not helping.  He is interested in regular diet today.  He has had a bowel movement since our last visit.   Filed Vitals:   12/10/14 0751 12/10/14 1523 12/10/14 2330 12/11/14 0802  BP: 144/103 128/84 122/81 125/83  Pulse: 66 66 57 65  Temp: 98.5 F (36.9 C) 98.2 F (36.8 C) 98.7 F (37.1 C) 98 F (36.7 C)  TempSrc: Oral Oral Oral Oral  Resp:   18 18  Height:      Weight:      SpO2: 100% 99% 100% 100%    Physical examination his abdomen is benign.  Impression persistent nausea unclear etiology.  Plan we will discontinue his SCDs and Ted's. We will switch him from [place] to [place]. Advance his diet. Possibly home tomorrow. I'll discuss with Dr. Rexene Edison his primary surgeon tomorrow.

## 2014-12-12 ENCOUNTER — Ambulatory Visit: Payer: Managed Care, Other (non HMO) | Admitting: Surgery

## 2014-12-12 ENCOUNTER — Telehealth: Payer: Self-pay | Admitting: Surgery

## 2014-12-12 MED ORDER — LOSARTAN POTASSIUM 100 MG PO TABS: 100.0000 mg | ORAL_TABLET | Freq: Every day | ORAL | Status: DC

## 2014-12-12 MED ORDER — OXYCODONE-ACETAMINOPHEN 5-325 MG PO TABS: 2.0000 | ORAL_TABLET | ORAL | Status: DC | PRN

## 2014-12-12 MED ORDER — ONDANSETRON 4 MG PO TBDP: 4.0000 mg | ORAL_TABLET | Freq: Four times a day (QID) | ORAL | Status: DC | PRN

## 2014-12-12 MED ORDER — HYDROCHLOROTHIAZIDE 25 MG PO TABS: 25.0000 mg | ORAL_TABLET | Freq: Every day | ORAL | Status: DC

## 2014-12-12 MED ORDER — PROMETHAZINE HCL 6.25 MG/5ML PO SYRP: 12.5000 mg | ORAL_SOLUTION | Freq: Three times a day (TID) | ORAL | Status: DC | PRN

## 2014-12-12 NOTE — Progress Notes (Signed)
Order for d/c.  Teaching complete, summary, Rxs, f/u appt reviewed.  All questions answered; pt verbalized understanding.  To call for d/c via wheelchair once his ride arrives to hospital.

## 2014-12-12 NOTE — Discharge Instructions (Signed)

## 2014-12-12 NOTE — Telephone Encounter (Signed)
Liquid instead of pills please. He said Dr Burt Knack was going to call that in for him at his appointment this morning

## 2014-12-12 NOTE — Discharge Summary (Signed)
Physician Discharge Summary  Patient ID: Edward Harris MRN: [id number] DOB/AGE: 38-Jun-1977 38 y.o.  Admit date: 12/09/2014 Discharge date: 12/12/2014  Admission Diagnoses: Intractable nausea and vomiting.  Discharge Diagnoses:  Active Problems:   Intractable nausea and vomiting   Generalized abdominal pain   Discharged Condition: Improved  Hospital Course: The patient was admitted with abdominal pain and intractable nausea and vomiting. He was rehydrated placed on both intravenous and orally dissolving Zofran and Phenergan. He was able to tolerate a regular diet. CT scan was unremarkable. Patient was eventually able to go home on a regular diet on hospital day #2 with follow-up as an outpatient with Dr. Rexene Edison.   Discharge Exam: Blood pressure 129/81, pulse 58, temperature 98.1 F (36.7 C), temperature source Oral, resp. rate 16, height 5\' 9"  (1.753 m), weight 180 lb (81.647 kg), SpO2 100 %. His abdomen was soft and nontender incisions healed nicely.  Disposition: 01-Home or Self Care     Medication List    STOP taking these medications        cephALEXin 500 MG capsule  Commonly known as:  KEFLEX     clindamycin 300 MG capsule  Commonly known as:  CLEOCIN     HYDROcodone-acetaminophen 5-325 MG per tablet  Commonly known as:  NORCO/VICODIN      TAKE these medications        hydrochlorothiazide 25 MG tablet  Commonly known as:  HYDRODIURIL  Take 1 tablet (25 mg total) by mouth daily.     ibuprofen 600 MG tablet  Commonly known as:  ADVIL,MOTRIN  Take 1 tablet (600 mg total) by mouth every 8 (eight) hours.     losartan 100 MG tablet  Commonly known as:  COZAAR  Take 1 tablet (100 mg total) by mouth daily.     losartan-hydrochlorothiazide 100-25 MG per tablet  Commonly known as:  HYZAAR  Take 1 tablet by mouth daily.     ondansetron 4 MG disintegrating tablet  Commonly known as:  ZOFRAN-ODT  Take 1 tablet (4 mg total) by mouth every 6 (six) hours as  needed for nausea.     oxyCODONE-acetaminophen 5-325 MG per tablet  Commonly known as:  PERCOCET/ROXICET  Take 2 tablets by mouth every 4 (four) hours as needed for moderate pain.           Follow-up Information    Follow up with Phoebe Perch, MD In 1 week.   Specialty:  Surgery   Contact information:   [address] [place] [address] [phone]       Signed: Sherri Rad 12/12/2014, 8:55 AM

## 2014-12-12 NOTE — Telephone Encounter (Signed)
error 

## 2014-12-12 NOTE — Telephone Encounter (Signed)
Call returned to patient at this time. No answer. Unable to leave message as patient's voicemail is not set-up.  Call made to Wife, Edward Harris. She is requesting Phenergan liquid instead of Zofran as this worked better in hospital.  Spoke with Dr. Rexene Edison at this time. Verbal orders given for Phenergan 12.5mg  syrup Q8H PRN.  Wife disconnected prior to me being able to get back to phone.  Sent medications to pharmacy.   Called wife back to let her know this was sent to pharmacy at this time. No answer. Left voicemail stating what had been done.

## 2014-12-18 ENCOUNTER — Ambulatory Visit: Payer: Managed Care, Other (non HMO) | Admitting: Surgery

## 2014-12-19 ENCOUNTER — Ambulatory Visit (INDEPENDENT_AMBULATORY_CARE_PROVIDER_SITE_OTHER): Payer: Managed Care, Other (non HMO) | Admitting: Surgery

## 2014-12-19 ENCOUNTER — Encounter: Payer: Self-pay | Admitting: Surgery

## 2014-12-19 ENCOUNTER — Other Ambulatory Visit: Payer: Self-pay | Admitting: *Deleted

## 2014-12-19 VITALS — BP 148/98 | HR 88 | Temp 98.0°F | Ht 69.0 in | Wt 210.0 lb

## 2014-12-19 DIAGNOSIS — K5732 Diverticulitis of large intestine without perforation or abscess without bleeding: Secondary | ICD-10-CM

## 2014-12-19 NOTE — Progress Notes (Signed)
This patient who is readmitted to the hospital last week for nausea and vomiting. This followed a laparoscopic sigmoid colectomy for diverticulitis. Currently his nausea is very minimally is not vomited is tolerating a diet and having normal bowel movements no fevers or chills. He is not requesting any additional medications at this time. 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  Abdomen is soft and nontender wound is well healed without erythema or drainage nondistended nontympanitic.  Patient doing very well the etiology of his nausea and vomiting has not been identified yet. Currently he has resolved most if not all of those symptoms but remains slightly nauseated. I would recommend him see Dr. Rexene Edison in the near future for follow-up and then returning to work when able.

## 2014-12-19 NOTE — Patient Instructions (Signed)
Follow up in one week (12/26/2014) with Dr Rexene Edison in the [facility] office.

## 2014-12-26 ENCOUNTER — Ambulatory Visit (INDEPENDENT_AMBULATORY_CARE_PROVIDER_SITE_OTHER): Payer: Managed Care, Other (non HMO) | Admitting: Surgery

## 2014-12-26 ENCOUNTER — Encounter: Payer: Self-pay | Admitting: Surgery

## 2014-12-26 VITALS — BP 146/94 | HR 79 | Temp 98.4°F | Ht 69.0 in | Wt 215.0 lb

## 2014-12-26 DIAGNOSIS — Z09 Encounter for follow-up examination after completed treatment for conditions other than malignant neoplasm: Secondary | ICD-10-CM

## 2014-12-26 NOTE — Progress Notes (Signed)
Surgery Progress Note  S: No pain.  No nausea.  Tolerating diet.  Having good BM. O:Blood pressure 146/94, pulse 79, temperature 98.4 F (36.9 C), temperature source Oral, height 5\' 9"  (1.753 m), weight 215 lb (97.523 kg). GEN: NAD/A&Ox3 ABD: soft, min tender, nondistended, incision c/d/i  A/P 39 yo s/p sigmoid colectomy with ? Postop leak, doing well - work as tolerated - f/u prn.

## 2014-12-26 NOTE — Patient Instructions (Signed)
Okay to return to light duty as tolerated on 9/26.  Fax any information for insurance at [phone]  Follow up with me when you feel ready for regular duty

## 2015-01-10 ENCOUNTER — Telehealth: Payer: Self-pay | Admitting: Surgery

## 2015-01-10 NOTE — Telephone Encounter (Signed)
Letter sent to employer stating that patient may return to work with no restrictions on 01/14/15. Patient call and informed that letter has been sent.

## 2015-01-10 NOTE — Telephone Encounter (Signed)
Patient called and after this week he will be off his two week light duty, he needs a letter faxed to his job saying as of next week he is released and able to go to regular duty [phone], Please call once it has been done

## 2015-01-19 DIAGNOSIS — K228 Other specified diseases of esophagus: Secondary | ICD-10-CM | POA: Insufficient documentation

## 2015-01-19 DIAGNOSIS — K2289 Other specified disease of esophagus: Secondary | ICD-10-CM | POA: Insufficient documentation

## 2015-02-01 ENCOUNTER — Telehealth: Payer: Self-pay

## 2015-02-01 ENCOUNTER — Emergency Department: Payer: Managed Care, Other (non HMO)

## 2015-02-01 ENCOUNTER — Inpatient Hospital Stay
Admission: EM | Admit: 2015-02-01 | Discharge: 2015-02-04 | DRG: 392 | Disposition: A | Discharge status: Home / Self Care | Payer: Managed Care, Other (non HMO) | Attending: General Surgery | Admitting: General Surgery

## 2015-02-01 ENCOUNTER — Encounter: Payer: Self-pay | Admitting: Emergency Medicine

## 2015-02-01 DIAGNOSIS — Z9889 Other specified postprocedural states: Secondary | ICD-10-CM | POA: Diagnosis not present

## 2015-02-01 DIAGNOSIS — I10 Essential (primary) hypertension: Secondary | ICD-10-CM | POA: Diagnosis present

## 2015-02-01 DIAGNOSIS — Z79899 Other long term (current) drug therapy: Secondary | ICD-10-CM | POA: Diagnosis not present

## 2015-02-01 DIAGNOSIS — K5900 Constipation, unspecified: Secondary | ICD-10-CM | POA: Diagnosis not present

## 2015-02-01 DIAGNOSIS — Z888 Allergy status to other drugs, medicaments and biological substances status: Secondary | ICD-10-CM

## 2015-02-01 DIAGNOSIS — R112 Nausea with vomiting, unspecified: Secondary | ICD-10-CM | POA: Diagnosis present

## 2015-02-01 DIAGNOSIS — Z79891 Long term (current) use of opiate analgesic: Secondary | ICD-10-CM

## 2015-02-01 DIAGNOSIS — Z87891 Personal history of nicotine dependence: Secondary | ICD-10-CM | POA: Diagnosis not present

## 2015-02-01 DIAGNOSIS — R1110 Vomiting, unspecified: Secondary | ICD-10-CM | POA: Insufficient documentation

## 2015-02-01 DIAGNOSIS — R1032 Left lower quadrant pain: Secondary | ICD-10-CM | POA: Diagnosis not present

## 2015-02-01 DIAGNOSIS — M13879 Other specified arthritis, unspecified ankle and foot: Secondary | ICD-10-CM | POA: Diagnosis present

## 2015-02-01 DIAGNOSIS — R109 Unspecified abdominal pain: Secondary | ICD-10-CM

## 2015-02-01 DIAGNOSIS — Z9049 Acquired absence of other specified parts of digestive tract: Secondary | ICD-10-CM

## 2015-02-01 LAB — URINALYSIS COMPLETE WITH MICROSCOPIC (ARMC ONLY)
BACTERIA UA: NONE SEEN
BILIRUBIN URINE: NEGATIVE
GLUCOSE, UA: NEGATIVE mg/dL
HGB URINE DIPSTICK: NEGATIVE
LEUKOCYTES UA: NEGATIVE
NITRITE: NEGATIVE
Protein, ur: NEGATIVE mg/dL
SPECIFIC GRAVITY, URINE: 1.013 (ref 1.005–1.030)
pH: 5 (ref 5.0–8.0)

## 2015-02-01 LAB — CBC
HCT: 45.2 % (ref 40.0–52.0)
Hemoglobin: 15.1 g/dL (ref 13.0–18.0)
MCH: 28.5 pg (ref 26.0–34.0)
MCHC: 33.4 g/dL (ref 32.0–36.0)
MCV: 85.5 fL (ref 80.0–100.0)
PLATELETS: 309 10*3/uL (ref 150–440)
RBC: 5.29 MIL/uL (ref 4.40–5.90)
RDW: 13.4 % (ref 11.5–14.5)
WBC: 9.9 10*3/uL (ref 3.8–10.6)

## 2015-02-01 LAB — COMPREHENSIVE METABOLIC PANEL
ALK PHOS: 79 U/L (ref 38–126)
ALT: 32 U/L (ref 17–63)
AST: 29 U/L (ref 15–41)
Albumin: 4.7 g/dL (ref 3.5–5.0)
Anion gap: 9 (ref 5–15)
BILIRUBIN TOTAL: 1.5 mg/dL — AB (ref 0.3–1.2)
BUN: 15 mg/dL (ref 6–20)
CALCIUM: 9.4 mg/dL (ref 8.9–10.3)
CHLORIDE: 102 mmol/L (ref 101–111)
CO2: 26 mmol/L (ref 22–32)
CREATININE: 1.01 mg/dL (ref 0.61–1.24)
Glucose, Bld: 146 mg/dL — ABNORMAL HIGH (ref 65–99)
Potassium: 3.4 mmol/L — ABNORMAL LOW (ref 3.5–5.1)
Sodium: 137 mmol/L (ref 135–145)
TOTAL PROTEIN: 7.8 g/dL (ref 6.5–8.1)

## 2015-02-01 LAB — TROPONIN I: Troponin I: <0.03 ng/mL (ref <0.031)

## 2015-02-01 LAB — LIPASE, BLOOD: LIPASE: 41 U/L (ref 11–51)

## 2015-02-01 MED ORDER — IOHEXOL 300 MG/ML  SOLN
100.0000 mL | Freq: Once | INTRAMUSCULAR | Status: AC | PRN
  Administered 2015-02-01: 100 mL via INTRAVENOUS

## 2015-02-01 MED ORDER — HYDRALAZINE HCL 20 MG/ML IJ SOLN: 10.0000 mg | INTRAMUSCULAR | Status: DC | PRN

## 2015-02-01 MED ORDER — SODIUM CHLORIDE 0.9 % IV BOLUS (SEPSIS)
1000.0000 mL | Freq: Once | INTRAVENOUS | Status: AC
  Administered 2015-02-01: 1000 mL via INTRAVENOUS

## 2015-02-01 MED ORDER — PROMETHAZINE HCL 25 MG/ML IJ SOLN
25.0000 mg | Freq: Once | INTRAMUSCULAR | Status: AC
  Administered 2015-02-01: 25 mg via INTRAVENOUS

## 2015-02-01 MED ORDER — PROMETHAZINE HCL 25 MG/ML IJ SOLN
25.0000 mg | Freq: Once | INTRAMUSCULAR | Status: AC
  Administered 2015-02-01: 25 mg via INTRAVENOUS
  Filled 2015-02-01: qty 1

## 2015-02-01 MED ORDER — MORPHINE SULFATE (PF) 4 MG/ML IV SOLN
4.0000 mg | INTRAVENOUS | Status: DC | PRN
  Administered 2015-02-01 – 2015-02-03 (×8): 4 mg via INTRAVENOUS
  Filled 2015-02-01 (×8): qty 1

## 2015-02-01 MED ORDER — ONDANSETRON HCL 4 MG/2ML IJ SOLN
4.0000 mg | Freq: Four times a day (QID) | INTRAMUSCULAR | Status: DC | PRN
  Administered 2015-02-01 – 2015-02-03 (×5): 4 mg via INTRAVENOUS
  Filled 2015-02-01 (×5): qty 2

## 2015-02-01 MED ORDER — HYDROMORPHONE HCL 1 MG/ML IJ SOLN
INTRAMUSCULAR | Status: AC
  Administered 2015-02-01: 1 mg via INTRAVENOUS
  Filled 2015-02-01: qty 1

## 2015-02-01 MED ORDER — HYDROMORPHONE HCL 1 MG/ML IJ SOLN
0.5000 mg | Freq: Once | INTRAMUSCULAR | Status: AC
  Administered 2015-02-01: 0.5 mg via INTRAVENOUS

## 2015-02-01 MED ORDER — IOHEXOL 240 MG/ML SOLN
25.0000 mL | Freq: Once | INTRAMUSCULAR | Status: AC | PRN
  Administered 2015-02-01: 25 mL via ORAL

## 2015-02-01 MED ORDER — DIPHENHYDRAMINE HCL 50 MG/ML IJ SOLN: 25.0000 mg | Freq: Four times a day (QID) | INTRAMUSCULAR | Status: DC | PRN

## 2015-02-01 MED ORDER — HYDROMORPHONE HCL 1 MG/ML IJ SOLN
1.0000 mg | Freq: Once | INTRAMUSCULAR | Status: AC
  Administered 2015-02-01: 1 mg via INTRAVENOUS
  Filled 2015-02-01: qty 1

## 2015-02-01 MED ORDER — ENOXAPARIN SODIUM 40 MG/0.4ML ~~LOC~~ SOLN
40.0000 mg | SUBCUTANEOUS | Status: DC
  Filled 2015-02-01: qty 0.4

## 2015-02-01 MED ORDER — DIPHENHYDRAMINE HCL 25 MG PO CAPS: 25.0000 mg | ORAL_CAPSULE | Freq: Four times a day (QID) | ORAL | Status: DC | PRN

## 2015-02-01 MED ORDER — PANTOPRAZOLE SODIUM 40 MG IV SOLR
40.0000 mg | Freq: Every day | INTRAVENOUS | Status: DC
Start: 2015-02-01 — End: 2015-02-04
  Administered 2015-02-01 – 2015-02-03 (×3): 40 mg via INTRAVENOUS
  Filled 2015-02-01 (×3): qty 40

## 2015-02-01 MED ORDER — KCL IN DEXTROSE-NACL 20-5-0.45 MEQ/L-%-% IV SOLN
INTRAVENOUS | Status: DC
Start: 2015-02-01 — End: 2015-02-04
  Administered 2015-02-01 – 2015-02-04 (×8): via INTRAVENOUS
  Filled 2015-02-01 (×10): qty 1000

## 2015-02-01 MED ORDER — ONDANSETRON 4 MG PO TBDP: 4.0000 mg | ORAL_TABLET | Freq: Four times a day (QID) | ORAL | Status: DC | PRN

## 2015-02-01 MED ORDER — PROMETHAZINE HCL 25 MG/ML IJ SOLN
INTRAMUSCULAR | Status: AC
  Administered 2015-02-01: 25 mg via INTRAVENOUS
  Filled 2015-02-01: qty 1

## 2015-02-01 NOTE — ED Provider Notes (Signed)
[facility] Emergency Department Provider Note  ____________________________________________  Time seen: Approximately 2:37 PM  I have reviewed the triage vital signs and the nursing notes.   HISTORY  Chief Complaint Abdominal Pain and Emesis   HPI Edward Harris is a 39 y.o. male who complains of abdominal pain and intractable vomiting since chest today. Patient had a history of has a history of diverticulosis with partial colon resection in August of this year and re-anastomosis. He's had 5 hospitalizations since then last one was last week at [facility] where he was diagnosed with diverticulitis again. At that time he was treated with by mouth antibiotics and sent home. Patient complains of severe pain in the left lower abdomen and the vomiting as noted above patient does not have any trouble with diarrhea. Pain is severe achy and crampy in nature nothing makes it better or worse   Past Medical History  Diagnosis Date  . Diverticulitis   . Hypertension   . IBS (irritable bowel syndrome)   . Nephrolithiasis   . Arthritis     ankles  . Wears contact lenses     Patient Active Problem List   Diagnosis Date Noted  . Generalized abdominal pain   . Intractable nausea and vomiting 12/10/2014  . Nausea   . Abdominal pain 11/14/2014  . Colitis, regional ([place]) 10/31/2014  . Benign neoplasm of sigmoid colon   . Diverticulitis of large intestine without perforation or abscess without bleeding   . Nausea with vomiting   . Esophageal hemorrhage   . Gastric catarrh   . Idiopathic colitis   . Diverticulitis of colon 09/26/2014  . Non-intractable cyclical vomiting with nausea 09/26/2014  . Diverticulitis large intestine w/o perforation or abscess w/o bleeding 09/07/2014  . Diverticulitis 08/19/2014  . Ileus ([place]) 08/19/2014  . Hypertension 08/19/2014    Past Surgical History  Procedure Laterality Date  . Inguinal hernia repair Right   . Kidney stone surgery       extraction/lithotripsy  . Esophagogastroduodenoscopy (egd) with propofol N/A 10/03/2014    Procedure: ESOPHAGOGASTRODUODENOSCOPY (EGD) WITH PROPOFOL;  Surgeon: Lucilla Lame, MD;  Location: [facility];  Service: Endoscopy;  Laterality: N/A;  . Colonoscopy with propofol N/A 10/03/2014    Procedure: COLONOSCOPY WITH PROPOFOL;  Surgeon: Lucilla Lame, MD;  Location: [facility];  Service: Endoscopy;  Laterality: N/A;  . Laparoscopic sigmoid colectomy N/A 10/19/2014    Procedure: LAPAROSCOPIC ASSISTED  SIGMOID COLECTOMY;  Surgeon: Marlyce Huge, MD;  Location: [facility];  Service: General;  Laterality: N/A;  . Colon resection  10/30/2014    Current Outpatient Rx  Name  Route  Sig  Dispense  Refill  . ibuprofen (ADVIL,MOTRIN) 600 MG tablet   Oral   Take 1 tablet (600 mg total) by mouth every 8 (eight) hours.   30 tablet   0   . losartan-hydrochlorothiazide (HYZAAR) 100-25 MG per tablet   Oral   Take 1 tablet by mouth daily.         Marland Kitchen oxyCODONE-acetaminophen (PERCOCET/ROXICET) 5-325 MG per tablet   Oral   Take 2 tablets by mouth every 4 (four) hours as needed for moderate pain.   45 tablet   0   . promethazine (PHENERGAN) 6.25 MG/5ML syrup   Oral   Take 10 mLs (12.5 mg total) by mouth every 8 (eight) hours as needed for nausea or vomiting.   150 mL   0     Allergies Amlodipine  Family History  Problem Relation  Age of Onset  . Hypertension Mother   . Osteoarthritis Mother   . Heart attack Father   . Hypertension Brother   . Alzheimer's disease Maternal Aunt   . Lung cancer    . Colon cancer Maternal Grandmother 75    Social History Social History  Substance Use Topics  . Smoking status: Former Research scientist (life sciences)  . Smokeless tobacco: Never Used  . Alcohol Use: 0.0 oz/week    0 Standard drinks or equivalent per week     Comment: "occassional" couple drinks per month    Review of Systems Constitutional: No fever/chills Eyes: No visual changes. ENT: No  sore throat. Cardiovascular: Denies chest pain. Respiratory: Denies shortness of breath. Gastrointestinalsee history of present illness. Genitourinary: Negative for dysuria. Musculoskeletal: Negative for back pain. Skin: Negative for rash. Neurological: Negative for headaches, focal weakness or numbness.  10-point ROS otherwise negative.  ____________________________________________   PHYSICAL EXAM:  VITAL SIGNS: ED Triage Vitals  Enc Vitals Group     BP 02/01/15 1150 126/76 mmHg     Pulse Rate 02/01/15 1150 100     Resp 02/01/15 1150 20     Temp 02/01/15 1150 98.4 F (36.9 C)     Temp Source 02/01/15 1150 Oral     SpO2 02/01/15 1150 100 %     Weight 02/01/15 1150 210 lb (95.255 kg)     Height 02/01/15 1150 5\' 9"  (1.753 m)     Head Cir --      Peak Flow --      Pain Score 02/01/15 1150 8     Pain Loc --      Pain Edu? --      Excl. in [place]? --     Constitutional: Alert and oriented. Well appearing and in no acute distress. Eyes: Conjunctivae are normal. PERRL. EOMI. Head: Atraumatic. Nose: No congestion/rhinnorhea. Mouth/Throat: Mucous membranes are moist.  Oropharynx non-erythematous. Neck: No stridor.  Cardiovascular: Normal rate, regular rhythm. Grossly normal heart sounds.  Good peripheral circulation. Respiratory: Normal respiratory effort.  No retractions. Lungs CTAB. Gastrointestinal: Soft patient is tender to palpation percussion in the left side of the abdomen especially left lower quadrant. Bowel sounds are negative. No distention. No abdominal bruits. No CVA tenderness. Musculoskeletal: No lower extremity tenderness nor edema.  No joint effusions. Neurologic:  Normal speech and language. No gross focal neurologic deficits are appreciated. No gait instability. Skin:  Skin is warm, dry and intact. No rash noted.   ____________________________________________   LABS (all labs ordered are listed, but only abnormal results are displayed)  Labs Reviewed   COMPREHENSIVE METABOLIC PANEL - Abnormal; Notable for the following:    Potassium 3.4 (*)    Glucose, Bld 146 (*)    Total Bilirubin 1.5 (*)    All other components within normal limits  CBC  LIPASE, BLOOD  URINALYSIS COMPLETEWITH MICROSCOPIC ([facility] ONLY)  TROPONIN I   ________________________________________EKG read and interpreted by me shows normal sinus rhythm at a rate of 92 normal axis is nonspecific ST-T wave changes_______________  RADIOLOGY   ____________________________________________   PROCEDURES   ____________________________________________   INITIAL IMPRESSION / ASSESSMENT AND PLAN / ED COURSE  Pertinent labs & imaging results that were available during my care of the patient were reviewed by me and considered in my medical decision making (see chart for details).   ____________________________________________   FINAL CLINICAL IMPRESSION(S) / ED DIAGNOSES  Final diagnoses:  None   diagnosis is abdominal pain and vomiting    Eddie Dibbles  Renie Ora, MD 02/01/15 2225

## 2015-02-01 NOTE — Telephone Encounter (Signed)
Patient's wife called stating that she and [name] were at the ED because for the past two days he has been nauseated, vomiting and not being able to keep food down. She asked me to call the "on call" doctor to give him/her heads up. I told her that I would. Called and spoke to Dr. Adonis Huguenin to let him know about patient. He stated that he would go and see him.  Called Edward Harris to let her know that Dr. Adonis Huguenin would see Dr. Danny Lawless as soon as he is placed in a room.

## 2015-02-01 NOTE — ED Notes (Signed)
Pt to ed with c/o abd pain and vomiting since surgery on intestines for diverticulitis in July.  Pt reports he has had multiple infections in abd since surgery.

## 2015-02-01 NOTE — H&P (Signed)
General Surgery History and Physical  CC: Abdominal pain, nausea/vomiting  HPI: [name] is a pleasant 39 yo M with a history of pandiverticulosis and prior diverticulitis of mid/lower descending colon s/p colectomy who presents with recurrent N/V, abdominal pain.  Prior resection was limited to inflamed area due to extensiveness of diverticulosis and the extent of resection necessary to perform formal sigmoid colectomy and resection of significant part of descending colon in a patient with pandivertulosis.  Since this surgery he has been admitted multiple times for the same.  Has had stranding around anastamosis on past CT which I attributed to possible anastamotic leak vs recurrent diverticulitis.  He does have significant periods where his nausea and pain is resolved.  He recently was admitted to [facility] for similar symptoms and had CT which was concerning for recurrent diverticulitis.  Was began on IV abx with resolution of symptoms.  Was doing well for approx 6 days following discharge when he began having more nausea/vomiting and pain.  CT performed at this time shows significant stool burden downstream from his anastamosis and mild stranding adjacent to his anastamosis.  He does report BM yesterday.  No fevers/chills, night sweats, shortness of breath, cough, chest pain, dysuria/hematuria.  Active Ambulatory Problems    Diagnosis Date Noted  . Diverticulitis 08/19/2014  . Ileus ([place]) 08/19/2014  . Hypertension 08/19/2014  . Diverticulitis large intestine w/o perforation or abscess w/o bleeding 09/07/2014  . Diverticulitis of colon 09/26/2014  . Non-intractable cyclical vomiting with nausea 09/26/2014  . Benign neoplasm of sigmoid colon   . Diverticulitis of large intestine without perforation or abscess without bleeding   . Nausea with vomiting   . Esophageal hemorrhage   . Gastric catarrh   . Idiopathic colitis   . 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 Abdominal pain 11/14/2014  .  Nausea   . Intractable nausea and vomiting 12/10/2014  . Generalized abdominal pain    Resolved Ambulatory Problems    Diagnosis Date Noted  . No Resolved Ambulatory Problems   Past Medical History  Diagnosis Date  . IBS (irritable bowel syndrome)   . Nephrolithiasis   . Arthritis   . Wears contact lenses    Past Surgical History  Procedure Laterality Date  . Inguinal hernia repair Right   . Kidney stone surgery      extraction/lithotripsy  . Esophagogastroduodenoscopy (egd) with propofol N/A 10/03/2014    Procedure: ESOPHAGOGASTRODUODENOSCOPY (EGD) WITH PROPOFOL;  Surgeon: Lucilla Lame, MD;  Location: [facility];  Service: Endoscopy;  Laterality: N/A;  . Colonoscopy with propofol N/A 10/03/2014    Procedure: COLONOSCOPY WITH PROPOFOL;  Surgeon: Lucilla Lame, MD;  Location: [facility];  Service: Endoscopy;  Laterality: N/A;  . Laparoscopic sigmoid colectomy N/A 10/19/2014    Procedure: LAPAROSCOPIC ASSISTED  SIGMOID COLECTOMY;  Surgeon: Marlyce Huge, MD;  Location: [facility];  Service: General;  Laterality: N/A;  . Colon resection  10/30/2014     Medication List    ASK your doctor about these medications        losartan-hydrochlorothiazide 100-25 MG tablet  Commonly known as:  HYZAAR  Take 1 tablet by mouth daily.     oxyCODONE-acetaminophen 5-325 MG tablet  Commonly known as:  PERCOCET/ROXICET  Take 2 tablets by mouth every 4 (four) hours as needed for moderate pain.     promethazine 6.25 MG/5ML syrup  Commonly known as:  PHENERGAN  Take 10 mLs (12.5 mg total) by mouth every 8 (eight) hours as needed for nausea  or vomiting.       Allergies  Allergen Reactions  . Amlodipine Other (See Comments)    Reaction:  Numbness in arms    Social History   Social History  . Marital Status: Married    Spouse Name: N/A  . Number of Children: 4  . Years of Education: N/A   Occupational History  . Deliver sheet rock    Social History Main Topics  .  Smoking status: Former Research scientist (life sciences)  . Smokeless tobacco: Never Used  . Alcohol Use: 0.0 oz/week    0 Standard drinks or equivalent per week     Comment: "occassional" couple drinks per month  . Drug Use: No  . Sexual Activity: Not on file   Other Topics Concern  . Not on file   Social History Narrative   Lives with wife & children   Family History  Problem Relation Age of Onset  . Hypertension Mother   . Osteoarthritis Mother   . Heart attack Father   . Hypertension Brother   . Alzheimer's disease Maternal Aunt   . Lung cancer    . Colon cancer Maternal Grandmother 37   ROS: Full ROS obtained, pertinent positives and negatives as above.  Blood pressure 150/95, pulse 85, temperature 98.8 F (37.1 C), temperature source Oral, resp. rate 18, height 5\' 9"  (1.753 m), weight 210 lb (95.255 kg), SpO2 100 %. GEN: NAD/A&Ox3 FACE: no obvious facial trauma, normal external nose, normal external ears EYES: no scleral icterus, no conjunctivitis HEAD: normocephalic atraumatic CV: RRR, no MRG RESP: moving air well, lungs clear ABD: soft, min tender LLQ, no distention EXT: moving all ext well, strength 5/5 NEURO: cnII-XII grossly intact, sensation intact all 4 ext  Labs: Personally reviewed, significant for  WBC 9.9 K 3.4  CT:  Personally reviewed, significant for significant stool burden in distal colon and mild/minimal stranding around anastamosis  A/P 39 yo with pandiverticulosis, possibly with episodes of recurrent diverticulitis.  Right now with significant stool burden.  Will admit for rehydration, pain and nausea control.  Would treat with enemas/laxatives to resolve constipation, which I suspect may be at least contributing to his nausea and pain.  Would not treat with abx based on normal WBC, normal VS and unremarkable CT scan.  As outpatient will refer to colorectal specialist who is more suitable to deal with his pandiverticulosis and potential for recurrent diverticulitis which may  effect many areas of his colon.

## 2015-02-02 LAB — PHOSPHORUS: Phosphorus: 3.4 mg/dL (ref 2.5–4.6)

## 2015-02-02 LAB — COMPREHENSIVE METABOLIC PANEL
ALBUMIN: 3.9 g/dL (ref 3.5–5.0)
ALT: 23 U/L (ref 17–63)
AST: 19 U/L (ref 15–41)
Alkaline Phosphatase: 66 U/L (ref 38–126)
Anion gap: 8 (ref 5–15)
BUN: 13 mg/dL (ref 6–20)
CALCIUM: 8.7 mg/dL — AB (ref 8.9–10.3)
CHLORIDE: 103 mmol/L (ref 101–111)
CO2: 28 mmol/L (ref 22–32)
Creatinine, Ser: 0.89 mg/dL (ref 0.61–1.24)
GFR calc non Af Amer: >60 mL/min (ref >60)
GLUCOSE: 104 mg/dL — AB (ref 65–99)
Potassium: 3.4 mmol/L — ABNORMAL LOW (ref 3.5–5.1)
SODIUM: 139 mmol/L (ref 135–145)
Total Bilirubin: 1 mg/dL (ref 0.3–1.2)
Total Protein: 6.6 g/dL (ref 6.5–8.1)

## 2015-02-02 LAB — CBC
HEMATOCRIT: 39.1 % — AB (ref 40.0–52.0)
HEMOGLOBIN: 12.8 g/dL — AB (ref 13.0–18.0)
MCH: 28.1 pg (ref 26.0–34.0)
MCHC: 32.7 g/dL (ref 32.0–36.0)
MCV: 85.8 fL (ref 80.0–100.0)
Platelets: 243 10*3/uL (ref 150–440)
RBC: 4.56 MIL/uL (ref 4.40–5.90)
RDW: 13.7 % (ref 11.5–14.5)
WBC: 8.5 10*3/uL (ref 3.8–10.6)

## 2015-02-02 LAB — MAGNESIUM: MAGNESIUM: 2.2 mg/dL (ref 1.7–2.4)

## 2015-02-02 MED ORDER — BISACODYL 10 MG RE SUPP
10.0000 mg | Freq: Once | RECTAL | Status: AC
  Administered 2015-02-02: 10 mg via RECTAL
  Filled 2015-02-02: qty 1

## 2015-02-02 NOTE — Progress Notes (Signed)
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 He was treated numerous times for suspected complications secondary sigmoid colectomy for diverticulitis. He denies any fevers, chills. He also denies any bowel movement recently. Having subjective nausea. No emesis since admission.  Objective: Vital signs in last 24 hours: Temp:  [97.9 F (36.6 C)-98.8 F (37.1 C)] 98 F (36.7 C) (10/29 0448) Pulse Rate:  [70-100] 70 (10/29 0448) Resp:  [10-20] 17 (10/29 0448) BP: (104-150)/(65-95) 122/79 mmHg (10/29 0448) SpO2:  [99 %-100 %] 100 % (10/29 0448) Weight:  [95.255 kg (210 lb)] 95.255 kg (210 lb) (10/28 1150) Last BM Date: 01/31/15  Intake/Output from previous day: 10/28 0701 - 10/29 0700 In: 637.5 [I.V.:637.5] Out: 300 [Urine:300] Intake/Output this shift: Total I/O In: 328 [I.V.:328] Out: -   Physical exam:  Gen.: No acute distress Chest: Clear to auscultation Heart: Regular rate and rhythm Abdomen: Soft, nondistended, minimally tender to palpation left lower quadrant.  Lab Results: CBC   Recent Labs  02/01/15 1154 02/02/15 0700  WBC 9.9 8.5  HGB 15.1 12.8*  HCT 45.2 39.1*  PLT 309 243   BMET  Recent Labs  02/01/15 1154 02/02/15 0700  NA 137 139  K 3.4* 3.4*  CL 102 103  CO2 26 28  GLUCOSE 146* 104*  BUN 15 13  CREATININE 1.01 0.89  CALCIUM 9.4 8.7*   PT/INR No results for input(s): LABPROT, INR in the last 72 hours. ABG No results for input(s): PHART, HCO3 in the last 72 hours.  Invalid input(s): PCO2, PO2  Studies/Results: Ct Abdomen Pelvis W Contrast  02/01/2015  CLINICAL DATA:  Abdominal pain, vomiting, prior bowel surgery for diverticulitis in July EXAM: CT ABDOMEN AND PELVIS WITH CONTRAST TECHNIQUE: Multidetector CT imaging of the abdomen and pelvis was performed using the standard protocol following bolus administration of intravenous contrast. CONTRAST:  171mL OMNIPAQUE  IOHEXOL 300 MG/ML  SOLN COMPARISON:  12/09/2014 FINDINGS: Lower chest: Mild dependent atelectasis in the bilateral lower lobes. Hepatobiliary: Liver is within normal limits. No suspicious/enhancing hepatic lesions. Gallbladder is unremarkable. No intrahepatic or extrahepatic ductal dilatation. Pancreas: Within normal limits. Spleen: Within normal limits. Adrenals/Urinary Tract: Adrenal glands are within normal limits. Kidneys are within normal limits.  No hydronephrosis. Bladder is mildly thick-walled although underdistended. Stomach/Bowel: Stomach is within normal limits. No evidence of bowel obstruction. Mobile cecum in the left lower abdomen. Normal appendix (series 2/ image 69). Prior distal colonic resection with anastomosis in the left mid abdomen (series 2/image 81). Extensive colonic diverticulosis. Mild residual pericolonic stranding along the descending colon (series 2/image 34), improved. Moderate left colonic stool burden. Vascular/Lymphatic: No evidence of abdominal aortic aneurysm. No suspicious abdominopelvic lymphadenopathy. Reproductive: Prostate is unremarkable. Other: No abdominopelvic ascites. Moderate fat containing left inguinal hernia (series 2/ image 86). Postsurgical changes related to prior right inguinal hernia repair. Mild fat within the right inguinal canal (series 2/ image 85). Postsurgical changes along the midline anterior abdominal wall (series 2/image 59). Musculoskeletal: Mild degenerative changes at L4-5 and L5-S1. IMPRESSION: Prior distal colonic resection with anastomosis in the left mid abdomen. Extensive colonic diverticulosis. Mild residual pericolonic stranding along the descending colon, improved. No evidence of bowel obstruction.  Normal appendix. Additional stable ancillary findings as above. Electronically Signed   By: Julian Hy M.D.   On: 02/01/2015 18:27    Anti-infectives: Anti-infectives    None      Assessment/Plan:  39 year old male with  persistent nausea and vomiting in the setting  of thought to be recurrent diverticulitis. No evidence of infection and inflammation currently. We will start with per rectal treatment of constipation. We'll follow closely.  Nyzier Boivin T. Adonis Huguenin, MD, FACS  02/02/2015

## 2015-02-02 NOTE — Progress Notes (Signed)
Initial Nutrition Assessment     INTERVENTION:   Meals and Snacks: await diet progression as medically able   NUTRITION DIAGNOSIS:   Inadequate oral intake related to acute illness as evidenced by NPO status.  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 Diverticulitis   . Hypertension   . IBS (irritable bowel syndrome)   . Nephrolithiasis   . Arthritis     ankles  . Wears contact lenses    Past Surgical History  Procedure Laterality Date  . Inguinal hernia repair Right   . Kidney stone surgery      extraction/lithotripsy  . Esophagogastroduodenoscopy (egd) with propofol N/A 10/03/2014    Procedure: ESOPHAGOGASTRODUODENOSCOPY (EGD) WITH PROPOFOL;  Surgeon: Lucilla Lame, MD;  Location: [facility];  Service: Endoscopy;  Laterality: N/A;  . Colonoscopy with propofol N/A 10/03/2014    Procedure: COLONOSCOPY WITH PROPOFOL;  Surgeon: Lucilla Lame, MD;  Location: [facility];  Service: Endoscopy;  Laterality: N/A;  . Laparoscopic sigmoid colectomy N/A 10/19/2014    Procedure: LAPAROSCOPIC ASSISTED  SIGMOID COLECTOMY;  Surgeon: Marlyce Huge, MD;  Location: [facility];  Service: General;  Laterality: N/A;  . Colon resection  10/30/2014    Diet Order:  Diet NPO time specified   Energy Intake: pt reports inability to eat anything for a few days prior to admission but before this eating well  Skin:  Reviewed, no issues   Digestive System: pt reports nausea and pain this AM, no vomitting since last night. No diarrhea, pt with constipation on bowel regimen per MD. Abdomen tender, distended but soft  Electrolyte and  Renal Profile:  Recent Labs Lab 02/01/15 1154 02/02/15 0700  BUN 15 13  CREATININE 1.01 0.89  NA 137 139  K 3.4* 3.4*  MG  --  2.2  PHOS  --  3.4   Glucose Profile: No results for input(s): GLUCAP in the last 72 hours.   Meds: D5-1/2NS at 125 ml/hr, dulcolax suppository, dilaudid, morphine, zofran, phenergan, protonix  Nutrition Focused Physical Exam: Nutrition-Focused physical exam completed. Findings are WDL for fat depletion, muscle depletion, and edema.   Height:   Ht Readings from Last 1 Encounters:  02/01/15 5\' 9"  (1.753 m)    Weight: pt reports recent wt between 210-215, pt reports when he has "acute epsiodes" his wt trends down but then trends back up when better  Wt Readings from Last 1 Encounters:  02/01/15 210 lb (95.255 kg)   Wt Readings from Last 10 Encounters:  02/01/15 210 lb (95.255 kg)  12/26/14 215 lb (97.523 kg)  12/19/14 210 lb (95.255 kg)  12/09/14 180 lb (81.647 kg)  11/14/14 200 lb (90.719 kg)  11/08/14 204 lb (92.534 kg)  10/31/14 201 lb 1.6 oz (91.218 kg)  10/19/14 214 lb (97.07 kg)  10/15/14 214 lb (97.07 kg)  10/05/14 214 lb 12.8 oz (97.433 kg)    BMI:  Body mass index is 31 kg/(m^2).  Estimated Nutritional Needs:   Kcal:  6295-2841 kcals (BEE 1632, 1.3 AF, 1.1-1.3 IF) using IBW 72.7 kg  Protein:  80-95 g (1.1-1.3 g/kg)   Fluid:  1825-2190 mL (25-30 ml/kg)   MODERATE Care Level  Kerman Passey Gibson, [place], LDN [phone] Pager

## 2015-02-03 MED ORDER — LOSARTAN POTASSIUM-HCTZ 100-25 MG PO TABS: 1.0000 | ORAL_TABLET | Freq: Every day | ORAL | Status: DC

## 2015-02-03 MED ORDER — LOSARTAN POTASSIUM 50 MG PO TABS
100.0000 mg | ORAL_TABLET | Freq: Every day | ORAL | Status: DC
  Administered 2015-02-03 – 2015-02-04 (×2): 100 mg via ORAL
  Filled 2015-02-03 (×2): qty 2

## 2015-02-03 MED ORDER — HYDROCHLOROTHIAZIDE 25 MG PO TABS
25.0000 mg | ORAL_TABLET | Freq: Every day | ORAL | Status: DC
  Administered 2015-02-03 – 2015-02-04 (×2): 25 mg via ORAL
  Filled 2015-02-03 (×2): qty 1

## 2015-02-03 MED ORDER — MORPHINE SULFATE (PF) 4 MG/ML IV SOLN
4.0000 mg | Freq: Once | INTRAVENOUS | Status: AC
  Administered 2015-02-03: 4 mg via INTRAVENOUS
  Filled 2015-02-03: qty 1

## 2015-02-03 MED ORDER — POLYETHYLENE GLYCOL 3350 17 G PO PACK
17.0000 g | PACK | Freq: Every day | ORAL | Status: DC
  Administered 2015-02-03 – 2015-02-04 (×2): 17 g via ORAL
  Filled 2015-02-03 (×2): qty 1

## 2015-02-03 MED ORDER — OXYCODONE-ACETAMINOPHEN 5-325 MG PO TABS
2.0000 | ORAL_TABLET | ORAL | Status: DC | PRN
  Administered 2015-02-03: 2 via ORAL
  Filled 2015-02-03 (×2): qty 2

## 2015-02-03 MED ORDER — PNEUMOCOCCAL VAC POLYVALENT 25 MCG/0.5ML IJ INJ: 0.5000 mL | INJECTION | INTRAMUSCULAR | Status: DC

## 2015-02-03 NOTE — Progress Notes (Signed)
CC: Abdominal pain Subjective: Patient reports having multiple bowel movements since admission. States his pain is improved. He's had some subjective nausea but is tolerated his clear liquids.  Objective: Vital signs in last 24 hours: Temp:  [97.9 F (36.6 C)-98.3 F (36.8 C)] 97.9 F (36.6 C) (10/30 0510) Pulse Rate:  [66-73] 66 (10/30 0510) Resp:  [16-18] 16 (10/30 0510) BP: (112-128)/(67-75) 112/67 mmHg (10/30 0510) SpO2:  [99 %-100 %] 99 % (10/30 0510) Last BM Date: 02/02/15  Intake/Output from previous day: 10/29 0701 - 10/30 0700 In: 2293.8 [I.V.:2293.8] Out: 50 [Urine:50] Intake/Output this shift: Total I/O In: 547.9 [I.V.:547.9] Out: 0   Physical exam:  Gen.: No acute distress Chest: Clear to auscultation Heart: Regular rate and rhythm Abdomen: Soft, minimally tender to palpation left lower quadrant, nondistended with active bowel sounds.  Lab Results: CBC   Recent Labs  02/01/15 1154 02/02/15 0700  WBC 9.9 8.5  HGB 15.1 12.8*  HCT 45.2 39.1*  PLT 309 243   BMET  Recent Labs  02/01/15 1154 02/02/15 0700  NA 137 139  K 3.4* 3.4*  CL 102 103  CO2 26 28  GLUCOSE 146* 104*  BUN 15 13  CREATININE 1.01 0.89  CALCIUM 9.4 8.7*   PT/INR No results for input(s): LABPROT, INR in the last 72 hours. ABG No results for input(s): PHART, HCO3 in the last 72 hours.  Invalid input(s): PCO2, PO2  Studies/Results: Ct Abdomen Pelvis W Contrast  02/01/2015  CLINICAL DATA:  Abdominal pain, vomiting, prior bowel surgery for diverticulitis in July EXAM: CT ABDOMEN AND PELVIS WITH CONTRAST TECHNIQUE: Multidetector CT imaging of the abdomen and pelvis was performed using the standard protocol following bolus administration of intravenous contrast. CONTRAST:  163mL OMNIPAQUE IOHEXOL 300 MG/ML  SOLN COMPARISON:  12/09/2014 FINDINGS: Lower chest: Mild dependent atelectasis in the bilateral lower lobes. Hepatobiliary: Liver is within normal limits. No  suspicious/enhancing hepatic lesions. Gallbladder is unremarkable. No intrahepatic or extrahepatic ductal dilatation. Pancreas: Within normal limits. Spleen: Within normal limits. Adrenals/Urinary Tract: Adrenal glands are within normal limits. Kidneys are within normal limits.  No hydronephrosis. Bladder is mildly thick-walled although underdistended. Stomach/Bowel: Stomach is within normal limits. No evidence of bowel obstruction. Mobile cecum in the left lower abdomen. Normal appendix (series 2/ image 69). Prior distal colonic resection with anastomosis in the left mid abdomen (series 2/image 81). Extensive colonic diverticulosis. Mild residual pericolonic stranding along the descending colon (series 2/image 34), improved. Moderate left colonic stool burden. Vascular/Lymphatic: No evidence of abdominal aortic aneurysm. No suspicious abdominopelvic lymphadenopathy. Reproductive: Prostate is unremarkable. Other: No abdominopelvic ascites. Moderate fat containing left inguinal hernia (series 2/ image 86). Postsurgical changes related to prior right inguinal hernia repair. Mild fat within the right inguinal canal (series 2/ image 85). Postsurgical changes along the midline anterior abdominal wall (series 2/image 59). Musculoskeletal: Mild degenerative changes at L4-5 and L5-S1. IMPRESSION: Prior distal colonic resection with anastomosis in the left mid abdomen. Extensive colonic diverticulosis. Mild residual pericolonic stranding along the descending colon, improved. No evidence of bowel obstruction.  Normal appendix. Additional stable ancillary findings as above. Electronically Signed   By: [place] Hy M.D.   On: 02/01/2015 18:27    Anti-infectives: Anti-infectives    None      Assessment/Plan:  39 year old male with numerous admissions for abdominal pain since undergoing a sigmoid colectomy for diverticulitis. Has had marked improvement with initiation of bowel return. We'll advance to a regular  diet today as well as add oral MiraLAX  for a daily bowel regimen. Once able tolerate a regular diet without nausea vomiting will then work towards discharge with follow-up with a colorectal surgeon.  Ophelia Sipe T. Adonis Huguenin, MD, FACS  02/03/2015

## 2015-02-04 DIAGNOSIS — R1110 Vomiting, unspecified: Secondary | ICD-10-CM | POA: Insufficient documentation

## 2015-02-04 MED ORDER — ACETAMINOPHEN 160 MG/5ML PO SOLN: 325.0000 mg | ORAL | Status: DC | PRN

## 2015-02-04 MED ORDER — POLYETHYLENE GLYCOL 3350 17 G PO PACK: 17.0000 g | PACK | Freq: Every day | ORAL | Status: DC

## 2015-02-04 MED ORDER — OXYCODONE-ACETAMINOPHEN 5-325 MG/5ML PO SOLN: 5.0000 mL | Freq: Four times a day (QID) | ORAL | Status: DC | PRN

## 2015-02-04 MED ORDER — PANTOPRAZOLE SODIUM 40 MG PO TBEC: 40.0000 mg | DELAYED_RELEASE_TABLET | Freq: Every day | ORAL | Status: DC

## 2015-02-04 MED ORDER — OXYCODONE HCL 5 MG/5ML PO SOLN
5.0000 mg | ORAL | Status: DC | PRN
  Administered 2015-02-04: 5 mg via ORAL
  Filled 2015-02-04: qty 5

## 2015-02-04 NOTE — Progress Notes (Signed)
PHARMACIST - PHYSICIAN COMMUNICATION  CONCERNING: IV to Oral Route Change Policy  RECOMMENDATION: This patient is receiving pantoprazole by the intravenous route.  Based on criteria approved by the Pharmacy and Therapeutics Committee, the intravenous medication(s) is/are being converted to the equivalent oral dose form(s).   DESCRIPTION: These criteria include:  The patient is eating (either orally or via tube) and/or has been taking other orally administered medications for a least 24 hours  The patient has no evidence of active gastrointestinal bleeding or impaired GI absorption (gastrectomy, short bowel, patient on TNA or NPO).  If you have questions about this conversion, please contact the Pharmacy Department  []   [phone] )  [name] [x]   [phone] )  [facility] []   [phone] )  [name] []   [phone] )  [name] []   [phone] )  [name], [facility] 02/04/2015 8:02 AM

## 2015-02-04 NOTE — Discharge Instructions (Signed)
Continue to take miralax daily Eat bland foods for 2-3 days Call or return to ER if you develop fever greater than 101.5, nausea/vomiting, increased pain.

## 2015-02-04 NOTE — Progress Notes (Signed)
Patient feels well tolerated his regular diet for breakfast. He has no fevers or chills and wants to go home. His discharge information is been completed by Dr. Rexene Edison and Dr. Rexene Edison saw the patient earlier this morning. Agree with discharge and follow-up with Dr. Rexene Edison in 1 week

## 2015-02-04 NOTE — Care Management (Signed)
Patient for discharge today with outpatient follow ups. No Needs identified.

## 2015-02-04 NOTE — Progress Notes (Signed)
Surgery Progress Note  S: No nausea, emesis.  Tolerating diet.  Min abd pain O:Blood pressure 112/68, pulse 61, temperature 97.6 F (36.4 C), temperature source Oral, resp. rate 18, height 5\' 9"  (1.753 m), weight 210 lb (95.255 kg), SpO2 100 %. GEN: NAD/A&Ox3 ABD: soft, nontender, nondistended  A/P 39 yo s/p colectomy, postop nausea/vomiting, admitted for sequelae of constipation - bowel regimen - possibly home later if tolerating diet

## 2015-02-04 NOTE — Progress Notes (Signed)
Pt to be discharged per Md order. Iv removed. Instructions reviewed and all questions answered. Pt to be taken out in wheelchair

## 2015-02-08 ENCOUNTER — Telehealth: Payer: Self-pay

## 2015-02-08 ENCOUNTER — Encounter: Payer: Self-pay | Admitting: General Surgery

## 2015-02-08 ENCOUNTER — Other Ambulatory Visit: Payer: Self-pay | Admitting: *Deleted

## 2015-02-08 ENCOUNTER — Encounter (INDEPENDENT_AMBULATORY_CARE_PROVIDER_SITE_OTHER): Payer: Self-pay

## 2015-02-08 ENCOUNTER — Ambulatory Visit (INDEPENDENT_AMBULATORY_CARE_PROVIDER_SITE_OTHER): Payer: Managed Care, Other (non HMO) | Admitting: General Surgery

## 2015-02-08 VITALS — BP 138/81 | HR 76 | Temp 98.0°F | Wt 215.0 lb

## 2015-02-08 DIAGNOSIS — R1030 Lower abdominal pain, unspecified: Secondary | ICD-10-CM | POA: Diagnosis not present

## 2015-02-08 NOTE — Telephone Encounter (Signed)
[name] called asking for a letter to be fax to his job letting him know that he is able to return to work on Monday 02/11/2015 with no restrictions at [phone]. I told him that I would do.

## 2015-02-08 NOTE — Progress Notes (Signed)
Outpatient Surgical Follow Up  02/08/2015  Edward Harris is an 39 y.o. male.   Chief Complaint  Patient presents with  . Follow-up    ED follow up nausea vomiting    HPI:  39 year old male returns to clinic for follow-up from recent hospitalization. Patient is well-known the surgery service for multiple bouts of diverticulitis even after a sigmoid colectomy. Patient's most recent PET was again for persistent nausea, vomiting in total intolerance of by mouth. He was found to have no evidence of diverticulitis on this admission but was severely constituted. His nausea and vomiting improved with return of bowel function which was simulated first with a suppository followed by oral MiraLAX. He is continued on the MiraLAX and has been having daily bowel function since. Still having occasional nausea but has not had any emesis  Since discharge. Also continues to have mild lower to left lower quadrant abdominal discomfort however it is much improved from prior episodes. Denies any fevers, chills, chest pain, shortness of breath , emesis, constipation.  Past Medical History  Diagnosis Date  . Diverticulitis   . Hypertension   . IBS (irritable bowel syndrome)   . Nephrolithiasis   . Arthritis     ankles  . Wears contact lenses     Past Surgical History  Procedure Laterality Date  . Inguinal hernia repair Right   . Kidney stone surgery      extraction/lithotripsy  . Esophagogastroduodenoscopy (egd) with propofol N/A 10/03/2014    Procedure: ESOPHAGOGASTRODUODENOSCOPY (EGD) WITH PROPOFOL;  Surgeon: Lucilla Lame, MD;  Location: [facility];  Service: Endoscopy;  Laterality: N/A;  . Colonoscopy with propofol N/A 10/03/2014    Procedure: COLONOSCOPY WITH PROPOFOL;  Surgeon: Lucilla Lame, MD;  Location: [facility];  Service: Endoscopy;  Laterality: N/A;  . Laparoscopic sigmoid colectomy N/A 10/19/2014    Procedure: LAPAROSCOPIC ASSISTED  SIGMOID COLECTOMY;  Surgeon: Marlyce Huge, MD;  Location: [facility];  Service: General;  Laterality: N/A;  . Colon resection  10/30/2014    Family History  Problem Relation Age of Onset  . Hypertension Mother   . Osteoarthritis Mother   . Heart attack Father   . Hypertension Brother   . Alzheimer's disease Maternal Aunt   . Lung cancer    . Colon cancer Maternal Grandmother 94    Social History:  reports that he has quit smoking. He has never used smokeless tobacco. He reports that he drinks alcohol. He reports that he does not use illicit drugs.  Allergies:  Allergies  Allergen Reactions  . Amlodipine Other (See Comments)    Reaction:  Numbness in arms     Medications reviewed.    ROS   multipoint review of systems was completed. All pertinent positives and negatives within the history of present illness remainder were negative.  BP 138/81 mmHg  Pulse 76  Temp(Src) 98 F (36.7 C) (Oral)  Wt 97.523 kg (215 lb)  Physical Exam   Gen.: No acute distress Chest: Clear to auscultation Heart: Regular rate and rhythm Abdomen: Soft, nondistended, minimal tenderness to the left lower quadrant, well-healed midline incision , bowel sounds present.   No results found for this or any previous visit (from the past 48 hour(s)). No results found.  Assessment/Plan:  1. Lower abdominal pain  38 year old male here for follow-up after recent admission for abdominal pain, nausea, vomiting. Had long conversation about his recurrent bouts of inflammation/infection of the colon. Question whether or not his multiple admissions over the last  several months related to diverticulitis versus anastomotic leak versus constipation. Discussed in detail that at this point is our recommendation that he be evaluated by a colorectal specialist. Provided him with options to choose from and he elects to seek consultation with [facility] in [place]. All questions answered patient can follow up with [place] on an as-needed  basis.     Edward Harris  02/08/2015,negative

## 2015-02-08 NOTE — Patient Instructions (Signed)
We will get a referral for you to be seen at a Colorectal specialist. You have requested a specialist that works out of [facility]. We will call you with this information as soon as we get the appointment made.  Please call our office with any questions or concerns.

## 2015-02-11 ENCOUNTER — Telehealth: Payer: Self-pay

## 2015-02-11 NOTE — Telephone Encounter (Addendum)
Patient seen in office on 02/08/15. Needs a referral to Colorectal Surgeon Specialist. Call made to referral coordinator Janett Billow at [facility] (Dr. Leighton Ruff) at this time [phone] ext. [phone] at this time. No answer. Left message for call back to get referral process started and to make an appointment. Will await return phone call.  Fax- [phone]

## 2015-02-13 NOTE — Telephone Encounter (Signed)
Call made to patient at this time. Spoke to patient's wife [name] and all information below was given. Readback of all information was done during the call. Will call back with any further questions or concerns prior to seeing Colorectal specialist.

## 2015-02-13 NOTE — Telephone Encounter (Signed)
Call made once again to [facility]. Spoke with Janett Billow. Referral made and appointment given is for 11/28 at 1430. Address for clinic is: [address], [place], [place], [place] [address]

## 2015-02-16 NOTE — Discharge Summary (Signed)
Physician Discharge Summary  Patient ID: Edward Harris MRN: [id number] DOB/AGE: 1975/12/21 39 y.o.  Admit date: 02/01/2015 Discharge date: 02/16/2015  Admission Diagnoses:Abdominal pain, emesis, large stool burdon  Discharge Diagnoses:  Constipation  Discharged Condition: good  Hospital Course: Mr. Ashcroft is a 39 yo M with a history of diverticulitis s/p colectomy who presented with increased abdominal pain and nausea.  CT showed mild inflammation around his prior anastamosis but was much improved from prior CT scans.  He also was noted to have a significant colonic stool burdon on this same CT scan, likely due to recent use of narcotic pain medications.  He was admitted for management of abdominal pain, nausea/vomiting and probable constipation.  Consults: None  Significant Diagnostic Studies: radiology: CT scan: Mild but improved inflammation around site of prior anastamosis, + stool burdon  Treatments: IV hydration  Discharge Exam: Blood pressure 112/68, pulse 61, temperature 97.6 F (36.4 C), temperature source Oral, resp. rate 18, height 5\' 9"  (1.753 m), weight 210 lb (95.255 kg), SpO2 100 %. GEN: NAD/A&Ox3 ABD: soft, min tender, nondistended  Disposition: 01-Home or Self Care     Medication List    STOP taking these medications        oxyCODONE-acetaminophen 5-325 MG tablet  Commonly known as:  PERCOCET/ROXICET      TAKE these medications        losartan-hydrochlorothiazide 100-25 MG tablet  Commonly known as:  HYZAAR  Take 1 tablet by mouth daily.     polyethylene glycol packet  Commonly known as:  MIRALAX / GLYCOLAX  Take 17 g by mouth daily.     promethazine 6.25 MG/5ML syrup  Commonly known as:  PHENERGAN  Take 10 mLs (12.5 mg total) by mouth every 8 (eight) hours as needed for nausea or vomiting.           Follow-up Information    Follow up with [name]. Go on 02/08/2015.   Why:  Friday November 4th at 2:00 p.m. Please arrive at 1:45  p.m.    Contact information:   [address]  [place]  [place], [place] [address] [phone]      Signed: Marlyce Huge 02/16/2015, 6:55 AM

## 2015-03-01 ENCOUNTER — Encounter: Payer: Self-pay | Admitting: Emergency Medicine

## 2015-03-01 ENCOUNTER — Inpatient Hospital Stay
Admission: EM | Admit: 2015-03-01 | Discharge: 2015-03-04 | DRG: 392 | Disposition: A | Discharge status: Home / Self Care | Payer: Managed Care, Other (non HMO) | Attending: Internal Medicine | Admitting: Internal Medicine

## 2015-03-01 DIAGNOSIS — Z79891 Long term (current) use of opiate analgesic: Secondary | ICD-10-CM

## 2015-03-01 DIAGNOSIS — M13871 Other specified arthritis, right ankle and foot: Secondary | ICD-10-CM | POA: Diagnosis present

## 2015-03-01 DIAGNOSIS — G8929 Other chronic pain: Secondary | ICD-10-CM | POA: Diagnosis present

## 2015-03-01 DIAGNOSIS — Z9049 Acquired absence of other specified parts of digestive tract: Secondary | ICD-10-CM

## 2015-03-01 DIAGNOSIS — Z79899 Other long term (current) drug therapy: Secondary | ICD-10-CM

## 2015-03-01 DIAGNOSIS — Z87891 Personal history of nicotine dependence: Secondary | ICD-10-CM

## 2015-03-01 DIAGNOSIS — E876 Hypokalemia: Secondary | ICD-10-CM | POA: Diagnosis present

## 2015-03-01 DIAGNOSIS — E785 Hyperlipidemia, unspecified: Secondary | ICD-10-CM | POA: Diagnosis present

## 2015-03-01 DIAGNOSIS — M13872 Other specified arthritis, left ankle and foot: Secondary | ICD-10-CM | POA: Diagnosis present

## 2015-03-01 DIAGNOSIS — I10 Essential (primary) hypertension: Secondary | ICD-10-CM | POA: Diagnosis present

## 2015-03-01 DIAGNOSIS — K589 Irritable bowel syndrome without diarrhea: Secondary | ICD-10-CM | POA: Diagnosis present

## 2015-03-01 DIAGNOSIS — K5909 Other constipation: Principal | ICD-10-CM | POA: Insufficient documentation

## 2015-03-01 DIAGNOSIS — R112 Nausea with vomiting, unspecified: Secondary | ICD-10-CM | POA: Diagnosis present

## 2015-03-01 DIAGNOSIS — R1084 Generalized abdominal pain: Secondary | ICD-10-CM | POA: Diagnosis not present

## 2015-03-01 LAB — CBC WITH DIFFERENTIAL/PLATELET
Basophils Absolute: 0 10*3/uL (ref 0–0.1)
Basophils Relative: 1 %
Eosinophils Absolute: 0 10*3/uL (ref 0–0.7)
Eosinophils Relative: 0 %
HCT: 44.3 % (ref 40.0–52.0)
HEMOGLOBIN: 14.6 g/dL (ref 13.0–18.0)
LYMPHS ABS: 1.1 10*3/uL (ref 1.0–3.6)
LYMPHS PCT: 14 %
MCH: 27.9 pg (ref 26.0–34.0)
MCHC: 33 g/dL (ref 32.0–36.0)
MCV: 84.5 fL (ref 80.0–100.0)
Monocytes Absolute: 0.3 10*3/uL (ref 0.2–1.0)
Monocytes Relative: 4 %
NEUTROS PCT: 81 %
Neutro Abs: 6.3 10*3/uL (ref 1.4–6.5)
Platelets: 256 10*3/uL (ref 150–440)
RBC: 5.24 MIL/uL (ref 4.40–5.90)
RDW: 13.1 % (ref 11.5–14.5)
WBC: 7.7 10*3/uL (ref 3.8–10.6)

## 2015-03-01 LAB — BASIC METABOLIC PANEL
ANION GAP: 9 (ref 5–15)
BUN: 8 mg/dL (ref 6–20)
CHLORIDE: 104 mmol/L (ref 101–111)
CO2: 25 mmol/L (ref 22–32)
Calcium: 9.2 mg/dL (ref 8.9–10.3)
Creatinine, Ser: 0.73 mg/dL (ref 0.61–1.24)
GFR calc non Af Amer: >60 mL/min (ref >60)
Glucose, Bld: 143 mg/dL — ABNORMAL HIGH (ref 65–99)
Potassium: 3.4 mmol/L — ABNORMAL LOW (ref 3.5–5.1)
Sodium: 138 mmol/L (ref 135–145)

## 2015-03-01 LAB — LIPASE, BLOOD: Lipase: 94 U/L — ABNORMAL HIGH (ref 11–51)

## 2015-03-01 MED ORDER — HYDROMORPHONE HCL 1 MG/ML IJ SOLN
0.5000 mg | Freq: Once | INTRAMUSCULAR | Status: AC
  Administered 2015-03-02: 0.5 mg via INTRAVENOUS

## 2015-03-01 MED ORDER — PROMETHAZINE HCL 25 MG/ML IJ SOLN
12.5000 mg | Freq: Four times a day (QID) | INTRAMUSCULAR | Status: DC | PRN
Start: 2015-03-01 — End: 2015-03-02
  Administered 2015-03-02: 12.5 mg via INTRAVENOUS

## 2015-03-01 NOTE — ED Notes (Signed)
Pt. States he had part of large intestine removed July of this year due to diverticulitis.   Pt. States post surgery he has had intermittent nausea and vomiting.  Pt. States this episode started yesterday evening.

## 2015-03-01 NOTE — ED Notes (Signed)
Pt states unable to give urine sample at this time 

## 2015-03-01 NOTE — ED Notes (Signed)
Pt reports n/v since yesterday, reports taking phenergen at home w/o relief.  States unable to keep anything down.  Pt reports hx of having part of large bowel removed due to diverticulitis, surgery in July.  Pt c/o generalized abd pain.  Pt NAD at this time.

## 2015-03-02 ENCOUNTER — Emergency Department: Payer: Managed Care, Other (non HMO)

## 2015-03-02 DIAGNOSIS — E876 Hypokalemia: Secondary | ICD-10-CM | POA: Diagnosis present

## 2015-03-02 DIAGNOSIS — Z9049 Acquired absence of other specified parts of digestive tract: Secondary | ICD-10-CM | POA: Diagnosis not present

## 2015-03-02 DIAGNOSIS — Z79891 Long term (current) use of opiate analgesic: Secondary | ICD-10-CM | POA: Diagnosis not present

## 2015-03-02 DIAGNOSIS — G43A1 Cyclical vomiting, intractable: Secondary | ICD-10-CM | POA: Diagnosis not present

## 2015-03-02 DIAGNOSIS — K5909 Other constipation: Secondary | ICD-10-CM | POA: Insufficient documentation

## 2015-03-02 DIAGNOSIS — R1110 Vomiting, unspecified: Secondary | ICD-10-CM | POA: Diagnosis not present

## 2015-03-02 DIAGNOSIS — R1084 Generalized abdominal pain: Secondary | ICD-10-CM | POA: Diagnosis present

## 2015-03-02 DIAGNOSIS — R1114 Bilious vomiting: Secondary | ICD-10-CM | POA: Diagnosis not present

## 2015-03-02 DIAGNOSIS — I10 Essential (primary) hypertension: Secondary | ICD-10-CM | POA: Diagnosis present

## 2015-03-02 DIAGNOSIS — R112 Nausea with vomiting, unspecified: Secondary | ICD-10-CM | POA: Diagnosis not present

## 2015-03-02 DIAGNOSIS — M13872 Other specified arthritis, left ankle and foot: Secondary | ICD-10-CM | POA: Diagnosis present

## 2015-03-02 DIAGNOSIS — G8929 Other chronic pain: Secondary | ICD-10-CM | POA: Diagnosis present

## 2015-03-02 DIAGNOSIS — K5900 Constipation, unspecified: Secondary | ICD-10-CM

## 2015-03-02 DIAGNOSIS — E785 Hyperlipidemia, unspecified: Secondary | ICD-10-CM | POA: Diagnosis present

## 2015-03-02 DIAGNOSIS — M13871 Other specified arthritis, right ankle and foot: Secondary | ICD-10-CM | POA: Diagnosis present

## 2015-03-02 DIAGNOSIS — Z79899 Other long term (current) drug therapy: Secondary | ICD-10-CM | POA: Diagnosis not present

## 2015-03-02 DIAGNOSIS — Z87891 Personal history of nicotine dependence: Secondary | ICD-10-CM | POA: Diagnosis not present

## 2015-03-02 DIAGNOSIS — K589 Irritable bowel syndrome without diarrhea: Secondary | ICD-10-CM | POA: Diagnosis present

## 2015-03-02 LAB — URINALYSIS COMPLETE WITH MICROSCOPIC (ARMC ONLY)
BACTERIA UA: NONE SEEN
Bilirubin Urine: NEGATIVE
Glucose, UA: NEGATIVE mg/dL
Hgb urine dipstick: NEGATIVE
Leukocytes, UA: NEGATIVE
Nitrite: NEGATIVE
PROTEIN: NEGATIVE mg/dL
SPECIFIC GRAVITY, URINE: 1.016 (ref 1.005–1.030)
SQUAMOUS EPITHELIAL / LPF: NONE SEEN
pH: 7 (ref 5.0–8.0)

## 2015-03-02 MED ORDER — LINACLOTIDE 145 MCG PO CAPS
145.0000 ug | ORAL_CAPSULE | Freq: Every day | ORAL | Status: DC
  Administered 2015-03-02: 145 ug via ORAL
  Filled 2015-03-02 (×3): qty 1

## 2015-03-02 MED ORDER — ONDANSETRON HCL 40 MG/20ML IJ SOLN: 8.0000 mg | Freq: Once | INTRAMUSCULAR | Status: DC

## 2015-03-02 MED ORDER — HYDROMORPHONE HCL 1 MG/ML IJ SOLN
INTRAMUSCULAR | Status: AC
  Administered 2015-03-02: 0.5 mg via INTRAVENOUS
  Filled 2015-03-02: qty 1

## 2015-03-02 MED ORDER — ONDANSETRON HCL 4 MG/2ML IJ SOLN
4.0000 mg | Freq: Four times a day (QID) | INTRAMUSCULAR | Status: DC | PRN
  Administered 2015-03-02 – 2015-03-04 (×4): 4 mg via INTRAVENOUS
  Filled 2015-03-02 (×4): qty 2

## 2015-03-02 MED ORDER — PROMETHAZINE HCL 25 MG/ML IJ SOLN
12.5000 mg | Freq: Four times a day (QID) | INTRAMUSCULAR | Status: DC | PRN
  Administered 2015-03-02 – 2015-03-04 (×5): 12.5 mg via INTRAVENOUS
  Filled 2015-03-02 (×5): qty 1

## 2015-03-02 MED ORDER — POTASSIUM CHLORIDE IN NACL 20-0.9 MEQ/L-% IV SOLN
INTRAVENOUS | Status: DC
  Administered 2015-03-02 – 2015-03-04 (×5): via INTRAVENOUS
  Filled 2015-03-02 (×9): qty 1000

## 2015-03-02 MED ORDER — ENOXAPARIN SODIUM 40 MG/0.4ML ~~LOC~~ SOLN
40.0000 mg | SUBCUTANEOUS | Status: DC
  Administered 2015-03-02: 40 mg via SUBCUTANEOUS
  Filled 2015-03-02 (×2): qty 0.4

## 2015-03-02 MED ORDER — MORPHINE SULFATE (PF) 2 MG/ML IV SOLN
2.0000 mg | Freq: Once | INTRAVENOUS | Status: AC
  Administered 2015-03-02: 2 mg via INTRAVENOUS
  Filled 2015-03-02: qty 1

## 2015-03-02 MED ORDER — KETOROLAC TROMETHAMINE 15 MG/ML IJ SOLN
15.0000 mg | Freq: Four times a day (QID) | INTRAMUSCULAR | Status: DC | PRN
  Administered 2015-03-02 – 2015-03-04 (×6): 15 mg via INTRAVENOUS
  Filled 2015-03-02 (×7): qty 1

## 2015-03-02 MED ORDER — ONDANSETRON HCL 4 MG/2ML IJ SOLN
8.0000 mg | Freq: Once | INTRAMUSCULAR | Status: AC
  Administered 2015-03-02: 8 mg via INTRAVENOUS
  Filled 2015-03-02: qty 4

## 2015-03-02 MED ORDER — PROMETHAZINE HCL 25 MG/ML IJ SOLN
12.5000 mg | Freq: Once | INTRAMUSCULAR | Status: AC
  Administered 2015-03-02: 12.5 mg via INTRAVENOUS
  Filled 2015-03-02: qty 1

## 2015-03-02 MED ORDER — LORAZEPAM 2 MG/ML IJ SOLN
0.5000 mg | Freq: Once | INTRAMUSCULAR | Status: AC
  Administered 2015-03-02: 0.5 mg via INTRAVENOUS
  Filled 2015-03-02: qty 1

## 2015-03-02 MED ORDER — PROMETHAZINE HCL 25 MG/ML IJ SOLN
INTRAMUSCULAR | Status: AC
  Administered 2015-03-02: 12.5 mg via INTRAVENOUS
  Filled 2015-03-02: qty 1

## 2015-03-02 MED ORDER — MORPHINE SULFATE (PF) 2 MG/ML IV SOLN: 2.0000 mg | INTRAVENOUS | Status: DC | PRN

## 2015-03-02 MED ORDER — HYDROMORPHONE HCL 1 MG/ML IJ SOLN
1.0000 mg | Freq: Once | INTRAMUSCULAR | Status: AC
  Administered 2015-03-02: 1 mg via INTRAVENOUS
  Filled 2015-03-02: qty 1

## 2015-03-02 MED ORDER — LACTULOSE 10 GM/15ML PO SOLN
30.0000 g | Freq: Two times a day (BID) | ORAL | Status: DC
  Administered 2015-03-02 – 2015-03-03 (×3): 30 g via ORAL
  Filled 2015-03-02 (×3): qty 60

## 2015-03-02 MED ORDER — HYDROMORPHONE HCL 1 MG/ML IJ SOLN: 1.0000 mg | Freq: Once | INTRAMUSCULAR | Status: DC

## 2015-03-02 MED ORDER — PANTOPRAZOLE SODIUM 40 MG IV SOLR
40.0000 mg | INTRAVENOUS | Status: DC
  Administered 2015-03-02 – 2015-03-03 (×2): 40 mg via INTRAVENOUS
  Filled 2015-03-02 (×2): qty 40

## 2015-03-02 MED ORDER — HYDROMORPHONE HCL 1 MG/ML IJ SOLN
0.5000 mg | INTRAMUSCULAR | Status: DC | PRN
  Administered 2015-03-02: 0.5 mg via INTRAVENOUS

## 2015-03-02 MED ORDER — SODIUM CHLORIDE 0.9 % IV BOLUS (SEPSIS)
1000.0000 mL | Freq: Once | INTRAVENOUS | Status: AC
  Administered 2015-03-02: 1000 mL via INTRAVENOUS

## 2015-03-02 MED ORDER — POLYETHYLENE GLYCOL 3350 17 G PO PACK
17.0000 g | PACK | Freq: Every day | ORAL | Status: DC
  Administered 2015-03-02 – 2015-03-03 (×2): 17 g via ORAL
  Filled 2015-03-02 (×2): qty 1

## 2015-03-02 MED ORDER — ONDANSETRON HCL 4 MG PO TABS: 4.0000 mg | ORAL_TABLET | Freq: Four times a day (QID) | ORAL | Status: DC | PRN

## 2015-03-02 MED ORDER — ACETAMINOPHEN 500 MG PO TABS
1000.0000 mg | ORAL_TABLET | Freq: Four times a day (QID) | ORAL | Status: DC | PRN
  Administered 2015-03-02: 1000 mg via ORAL
  Filled 2015-03-02: qty 2

## 2015-03-02 NOTE — Progress Notes (Signed)
[facility] NAME: Edward Harris    MR#:  [id number]  DATE OF BIRTH:  1975/11/09  SUBJECTIVE: 39 year old male patient admitted because of nausea, vomiting, abdominal pain. Patient had history of diverticulitis status post surgery in July. Has been having constipation on and off associated with abdominal pain and nausea. He tried to drink  MiraLAX today and he felt nauseous after. Because of nausea and not eager to eat lunch.   CHIEF COMPLAINT:   Chief Complaint  Patient presents with  . Nausea    Pt. states chronic, intermittant nausea and vomiting due to diverticulitis.    REVIEW OF SYSTEMS:   Review of Systems  Constitutional: Negative for fever and chills.  HENT: Negative for hearing loss.   Eyes: Negative for blurred vision, double vision and photophobia.  Respiratory: Negative for cough, hemoptysis and shortness of breath.   Cardiovascular: Negative for palpitations, orthopnea and leg swelling.  Gastrointestinal: Positive for nausea, abdominal pain and constipation. Negative for vomiting and diarrhea.  Genitourinary: Negative for dysuria and urgency.  Musculoskeletal: Negative for myalgias and neck pain.  Skin: Negative for rash.  Neurological: Negative for dizziness, focal weakness, seizures, weakness and headaches.  Psychiatric/Behavioral: Negative for memory loss. The patient does not have insomnia.      DRUG ALLERGIES:   Allergies  Allergen Reactions  . Amlodipine Other (See Comments)    Reaction:  Numbness in arms     VITALS:  Blood pressure 142/85, pulse 74, temperature 98.7 F (37.1 C), temperature source Oral, resp. rate 18, height 5\' 9"  (1.753 m), weight 95.255 kg (210 lb), SpO2 100 %.  PHYSICAL EXAMINATION:  GENERAL:  39 y.o.-year-old patient lying in the bed with no acute distress.  EYES: Pupils equal, round, reactive to light and accommodation. No scleral icterus. Extraocular muscles intact.   HEENT: Head atraumatic, normocephalic. Oropharynx and nasopharynx clear.  NECK:  Supple, no jugular venous distention. No thyroid enlargement, no tenderness.  LUNGS: Normal breath sounds bilaterally, no wheezing, rales,rhonchi or crepitation. No use of accessory muscles of respiration.  CARDIOVASCULAR: S1, S2 normal. No murmurs, rubs, or gallops.  ABDOMEN: Mild epigastric tenderness present no rebound tenderness present NEUROLOGIC: Cranial nerves II through XII are intact. Muscle strength 5/5 in all extremities. Sensation intact. Gait not checked.  PSYCHIATRIC: The patient is alert and oriented x 3.  SKIN: No obvious rash, lesion, or ulcer.    LABORATORY PANEL:   CBC  Recent Labs Lab 03/01/15 2254  WBC 7.7  HGB 14.6  HCT 44.3  PLT 256   ------------------------------------------------------------------------------------------------------------------  Chemistries   Recent Labs Lab 03/01/15 2254  NA 138  K 3.4*  CL 104  CO2 25  GLUCOSE 143*  BUN 8  CREATININE 0.73  CALCIUM 9.2   ------------------------------------------------------------------------------------------------------------------  Cardiac Enzymes No results for input(s): TROPONINI in the last 168 hours. ------------------------------------------------------------------------------------------------------------------  RADIOLOGY:  Dg Abd Acute W/chest  03/02/2015  CLINICAL DATA:  Acute onset of intermittent nausea and vomiting. Initial encounter. EXAM: DG ABDOMEN ACUTE W/ 1V CHEST COMPARISON:  CT of the abdomen and pelvis from 02/01/2015 FINDINGS: The lungs are well-aerated and clear. There is no evidence of focal opacification, pleural effusion or pneumothorax. The cardiomediastinal silhouette is within normal limits. The visualized bowel gas pattern is unremarkable. Scattered stool and air are seen within the colon; there is no evidence of small bowel dilatation to suggest obstruction. A small amount of  fluid and air is seen within the stomach. Postoperative  change is noted at the left mid abdomen. No free intra-abdominal air is identified on the provided upright view. No acute osseous abnormalities are seen; the sacroiliac joints are unremarkable in appearance. IMPRESSION: 1. Unremarkable bowel gas pattern; no free intra-abdominal air seen. Small amount of fluid and air noted within the stomach. 2. No acute cardiopulmonary process seen. Electronically Signed   By: Garald Balding M.D.   On: 03/02/2015 01:25    EKG:   Orders placed or performed during the hospital encounter of 02/01/15  . ED EKG  . ED EKG  . EKG 12-Lead  . EKG 12-Lead  . Repeat EKG  . Repeat EKG  . EKG 12-Lead  . EKG 12-Lead    ASSESSMENT AND PLAN:   1. chronic abdominal pain or nausea secondary to constipation: Advised to continue fibre  richj  food  vegetables intake, MiraLAX,d/c narcotic ,    started on Linzess  2. Nausea; Use Zofran as needed. Continue IV hydration for today. He will nausea medicine before eating food. #3 mild hypokalemia replace the potassium.  Discharge tomorrow morning so he can follow-up with his colorectal surgeon on Monday.  All the records are reviewed and case discussed with Care Management/Social Workerr. Management plans discussed with the patient, family and they are in agreement.  CODE STATUS:full  TOTAL TIME TAKING CARE OF THIS PATIENT: 35 minutes.   POSSIBLE D/C IN  1 DAY, DEPENDING ON CLINICAL CONDITION.   Epifanio Lesches M.D on 03/02/2015 at 12:26 PM  Between 7am to 6pm - Pager - [phone]  After 6pm go to [email or web address] - password [email or web address] [facility]  Office  [phone]  CC: Primary care physician; Madelyn Brunner, MD   Note: This dictation was prepared with Dragon dictation along with smaller phrase technology. Any transcriptional errors that result from this process are unintentional.

## 2015-03-02 NOTE — H&P (Signed)
[facility] [place] NAME: Edward Harris    MR#:  [id number]  DATE OF BIRTH:  03-03-1976  DATE OF ADMISSION:  03/01/2015  PRIMARY CARE PHYSICIAN: Madelyn Brunner, MD   REQUESTING/REFERRING PHYSICIAN:   CHIEF COMPLAINT:   Chief Complaint  Patient presents with  . Nausea    Pt. states chronic, intermittant nausea and vomiting due to diverticulitis.    HISTORY OF PRESENT ILLNESS: Edward Harris  is a 39 y.o. male with a known history of diverticular disease, hyperlipidemia pressure presented to the emergency room with abdominal pain, nausea and vomiting since Thursday night. Abdominal pain is aching in nature is generalized in location 6 out of 10 scale of 1-10. Vomitus contained food and water particles. No history of hematemesis. No history of hemoptysis. No history of rectal bleed. No fever or chills. No recent travel or sick contacts at home. Patient had laparotomy and partial colectomy for diverticular disease in July 2016.  PAST MEDICAL HISTORY:   Past Medical History  Diagnosis Date  . Diverticulitis   . Hypertension   . IBS (irritable bowel syndrome)   . Nephrolithiasis   . Arthritis     ankles  . Wears contact lenses     PAST SURGICAL HISTORY:  Past Surgical History  Procedure Laterality Date  . Inguinal hernia repair Right   . Kidney stone surgery      extraction/lithotripsy  . Esophagogastroduodenoscopy (egd) with propofol N/A 10/03/2014    Procedure: ESOPHAGOGASTRODUODENOSCOPY (EGD) WITH PROPOFOL;  Surgeon: Lucilla Lame, MD;  Location: [place];  Service: Endoscopy;  Laterality: N/A;  . Colonoscopy with propofol N/A 10/03/2014    Procedure: COLONOSCOPY WITH PROPOFOL;  Surgeon: Lucilla Lame, MD;  Location: [place];  Service: Endoscopy;  Laterality: N/A;  . Laparoscopic sigmoid colectomy N/A 10/19/2014    Procedure: LAPAROSCOPIC ASSISTED  SIGMOID COLECTOMY;  Surgeon: Marlyce Huge,  MD;  Location: [facility];  Service: General;  Laterality: N/A;  . Colon resection  10/30/2014    SOCIAL HISTORY:  Social History  Substance Use Topics  . Smoking status: Former Research scientist (life sciences)  . Smokeless tobacco: Never Used  . Alcohol Use: 0.0 oz/week    0 Standard drinks or equivalent per week     Comment: "occassional" couple drinks per month    FAMILY HISTORY:  Family History  Problem Relation Age of Onset  . Hypertension Mother   . Osteoarthritis Mother   . Heart attack Father   . Hypertension Brother   . Alzheimer's disease Maternal Aunt   . Lung cancer    . Colon cancer Maternal Grandmother 75    DRUG ALLERGIES:  Allergies  Allergen Reactions  . Amlodipine Other (See Comments)    Reaction:  Numbness in arms     REVIEW OF SYSTEMS:   CONSTITUTIONAL: No fever, fatigue or weakness.  EYES: No blurred or double vision.  EARS, NOSE, AND THROAT: No tinnitus or ear pain.  RESPIRATORY: No cough, shortness of breath, wheezing or hemoptysis.  CARDIOVASCULAR: No chest pain, orthopnea, edema.  GASTROINTESTINAL: Has nausea, vomiting, and abdominal pain.  GENITOURINARY: No dysuria, hematuria.  ENDOCRINE: No polyuria, nocturia,  HEMATOLOGY: No anemia, easy bruising or bleeding SKIN: No rash or lesion. MUSCULOSKELETAL: No joint pain or arthritis.   NEUROLOGIC: No tingling, numbness, weakness.  PSYCHIATRY: No anxiety or depression.   MEDICATIONS AT HOME:  Prior to Admission medications   Medication Sig Start Date End Date Taking? Authorizing Provider  losartan-hydrochlorothiazide (  HYZAAR) 100-25 MG per tablet Take 1 tablet by mouth daily.   Yes Historical Provider, MD  polyethylene glycol (MIRALAX / GLYCOLAX) packet Take 17 g by mouth daily. 02/04/15  Yes Marlyce Huge, MD  promethazine (PHENERGAN) 6.25 MG/5ML syrup Take 10 mLs (12.5 mg total) by mouth every 8 (eight) hours as needed for nausea or vomiting. 12/12/14  Yes Marlyce Huge, MD  oxycodone (OXY-IR) 5 MG  capsule take 1 to 2 capsules by mouth 6 hours if needed for severe pain 01/25/15   Historical Provider, MD      PHYSICAL EXAMINATION:   VITAL SIGNS: Blood pressure 135/88, pulse 83, temperature 98.9 F (37.2 C), temperature source Oral, resp. rate 16, height 5\' 9"  (1.753 m), weight 95.255 kg (210 lb), SpO2 100 %.  GENERAL:  39 y.o.-year-old patient lying in the bed with no acute distress.  EYES: Pupils equal, round, reactive to light and accommodation. No scleral icterus. Extraocular muscles intact.  HEENT: Head atraumatic, normocephalic. Oropharynx and nasopharynx clear.  NECK:  Supple, no jugular venous distention. No thyroid enlargement, no tenderness.  LUNGS: Normal breath sounds bilaterally, no wheezing, rales,rhonchi or crepitation. No use of accessory muscles of respiration.  CARDIOVASCULAR: S1, S2 normal. No murmurs, rubs, or gallops.  ABDOMEN: Soft, nontender, nondistended. Bowel sounds present. No organomegaly or mass.  EXTREMITIES: No pedal edema, cyanosis, or clubbing.  NEUROLOGIC: Cranial nerves II through XII are intact. Muscle strength 5/5 in all extremities. Sensation intact. Gait not checked.  PSYCHIATRIC: The patient is alert and oriented x 3.  SKIN: No obvious rash, lesion, or ulcer.   LABORATORY PANEL:   CBC  Recent Labs Lab 03/01/15 2254  WBC 7.7  HGB 14.6  HCT 44.3  PLT 256  MCV 84.5  MCH 27.9  MCHC 33.0  RDW 13.1  LYMPHSABS 1.1  MONOABS 0.3  EOSABS 0.0  BASOSABS 0.0   ------------------------------------------------------------------------------------------------------------------  Chemistries   Recent Labs Lab 03/01/15 2254  NA 138  K 3.4*  CL 104  CO2 25  GLUCOSE 143*  BUN 8  CREATININE 0.73  CALCIUM 9.2   ------------------------------------------------------------------------------------------------------------------ estimated creatinine clearance is 142.6 mL/min (by C-G formula based on Cr of  0.73). ------------------------------------------------------------------------------------------------------------------ No results for input(s): TSH, T4TOTAL, T3FREE, THYROIDAB in the last 72 hours.  Invalid input(s): FREET3   Coagulation profile No results for input(s): INR, PROTIME in the last 168 hours. ------------------------------------------------------------------------------------------------------------------- No results for input(s): DDIMER in the last 72 hours. -------------------------------------------------------------------------------------------------------------------  Cardiac Enzymes No results for input(s): CKMB, TROPONINI, MYOGLOBIN in the last 168 hours.  Invalid input(s): CK ------------------------------------------------------------------------------------------------------------------ Invalid input(s): POCBNP  ---------------------------------------------------------------------------------------------------------------  Urinalysis    Component Value Date/Time   COLORURINE YELLOW* 03/02/2015 0516   COLORURINE Yellow 07/23/2014 1007   APPEARANCEUR CLEAR* 03/02/2015 0516   APPEARANCEUR Clear 07/23/2014 1007   LABSPEC 1.016 03/02/2015 0516   LABSPEC 1.026 07/23/2014 1007   PHURINE 7.0 03/02/2015 0516   PHURINE 6.0 07/23/2014 1007   GLUCOSEU NEGATIVE 03/02/2015 0516   GLUCOSEU 50 mg/dL 07/23/2014 1007   HGBUR NEGATIVE 03/02/2015 0516   HGBUR 1+ 07/23/2014 1007   BILIRUBINUR NEGATIVE 03/02/2015 0516   BILIRUBINUR Negative 07/23/2014 1007   KETONESUR 1+* 03/02/2015 0516   KETONESUR Negative 07/23/2014 1007   PROTEINUR NEGATIVE 03/02/2015 0516   PROTEINUR 30 mg/dL 07/23/2014 1007   NITRITE NEGATIVE 03/02/2015 0516   NITRITE Negative 07/23/2014 1007   LEUKOCYTESUR NEGATIVE 03/02/2015 0516   LEUKOCYTESUR Negative 07/23/2014 1007     RADIOLOGY: Dg Abd Acute W/chest  03/02/2015  CLINICAL DATA:  Acute onset of intermittent nausea and vomiting.  Initial encounter. EXAM: DG ABDOMEN ACUTE W/ 1V CHEST COMPARISON:  CT of the abdomen and pelvis from 02/01/2015 FINDINGS: The lungs are well-aerated and clear. There is no evidence of focal opacification, pleural effusion or pneumothorax. The cardiomediastinal silhouette is within normal limits. The visualized bowel gas pattern is unremarkable. Scattered stool and air are seen within the colon; there is no evidence of small bowel dilatation to suggest obstruction. A small amount of fluid and air is seen within the stomach. Postoperative change is noted at the left mid abdomen. No free intra-abdominal air is identified on the provided upright view. No acute osseous abnormalities are seen; the sacroiliac joints are unremarkable in appearance. IMPRESSION: 1. Unremarkable bowel gas pattern; no free intra-abdominal air seen. Small amount of fluid and air noted within the stomach. 2. No acute cardiopulmonary process seen. Electronically Signed   By: Garald Balding M.D.   On: 03/02/2015 01:25    EKG: Orders placed or performed during the hospital encounter of 02/01/15  . ED EKG  . ED EKG  . EKG 12-Lead  . EKG 12-Lead  . Repeat EKG  . Repeat EKG  . EKG 12-Lead  . EKG 12-Lead    IMPRESSION AND PLAN:  1. Abdominal pain 2. Intractable nausea vomiting 3. History of colectomy for diverticular disease 4. Hypertension Management plan IV hydration with normal saline Potassium supplementation Pain management with IV morphine Antiemetics DVT prophylaxis subcutaneous Lovenox Surgery consult  All the records are reviewed and case discussed with ED provider. Management plans discussed with the patient, family and they are in agreement.  CODE STATUS:    TOTAL TIME TAKING CARE OF THIS PATIENT: 44 minutes.    Saundra Shelling M.D on 03/02/2015 at 5:51 AM  Between 7am to 6pm - Pager - [phone]  After 6pm go to [email or web address] - password [email or web address] [facility]  Office   [phone]  CC: Primary care physician; Madelyn Brunner, MD

## 2015-03-02 NOTE — ED Notes (Signed)
Pt states still unable to give urine sample.  MD informed.

## 2015-03-02 NOTE — Progress Notes (Signed)
Initial Nutrition Assessment      INTERVENTION:  Meals and snacks: await diet progression    NUTRITION DIAGNOSIS:   Inadequate oral intake related to altered GI function as evidenced by meal completion < 25%, per patient/family report.    GOAL:   Patient will meet greater than or equal to 90% of their needs    MONITOR:    (Energy intake, Digestive system)  REASON FOR ASSESSMENT:   Malnutrition Screening Tool    ASSESSMENT:      Pt admitted with nausea, vomiting, abdominal pain, chronic constipation  Past Medical History  Diagnosis Date  . Diverticulitis   . Hypertension   . IBS (irritable bowel syndrome)   . Nephrolithiasis   . Arthritis     ankles  . Wears contact lenses     Current Nutrition: took few sips of apple juice this am and felt nauseated  Food/Nutrition-Related History: reports good appetite prior to admission except for day or two.     Scheduled Medications:  . enoxaparin (LOVENOX) injection  40 mg Subcutaneous Q24H  . lactulose  30 g Oral BID  . Linaclotide  145 mcg Oral Daily  . pantoprazole (PROTONIX) IV  40 mg Intravenous Q24H  . polyethylene glycol  17 g Oral Daily    Continuous Medications:  . 0.9 % NaCl with KCl 20 mEq / L 125 mL/hr at 03/02/15 0705     Electrolyte/Renal Profile and Glucose Profile:   Recent Labs Lab 03/01/15 2254  NA 138  K 3.4*  CL 104  CO2 25  BUN 8  CREATININE 0.73  CALCIUM 9.2  GLUCOSE 143*    Gastrointestinal Profile: Last BM: 11/25    Weight Change: wt encounters reviewed and noted wt up and down prior to admission within 5 pounds    Diet Order:  Diet clear liquid Room service appropriate?: Yes; Fluid consistency:: Thin  Skin:   reviewed   Height:   Ht Readings from Last 1 Encounters:  03/01/15 5\' 9"  (1.753 m)    Weight:   Wt Readings from Last 1 Encounters:  03/01/15 210 lb (95.255 kg)    Ideal Body Weight:     BMI:  Body mass index is 31 kg/(m^2).  Estimated  Nutritional Needs:   Kcal:  BEE 1860 kcals (IF 1.0-1.2, AF 1.3) [id number] kcals/d.   Protein:  (1.0-1.2 g/kg) 95-114 g/d  Fluid:  (25-85ml/kg) 2375-2858ml/d  EDUCATION NEEDS:   No education needs identified at this time  [place]. Zenia Resides, [place], [place] (pager)

## 2015-03-02 NOTE — H&P (Signed)
Edward Harris is an 39 y.o. male.   Chief Complaint: nausea and vomiting HPI: 39 yr old male well known to surgical team, s/p Lap sigmoid for recurrent diverticulitis in 7/16.  He comes in complaining of nausea vomiting and abdominal pain.  This has been his same complaint since before the operation in July.  He states pain comes and goes and travels from upper abdomen down to left lower abdomen.  He states he has been constipated as well but does not feel the miralax is working.  He states he only drinks about a glass of water a day.  He denies any fever, chills, chest pain, sob, diarrhea, dysuria or hematuria.   Past Medical History  Diagnosis Date  . Diverticulitis   . Hypertension   . IBS (irritable bowel syndrome)   . Nephrolithiasis   . Arthritis     ankles  . Wears contact lenses     Past Surgical History  Procedure Laterality Date  . Inguinal hernia repair Right   . Kidney stone surgery      extraction/lithotripsy  . Esophagogastroduodenoscopy (egd) with propofol N/A 10/03/2014    Procedure: ESOPHAGOGASTRODUODENOSCOPY (EGD) WITH PROPOFOL;  Surgeon: Midge Minium, MD;  Location[facility];  Service: Endoscopy;  Laterality: N/A;  . Colonoscopy with propofol N/A 10/03/2014    Procedure: COLONOSCOPY WITH PROPOFOL;  Surgeon: Midge Minium, MD;  Location: [facility];  Service: Endoscopy;  Laterality: N/A;  . Laparoscopic sigmoid colectomy N/A 10/19/2014    Procedure: LAPAROSCOPIC ASSISTED  SIGMOID COLECTOMY;  Surgeon: Ida Rogue, MD;  Location: [facility];  Service: General;  Laterality: N/A;  . Colon resection  10/30/2014    Family History  Problem Relation Age of Onset  . Hypertension Mother   . Osteoarthritis Mother   . Heart attack Father   . Hypertension Brother   . Alzheimer's disease Maternal Aunt   . Lung cancer    . Colon cancer Maternal Grandmother 57   Social History:  reports that he has quit smoking. He has never used smokeless tobacco.  He reports that he drinks alcohol. He reports that he does not use illicit drugs.  Allergies:  Allergies  Allergen Reactions  . Amlodipine Other (See Comments)    Reaction:  Numbness in arms     Medications Prior to Admission  Medication Sig Dispense Refill  . losartan-hydrochlorothiazide (HYZAAR) 100-25 MG per tablet Take 1 tablet by mouth daily.    . polyethylene glycol (MIRALAX / GLYCOLAX) packet Take 17 g by mouth daily. 30 each 3  . promethazine (PHENERGAN) 6.25 MG/5ML syrup Take 10 mLs (12.5 mg total) by mouth every 8 (eight) hours as needed for nausea or vomiting. 150 mL 0  . oxycodone (OXY-IR) 5 MG capsule take 1 to 2 capsules by mouth 6 hours if needed for severe pain  0    Results for orders placed or performed during the hospital encounter of 03/01/15 (from the past 48 hour(s))  Basic metabolic panel     Status: Abnormal   Collection Time: 03/01/15 10:54 PM  Result Value Ref Range   Sodium 138 135 - 145 mmol/L   Potassium 3.4 (L) 3.5 - 5.1 mmol/L   Chloride 104 101 - 111 mmol/L   CO2 25 22 - 32 mmol/L   Glucose, Bld 143 (H) 65 - 99 mg/dL   BUN 8 6 - 20 mg/dL   Creatinine, Ser 4.62 0.61 - 1.24 mg/dL   Calcium 9.2 8.9 - 70.3 mg/dL  GFR calc non Af Amer >60 >60 mL/min   GFR calc Af Amer >60 >60 mL/min    Comment: (NOTE) The eGFR has been calculated using the CKD EPI equation. This calculation has not been validated in all clinical situations. eGFR's persistently <60 mL/min signify possible Chronic Kidney Disease.    Anion gap 9 5 - 15  Lipase, blood     Status: Abnormal   Collection Time: 03/01/15 10:54 PM  Result Value Ref Range   Lipase 94 (H) 11 - 51 U/L  CBC WITH DIFFERENTIAL     Status: None   Collection Time: 03/01/15 10:54 PM  Result Value Ref Range   WBC 7.7 3.8 - 10.6 K/uL   RBC 5.24 4.40 - 5.90 MIL/uL   Hemoglobin 14.6 13.0 - 18.0 g/dL   HCT 44.3 40.0 - 52.0 %   MCV 84.5 80.0 - 100.0 fL   MCH 27.9 26.0 - 34.0 pg   MCHC 33.0 32.0 - 36.0 g/dL   RDW  13.1 11.5 - 14.5 %   Platelets 256 150 - 440 K/uL   Neutrophils Relative % 81 %   Neutro Abs 6.3 1.4 - 6.5 K/uL   Lymphocytes Relative 14 %   Lymphs Abs 1.1 1.0 - 3.6 K/uL   Monocytes Relative 4 %   Monocytes Absolute 0.3 0.2 - 1.0 K/uL   Eosinophils Relative 0 %   Eosinophils Absolute 0.0 0 - 0.7 K/uL   Basophils Relative 1 %   Basophils Absolute 0.0 0 - 0.1 K/uL  Urinalysis complete, with microscopic ([facility] only)     Status: Abnormal   Collection Time: 03/02/15  5:16 AM  Result Value Ref Range   Color, Urine YELLOW (A) YELLOW   APPearance CLEAR (A) CLEAR   Glucose, UA NEGATIVE NEGATIVE mg/dL   Bilirubin Urine NEGATIVE NEGATIVE   Ketones, ur 1+ (A) NEGATIVE mg/dL   Specific Gravity, Urine 1.016 1.005 - 1.030   Hgb urine dipstick NEGATIVE NEGATIVE   pH 7.0 5.0 - 8.0   Protein, ur NEGATIVE NEGATIVE mg/dL   Nitrite NEGATIVE NEGATIVE   Leukocytes, UA NEGATIVE NEGATIVE   RBC / HPF 0-5 0 - 5 RBC/hpf   WBC, UA 0-5 0 - 5 WBC/hpf   Bacteria, UA NONE SEEN NONE SEEN   Squamous Epithelial / LPF NONE SEEN NONE SEEN   Mucous PRESENT    Dg Abd Acute W/chest  03/02/2015  CLINICAL DATA:  Acute onset of intermittent nausea and vomiting. Initial encounter. EXAM: DG ABDOMEN ACUTE W/ 1V CHEST COMPARISON:  CT of the abdomen and pelvis from 02/01/2015 FINDINGS: The lungs are well-aerated and clear. There is no evidence of focal opacification, pleural effusion or pneumothorax. The cardiomediastinal silhouette is within normal limits. The visualized bowel gas pattern is unremarkable. Scattered stool and air are seen within the colon; there is no evidence of small bowel dilatation to suggest obstruction. A small amount of fluid and air is seen within the stomach. Postoperative change is noted at the left mid abdomen. No free intra-abdominal air is identified on the provided upright view. No acute osseous abnormalities are seen; the sacroiliac joints are unremarkable in appearance. IMPRESSION: 1. Unremarkable  bowel gas pattern; no free intra-abdominal air seen. Small amount of fluid and air noted within the stomach. 2. No acute cardiopulmonary process seen. Electronically Signed   By: Garald Balding M.D.   On: 03/02/2015 01:25    Review of Systems  Constitutional: Negative for fever, chills, weight loss and malaise/fatigue.  HENT: Negative  for congestion.   Respiratory: Negative for cough and shortness of breath.   Cardiovascular: Negative for chest pain and leg swelling.  Gastrointestinal: Positive for nausea, vomiting, abdominal pain and constipation. Negative for heartburn, diarrhea, blood in stool and melena.  Genitourinary: Negative for dysuria, urgency and hematuria.  Musculoskeletal: Negative for back pain.  Skin: Negative for itching and rash.  Neurological: Negative for dizziness, loss of consciousness, weakness and headaches.  Psychiatric/Behavioral: The patient is not nervous/anxious.   All other systems reviewed and are negative.   Blood pressure 145/72, pulse 82, temperature 99 F (37.2 C), temperature source Oral, resp. rate 18, height $RemoveBe'5\' 9"'ImEpuvoRf$  (1.753 m), weight 210 lb (95.255 kg), SpO2 100 %. Physical Exam  Vitals reviewed. Constitutional: He is oriented to person, place, and time. He appears well-developed and well-nourished. No distress.  HENT:  Head: Normocephalic and atraumatic.  Right Ear: External ear normal.  Left Ear: External ear normal.  Nose: Nose normal.  Mouth/Throat: Oropharynx is clear and moist. No oropharyngeal exudate.  Eyes: Conjunctivae are normal. Pupils are equal, round, and reactive to light. No scleral icterus.  Neck: Normal range of motion. Neck supple. No tracheal deviation present.  Cardiovascular: Normal rate, regular rhythm, normal heart sounds and intact distal pulses.  Exam reveals no gallop and no friction rub.   No murmur heard. Respiratory: Effort normal and breath sounds normal. No respiratory distress.  GI: Soft. Bowel sounds are normal. He  exhibits no distension. There is no tenderness.  Incision sites well healed  Musculoskeletal: Normal range of motion. He exhibits no edema or tenderness.  Neurological: He is alert and oriented to person, place, and time. No cranial nerve deficit.  Skin: Skin is warm and dry. No rash noted. No erythema.  Psychiatric: He has a normal mood and affect. His behavior is normal. Judgment and thought content normal.     Assessment/Plan I have personally reviewed his past records for his diverticulitis, his laboratory values which are unremarkable and his past CT scan images and radiology reads.  I believe he has Chronic Constipation which caused his diverticulitis.  He does not have a recurrence of diverticulitis but is having the sequale from long-standing constipation, which can cause nausea, vomiting, and intermittent severe cramping abdominal pain which travels, as well as diverticula.  I had an extensive discussion with him about his water, fiber, fruit and vegetable intake, which is the only way miralax and fiber will work.  I have d/c'd any narcotic medications as this will only make the situation worsen.  He has toradol and tylenol for pain.  I have added scheduled Lactulose and miralax and started Linzess.   He has an appointment with the colorectal specialist on Monday 11/28 in [place].  If he can start tolerating liquids would ideally make sure he can get to that appointment.    Would also recommend he see Dr. Allen Norris, GI as an outpatient to help manage the chronic constipation.    I have spent over 65 minutes with the patient with 50% of that time discussing, educating and counseling the patient as well as coordination of care.    L[place]ona [place] Rollins Wrightson 03/02/2015, 9:17 AM

## 2015-03-02 NOTE — ED Notes (Signed)
MD at bedside. 

## 2015-03-02 NOTE — ED Provider Notes (Signed)
[facility] Emergency Department Provider Note  ____________________________________________  Time seen: 00:15  I have reviewed the triage vital signs and the nursing notes.  History by:  Patient  HISTORY  Chief Complaint Nausea     HPI BRIDGE MARZ is a 39 y.o. male who began to develop abdominal pain on Thursday, a day and a half ago. He also has nausea. He has had some vomiting. He denies any diarrhea or change in his bowel movements. Last bowel movement was earlier today.  He reports this pain is "all over" and is 10 over 10. The patient looks only mildly uncomfortable. He is calm and communicative.  Patient does report that he has had some subjective fever and chills. Here, his temperature is normal at 98.9.  The patient had a resection performed due to diverticulitis this past summer. He denies any complications since.    Past Medical History  Diagnosis Date  . Diverticulitis   . Hypertension   . IBS (irritable bowel syndrome)   . Nephrolithiasis   . Arthritis     ankles  . Wears contact lenses     Patient Active Problem List   Diagnosis Date Noted  . Emesis   . Esophageal bleeding 01/19/2015  . Generalized abdominal pain   . Intractable nausea and vomiting 12/10/2014  . Nausea   . Abdominal pain 11/14/2014  . 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 Benign neoplasm of sigmoid colon   . Diverticulitis of large intestine without perforation or abscess without bleeding   . Nausea with vomiting   . Esophageal hemorrhage   . Gastric catarrh   . Idiopathic colitis   . Diverticulitis of colon 09/26/2014  . Non-intractable cyclical vomiting with nausea 09/26/2014  . Diverticulitis large intestine w/o perforation or abscess w/o bleeding 09/07/2014  . Diverticulitis 08/19/2014  . Ileus ([place]) 08/19/2014  . Hypertension 08/19/2014  . Intestinal obstruction due to decreased peristalsis ([place]) 08/19/2014  . 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  Renal colic 0000000  . Hydronephrosis 12/08/2011  . Calculi, ureter 12/08/2011    Past Surgical History  Procedure Laterality Date  . Inguinal hernia repair Right   . Kidney stone surgery      extraction/lithotripsy  . Esophagogastroduodenoscopy (egd) with propofol N/A 10/03/2014    Procedure: ESOPHAGOGASTRODUODENOSCOPY (EGD) WITH PROPOFOL;  Surgeon: Lucilla Lame, MD;  Location: [facility];  Service: Endoscopy;  Laterality: N/A;  . Colonoscopy with propofol N/A 10/03/2014    Procedure: COLONOSCOPY WITH PROPOFOL;  Surgeon: Lucilla Lame, MD;  Location: [facility];  Service: Endoscopy;  Laterality: N/A;  . Laparoscopic sigmoid colectomy N/A 10/19/2014    Procedure: LAPAROSCOPIC ASSISTED  SIGMOID COLECTOMY;  Surgeon: Marlyce Huge, MD;  Location: [facility];  Service: General;  Laterality: N/A;  . Colon resection  10/30/2014    Current Outpatient Rx  Name  Route  Sig  Dispense  Refill  . losartan-hydrochlorothiazide (HYZAAR) 100-25 MG per tablet   Oral   Take 1 tablet by mouth daily.         . polyethylene glycol (MIRALAX / GLYCOLAX) packet   Oral   Take 17 g by mouth daily.   30 each   3   . promethazine (PHENERGAN) 6.25 MG/5ML syrup   Oral   Take 10 mLs (12.5 mg total) by mouth every 8 (eight) hours as needed for nausea or vomiting.   150 mL   0   . oxycodone (OXY-IR) 5 MG capsule      take 1  to 2 capsules by mouth 6 hours if needed for severe pain      0     Allergies Amlodipine  Family History  Problem Relation Age of Onset  . Hypertension Mother   . Osteoarthritis Mother   . Heart attack Father   . Hypertension Brother   . Alzheimer's disease Maternal Aunt   . Lung cancer    . Colon cancer Maternal Grandmother 75    Social History Social History  Substance Use Topics  . Smoking status: Former Research scientist (life sciences)  . Smokeless tobacco: Never Used  . Alcohol Use: 0.0 oz/week    0 Standard drinks or equivalent per week     Comment:  "occassional" couple drinks per month    Review of Systems  Constitutional: Positive for subjective fever and chills. ENT: Negative for congestion. Cardiovascular: Negative for chest pain. Respiratory: Negative for cough. Gastrointestinal: History of bowel resection due to diverticulitis. Abdominal pain, nausea, see history of present illness. Genitourinary: Negative for dysuria. Musculoskeletal: No myalgias or injuries. Skin: Negative for rash. Neurological: Negative for headache or focal weakness   10-point ROS otherwise negative.  ____________________________________________   PHYSICAL EXAM:  VITAL SIGNS: ED Triage Vitals  Enc Vitals Group     BP 03/01/15 2246 157/87 mmHg     Pulse Rate 03/01/15 2246 103     Resp 03/01/15 2246 18     Temp 03/01/15 2246 98.9 F (37.2 C)     Temp Source 03/01/15 2246 Oral     SpO2 03/01/15 2246 100 %     Weight 03/01/15 2246 210 lb (95.255 kg)     Height 03/01/15 2246 5\' 9"  (1.753 m)     Head Cir --      Peak Flow --      Pain Score 03/01/15 2248 10     Pain Loc --      Pain Edu? --      Excl. in [place]? --     Constitutional:  Alert and oriented. Minimal discomfort, calm and communicative. ENT   Head: Normocephalic and atraumatic.   Nose: No congestion/rhinnorhea.       Mouth: No erythema, no swelling   Cardiovascular: Normal rate, regular rhythm, no murmur noted Respiratory:  Normal respiratory effort, no tachypnea.    Breath sounds are clear and equal bilaterally.  Gastrointestinal: Soft, no distention. Mild tenderness on palpation, primarily of the right upper quadrant and left lower quadrant. Back: No muscle spasm, no tenderness, no CVA tenderness. Musculoskeletal: No deformity noted. Nontender with normal range of motion in all extremities.  No noted edema. Neurologic:  Communicative. Normal appearing spontaneous movement in all 4 extremities. No gross focal neurologic deficits are appreciated.  Skin:  Skin is warm,  dry. No rash noted. Psychiatric: Mood and affect are normal. Speech and behavior are normal.  ____________________________________________    LABS (pertinent positives/negatives)  Labs Reviewed  BASIC METABOLIC PANEL - Abnormal; Notable for the following:    Potassium 3.4 (*)    Glucose, Bld 143 (*)    All other components within normal limits  LIPASE, BLOOD - Abnormal; Notable for the following:    Lipase 94 (*)    All other components within normal limits  CBC WITH DIFFERENTIAL/PLATELET  URINALYSIS COMPLETEWITH MICROSCOPIC ([facility] ONLY)     ____________________________________________   RADIOLOGY  Abdominal x-ray series:  IMPRESSION: 1. Unremarkable bowel gas pattern; no free intra-abdominal air seen. Small amount of fluid and air noted within the stomach. 2. No acute cardiopulmonary process  seen  ____________________________________________   INITIAL IMPRESSION / ASSESSMENT AND PLAN / ED COURSE  Pertinent labs & imaging results that were available during my care of the patient were reviewed by me and considered in my medical decision making (see chart for details).  Patient with nausea and abdominal discomfort. He requests Phenergan and Dilaudid, which we have now provided. His abdomen appears mildly tender without distention. We will obtain an x-ray to rule out possible obstruction status post surgery 5 months ago. His blood tests overall look good with a white blood cell count of 7.7. I will discuss the pros and cons of CT scan and this time with the patient and determine with him whether he would like to undergo that test.  ----------------------------------------- 3:37 AM on 03/02/2015 -----------------------------------------  X-rays negative for obstruction. Patient has been doing better overall, but he reports his nausea is starting to come back. I've reviewed his history further with him. Since having had his resection this past summer, he has had other episodes  similar to this one. He has had a CT scan each time. If we did a CT scan tonight, it would be his sixth and 6 months. He reports that the CT scans do not show any acute changes. I reviewed his last CT scan in October, which showed no acute changes with stranding near the anastomosis having improved. Given his reasonable blood work and multiple normal CT scans, I do not think a repeat CT scan at this time is in the patient's best interest. The patient agrees.  We will treat his continuing nausea with additional Phenergan as well as Ativan, 0.5 mg.  ----------------------------------------- 5:19 AM on 03/02/2015 -----------------------------------------  The patient has thrown up again. He continues to feel uncomfortable. He would prefer to be admitted to the hospital for his refractory nausea vomiting. I think that is reasonable. We will move forward with more aggressive treatment as we moved towards admission. I will treat him with 1 mg of Dilaudid and 8 mg of Zofran. He does report that Zofran does not usually work for him, however he does not tolerate Reglan well and he is feeling with Phenergan IV currently.  I will speak with the hospitalist about admission for this refractory nausea vomiting. I have also spoken with Dr. Phoebe Perch, general surgery, who agrees with the plan and will consult on the patient's care in the hospital.  ____________________________________________   FINAL CLINICAL IMPRESSION(S) / ED DIAGNOSES  Final diagnoses:  Refractory nausea and vomiting  Generalized abdominal pain      Ahmed Prima, MD 03/02/15 [phone]

## 2015-03-02 NOTE — ED Notes (Signed)
Pt states still unable to give urine sample at this time.

## 2015-03-03 LAB — CBC
HEMATOCRIT: 43.2 % (ref 40.0–52.0)
Hemoglobin: 14.3 g/dL (ref 13.0–18.0)
MCH: 28.3 pg (ref 26.0–34.0)
MCHC: 33.2 g/dL (ref 32.0–36.0)
MCV: 85.2 fL (ref 80.0–100.0)
Platelets: 248 10*3/uL (ref 150–440)
RBC: 5.08 MIL/uL (ref 4.40–5.90)
RDW: 13.3 % (ref 11.5–14.5)
WBC: 7.8 10*3/uL (ref 3.8–10.6)

## 2015-03-03 LAB — BASIC METABOLIC PANEL
Anion gap: 7 (ref 5–15)
BUN: 7 mg/dL (ref 6–20)
CO2: 24 mmol/L (ref 22–32)
Calcium: 9 mg/dL (ref 8.9–10.3)
Chloride: 104 mmol/L (ref 101–111)
Creatinine, Ser: 0.77 mg/dL (ref 0.61–1.24)
GFR calc Af Amer: >60 mL/min (ref >60)
GLUCOSE: 107 mg/dL — AB (ref 65–99)
POTASSIUM: 3.7 mmol/L (ref 3.5–5.1)
Sodium: 135 mmol/L (ref 135–145)

## 2015-03-03 MED ORDER — PANTOPRAZOLE SODIUM 40 MG PO TBEC
40.0000 mg | DELAYED_RELEASE_TABLET | Freq: Two times a day (BID) | ORAL | Status: DC
  Administered 2015-03-03: 40 mg via ORAL
  Filled 2015-03-03: qty 1

## 2015-03-03 MED ORDER — HYDRALAZINE HCL 25 MG PO TABS
25.0000 mg | ORAL_TABLET | Freq: Three times a day (TID) | ORAL | Status: DC
  Administered 2015-03-03 – 2015-03-04 (×2): 25 mg via ORAL
  Filled 2015-03-03 (×2): qty 1

## 2015-03-03 NOTE — Progress Notes (Signed)
[place] [facility] [place] NAME: Edward Harris    MR#:  [id number]  DATE OF BIRTH:  [birth year]  SUBJECTIVE: 39 year old male patient admitted because of nausea, vomiting, abdominal pain. Patient had history of diverticulitis status post surgery in July. Has been having constipation on and off associated with abdominal pain and nausea. Still complains of abdominal pain, nausea requesting IV morphine. Admitted 1 dose of IV morphine last night from the night physician. He says it helped him a lot. I told him that morphine and other narcotics. can cause constipation and I cannot give him any morphine. Morphine is not the  Treatment for abdominal pain related to constipation.  CHIEF COMPLAINT:   Chief Complaint  Patient presents with  . Nausea    Pt. states chronic, intermittant nausea and vomiting due to diverticulitis.    REVIEW OF SYSTEMS:   Review of Systems  Constitutional: Negative for fever and chills.  HENT: Negative for hearing loss.   Eyes: Negative for blurred vision, double vision and photophobia.  Respiratory: Negative for cough, hemoptysis and shortness of breath.   Cardiovascular: Negative for palpitations, orthopnea and leg swelling.  Gastrointestinal: Positive for nausea, abdominal pain and constipation. Negative for vomiting and diarrhea.  Genitourinary: Negative for dysuria and urgency.  Musculoskeletal: Negative for myalgias and neck pain.  Skin: Negative for rash.  Neurological: Negative for dizziness, focal weakness, seizures, weakness and headaches.  Psychiatric/Behavioral: Negative for memory loss. The patient does not have insomnia.      DRUG ALLERGIES:   Allergies  Allergen Reactions  . Amlodipine Other (See Comments)    Reaction:  Numbness in arms     VITALS:  Blood pressure 167/111, pulse 84, temperature 98.7 F (37.1 C), temperature source Oral, resp. rate 18, height 5\' 9"  (1.753 m), weight 95.255 kg (210  lb), SpO2 100 %.  PHYSICAL EXAMINATION:  GENERAL:  39 y.o.-year-old patient lying in the bed with no acute distress.  EYES: Pupils equal, round, reactive to light and accommodation. No scleral icterus. Extraocular muscles intact.  HEENT: Head atraumatic, normocephalic. Oropharynx and nasopharynx clear.  NECK:  Supple, no jugular venous distention. No thyroid enlargement, no tenderness.  LUNGS: Normal breath sounds bilaterally, no wheezing, rales,rhonchi or crepitation. No use of accessory muscles of respiration.  CARDIOVASCULAR: S1, S2 normal. No murmurs, rubs, or gallops.  ABDOMEN: Mild epigastric tenderness present no rebound tenderness present NEUROLOGIC: Cranial nerves II through XII are intact. Muscle strength 5/5 in all extremities. Sensation intact. Gait not checked.  PSYCHIATRIC: The patient is alert and oriented x 3.  SKIN: No obvious rash, lesion, or ulcer.    LABORATORY PANEL:   CBC  Recent Labs Lab 03/03/15 0514  WBC 7.8  HGB 14.3  HCT 43.2  PLT 248   ------------------------------------------------------------------------------------------------------------------  Chemistries   Recent Labs Lab 03/03/15 0514  NA 135  K 3.7  CL 104  CO2 24  GLUCOSE 107*  BUN 7  CREATININE 0.77  CALCIUM 9.0   ------------------------------------------------------------------------------------------------------------------  Cardiac Enzymes No results for input(s): TROPONINI in the last 168 hours. ------------------------------------------------------------------------------------------------------------------  RADIOLOGY:  Dg Abd Acute W/chest  03/02/2015  CLINICAL DATA:  Acute onset of intermittent nausea and vomiting. Initial encounter. EXAM: DG ABDOMEN ACUTE W/ 1V CHEST COMPARISON:  CT of the abdomen and pelvis from 02/01/2015 FINDINGS: The lungs are well-aerated and clear. There is no evidence of focal opacification, pleural effusion or pneumothorax. The  cardiomediastinal silhouette is within normal limits. The visualized bowel gas  pattern is unremarkable. Scattered stool and air are seen within the colon; there is no evidence of small bowel dilatation to suggest obstruction. A small amount of fluid and air is seen within the stomach. Postoperative change is noted at the left mid abdomen. No free intra-abdominal air is identified on the provided upright view. No acute osseous abnormalities are seen; the sacroiliac joints are unremarkable in appearance. IMPRESSION: 1. Unremarkable bowel gas pattern; no free intra-abdominal air seen. Small amount of fluid and air noted within the stomach. 2. No acute cardiopulmonary process seen. Electronically Signed   By: Garald Balding M.D.   On: 03/02/2015 01:25    EKG:   Orders placed or performed during the hospital encounter of 02/01/15  . ED EKG  . ED EKG  . EKG 12-Lead  . EKG 12-Lead  . Repeat EKG  . Repeat EKG  . EKG 12-Lead  . EKG 12-Lead    ASSESSMENT AND PLAN:   1. chronic abdominal pain or nausea secondary to constipation: Advised to continue fibre  richj  food  vegetables intake, MiraLAX,d/c narcotic ,    started on Linzess  2. Nausea; Use Zofran as needed. Continue IV hydration for today. Vascular: continue Phenergan  as needed for nausea. Continue clear liquids. If nausea persists consider HIDA scan for tomorrow. #3 mild hypokalemia replace the potassium. Possible narcotic seeking behavior.  Discharge tomorrow morning so he can follow-up with his colorectal surgeon on Monday.  All the records are reviewed and case discussed with Care Management/Social Workerr. Management plans discussed with the patient, family and they are in agreement.  CODE STATUS:full  TOTAL TIME TAKING CARE OF THIS PATIENT: 35 minutes.   POSSIBLE D/C IN  1 DAY, DEPENDING ON CLINICAL CONDITION.   Epifanio Lesches M.D on 03/03/2015 at 11:41 AM  Between 7am to 6pm - Pager - [phone]  After 6pm go to  [email or web address] - password [email or web address] [facility]  Office  [phone]  CC: Primary care physician; Madelyn Brunner, MD   Note: This dictation was prepared with Dragon dictation along with smaller phrase technology. Any transcriptional errors that result from this process are unintentional.

## 2015-03-03 NOTE — Progress Notes (Addendum)
Pt with elevated BP even after re check. Md notified and ordered 25mg  hydralizine.

## 2015-03-03 NOTE — Progress Notes (Signed)
39 yr old male with chronic constipation, nausea, vomiting and abdominal pain after Sigmoid resection for diverticulitis. Patient states did have one episode of vomiting and nausea last night but thinks it was around the time he got morphine.  He stated that the morphine stopped his pain last night.  He did have bowel movements and feels slightly improved from that.  Discussed with him that not an infectious process but if he is having that much pain may be a stricture either from anastomosis or residual diverticular disease.    Filed Vitals:   03/02/15 2129 03/03/15 0450  BP: 154/90 167/111  Pulse: 75 84  Temp: 98.7 F (37.1 C) 98.7 F (37.1 C)  Resp:  18   I/O last 3 completed shifts: In: 3662 [P.O.:640; I.V.:3022] Out: 1600 [Urine:1500; Emesis/NG output:100] Total I/O In: 787.5 [I.V.:787.5] Out: 450 [Urine:450]  PE:  Gen: NAD Res: CTAB/L  Cardio: RRR Abd: soft, non-distended, non-tender Ext: no edema  CBC Latest Ref Rng 03/03/2015 03/01/2015 02/02/2015  WBC 3.8 - 10.6 K/uL 7.8 7.7 8.5  Hemoglobin 13.0 - 18.0 g/dL 14.3 14.6 12.8(L)  Hematocrit 40.0 - 52.0 % 43.2 44.3 39.1(L)  Platelets 150 - 440 K/uL 248 256 243   CMP Latest Ref Rng 03/03/2015 03/01/2015 02/02/2015  Glucose 65 - 99 mg/dL 107(H) 143(H) 104(H)  BUN 6 - 20 mg/dL 7 8 13   Creatinine 0.61 - 1.24 mg/dL 0.77 0.73 0.89  Sodium 135 - 145 mmol/L 135 138 139  Potassium 3.5 - 5.1 mmol/L 3.7 3.4(L) 3.4(L)  Chloride 101 - 111 mmol/L 104 104 103  CO2 22 - 32 mmol/L 24 25 28   Calcium 8.9 - 10.3 mg/dL 9.0 9.2 8.7(L)  Total Protein 6.5 - 8.1 g/dL - - 6.6  Total Bilirubin 0.3 - 1.2 mg/dL - - 1.0  Alkaline Phos 38 - 126 U/L - - 66  AST 15 - 41 U/L - - 19  ALT 17 - 63 U/L - - 23    A/P: 39 yr old male with chronic constipation, abdominal pain and nausea.   Chronic Constipation: continue good bowel regimen with water intake, fiber, miralax, lactulose and Linzess  Chronic N/V abdominal pain: can be from diverticular  disease, constipation or potentially a stricture, needs f/u with colorectal specialist and/or colonoscopy from GI as an outpatient

## 2015-03-04 ENCOUNTER — Inpatient Hospital Stay (HOSPITAL_COMMUNITY)
Admission: EM | Admit: 2015-03-04 | Discharge: 2015-03-06 | DRG: 392 | Disposition: A | Discharge status: Home / Self Care | Payer: Managed Care, Other (non HMO) | Attending: Internal Medicine | Admitting: Internal Medicine

## 2015-03-04 ENCOUNTER — Encounter: Payer: Self-pay | Admitting: Gastroenterology

## 2015-03-04 ENCOUNTER — Encounter (HOSPITAL_COMMUNITY): Payer: Self-pay | Admitting: Family Medicine

## 2015-03-04 DIAGNOSIS — M13871 Other specified arthritis, right ankle and foot: Secondary | ICD-10-CM | POA: Diagnosis present

## 2015-03-04 DIAGNOSIS — R112 Nausea with vomiting, unspecified: Principal | ICD-10-CM | POA: Diagnosis present

## 2015-03-04 DIAGNOSIS — Z87891 Personal history of nicotine dependence: Secondary | ICD-10-CM

## 2015-03-04 DIAGNOSIS — M13872 Other specified arthritis, left ankle and foot: Secondary | ICD-10-CM | POA: Diagnosis present

## 2015-03-04 DIAGNOSIS — K589 Irritable bowel syndrome without diarrhea: Secondary | ICD-10-CM | POA: Diagnosis present

## 2015-03-04 DIAGNOSIS — R1110 Vomiting, unspecified: Secondary | ICD-10-CM | POA: Diagnosis present

## 2015-03-04 DIAGNOSIS — Z79899 Other long term (current) drug therapy: Secondary | ICD-10-CM

## 2015-03-04 DIAGNOSIS — I10 Essential (primary) hypertension: Secondary | ICD-10-CM | POA: Diagnosis present

## 2015-03-04 LAB — COMPREHENSIVE METABOLIC PANEL
ALBUMIN: 4.4 g/dL (ref 3.5–5.0)
ALK PHOS: 104 U/L (ref 38–126)
ALT: 19 U/L (ref 17–63)
ANION GAP: 11 (ref 5–15)
AST: 18 U/L (ref 15–41)
BUN: 13 mg/dL (ref 6–20)
CALCIUM: 9.5 mg/dL (ref 8.9–10.3)
CHLORIDE: 101 mmol/L (ref 101–111)
CO2: 22 mmol/L (ref 22–32)
Creatinine, Ser: 1.04 mg/dL (ref 0.61–1.24)
GFR calc Af Amer: >60 mL/min (ref >60)
GFR calc non Af Amer: >60 mL/min (ref >60)
GLUCOSE: 106 mg/dL — AB (ref 65–99)
Potassium: 4.3 mmol/L (ref 3.5–5.1)
SODIUM: 134 mmol/L — AB (ref 135–145)
Total Bilirubin: 1.4 mg/dL — ABNORMAL HIGH (ref 0.3–1.2)
Total Protein: 7.7 g/dL (ref 6.5–8.1)

## 2015-03-04 LAB — LIPASE, BLOOD: Lipase: 20 U/L (ref 11–51)

## 2015-03-04 LAB — URINALYSIS, ROUTINE W REFLEX MICROSCOPIC
BILIRUBIN URINE: NEGATIVE
GLUCOSE, UA: NEGATIVE mg/dL
HGB URINE DIPSTICK: NEGATIVE
Ketones, ur: 40 mg/dL — AB
Leukocytes, UA: NEGATIVE
Nitrite: NEGATIVE
PH: 5.5 (ref 5.0–8.0)
Protein, ur: NEGATIVE mg/dL
SPECIFIC GRAVITY, URINE: 1.027 (ref 1.005–1.030)

## 2015-03-04 LAB — CBC
HEMATOCRIT: 45.1 % (ref 39.0–52.0)
HEMOGLOBIN: 15.5 g/dL (ref 13.0–17.0)
MCH: 28.6 pg (ref 26.0–34.0)
MCHC: 34.4 g/dL (ref 30.0–36.0)
MCV: 83.2 fL (ref 78.0–100.0)
Platelets: 258 10*3/uL (ref 150–400)
RBC: 5.42 MIL/uL (ref 4.22–5.81)
RDW: 12.5 % (ref 11.5–15.5)
WBC: 8.7 10*3/uL (ref 4.0–10.5)

## 2015-03-04 MED ORDER — POLYETHYLENE GLYCOL 3350 17 G PO PACK: 17.0000 g | PACK | Freq: Every day | ORAL | Status: DC

## 2015-03-04 MED ORDER — LOSARTAN POTASSIUM-HCTZ 100-25 MG PO TABS: 1.0000 | ORAL_TABLET | Freq: Every day | ORAL | Status: AC

## 2015-03-04 MED ORDER — SODIUM CHLORIDE 0.9 % IV BOLUS (SEPSIS)
1000.0000 mL | Freq: Once | INTRAVENOUS | Status: AC
  Administered 2015-03-04: 1000 mL via INTRAVENOUS

## 2015-03-04 MED ORDER — LINACLOTIDE 145 MCG PO CAPS: 145.0000 ug | ORAL_CAPSULE | Freq: Every day | ORAL | Status: DC

## 2015-03-04 MED ORDER — PROMETHAZINE HCL 50 MG PO TABS: 50.0000 mg | ORAL_TABLET | Freq: Four times a day (QID) | ORAL | Status: DC | PRN

## 2015-03-04 MED ORDER — ONDANSETRON HCL 4 MG/2ML IJ SOLN
4.0000 mg | Freq: Once | INTRAMUSCULAR | Status: AC
  Administered 2015-03-04: 4 mg via INTRAVENOUS
  Filled 2015-03-04: qty 2

## 2015-03-04 MED ORDER — HYDROMORPHONE HCL 1 MG/ML IJ SOLN
1.0000 mg | Freq: Once | INTRAMUSCULAR | Status: AC
  Administered 2015-03-04: 1 mg via INTRAVENOUS
  Filled 2015-03-04: qty 1

## 2015-03-04 MED ORDER — LACTULOSE 10 GM/15ML PO SOLN: 30.0000 g | Freq: Two times a day (BID) | ORAL | Status: DC

## 2015-03-04 MED ORDER — ONDANSETRON HCL 4 MG PO TABS
8.0000 mg | ORAL_TABLET | Freq: Once | ORAL | Status: AC
  Administered 2015-03-04: 8 mg via ORAL
  Filled 2015-03-04: qty 2

## 2015-03-04 NOTE — Discharge Summary (Signed)
Edward Harris, is a 39 y.o. male  DOB 04-23-1975  MRN [id number].  Admission date:  03/01/2015  Admitting Physician  Saundra Shelling, MD  Discharge Date:  03/04/2015   Primary MD  Madelyn Brunner, MD  Recommendations for primary care physician for things to follow:  Follow up with her colorectal surgeon today at 3 PM  follow up with Dr. Gilford Rile his primary doctor in 1 week.   Admission Diagnosis  Generalized abdominal pain [R10.84] Refractory nausea and vomiting [R11.2]   Discharge Diagnosis  Generalized abdominal pain [R10.84] Refractory nausea and vomiting [R11.2]    Active Problems:   Nausea & vomiting   Chronic constipation   Refractory nausea and vomiting      Past Medical History  Diagnosis Date  . Diverticulitis   . Hypertension   . IBS (irritable bowel syndrome)   . Nephrolithiasis   . Arthritis     ankles  . Wears contact lenses     Past Surgical History  Procedure Laterality Date  . Inguinal hernia repair Right   . Kidney stone surgery      extraction/lithotripsy  . Esophagogastroduodenoscopy (egd) with propofol N/A 10/03/2014    Procedure: ESOPHAGOGASTRODUODENOSCOPY (EGD) WITH PROPOFOL;  Surgeon: Lucilla Lame, MD;  Location: [place];  Service: Endoscopy;  Laterality: N/A;  . Colonoscopy with propofol N/A 10/03/2014    Procedure: COLONOSCOPY WITH PROPOFOL;  Surgeon: Lucilla Lame, MD;  Location: [place];  Service: Endoscopy;  Laterality: N/A;  . Laparoscopic sigmoid colectomy N/A 10/19/2014    Procedure: LAPAROSCOPIC ASSISTED  SIGMOID COLECTOMY;  Surgeon: Marlyce Huge, MD;  Location: [facility];  Service: General;  Laterality: N/A;  . Colon resection  10/30/2014       History of present illness and  Hospital Course:     Kindly see H&P for history of present  illness and admission details, please review complete Labs, Consult reports and Test reports for all details in brief  HPI  from the history and physical done on the day of admission 39 year old male patient with history of diverticular disease, hyperlipidemia, hypertension comes in because of abdominal pain, or nausea. Patient also has constipation. Patient had a laparotomy and partial colectomy for diverticular disease in July 2016.   Hospital Course  1 abdominal pain nausea and also vomiting found  to have constipation and symptoms are secondary to constipation rather than intractable abdominal process. Patient did not have fever, CBC, BMPwithin normal limits. Seen by surgery Dr. Azalee Course ,she recommended increasing the dietary fiber intake,  MiraLAX. We also added lactulose, name. Patient received IV hydration. He still has a little nausea but he is able to tolerate food. We discontinued the narcotics and also advised the patient that narcotics can make the constipation worse. Advised the patient to stop oxycodone at discharge. Patient has appointment with colorectal specialist today at [facility]. Patient is eager  to go home. Requesting prescription for Phenergan.  2. htn;: Patient says he ran out of bp  Medications.; Advised the patient to continue her medications losartan/ HCTZ. And also give prescription for 1 month.   Discharge Condition: Stable   Follow UP   Colorectal surgeon at [facility] today   Discharge Instructions  and  Discharge Medications        Medication List    STOP taking these medications        oxycodone 5 MG capsule  Commonly known as:  OXY-IR     promethazine 6.25 MG/5ML syrup  Commonly known as:  PHENERGAN  Replaced by:  promethazine 50 MG tablet      TAKE these medications        lactulose 10 GM/15ML solution  Commonly known as:  CHRONULAC  Take 45 mLs (30 g total) by mouth 2 (two) times daily.     Linaclotide 145 MCG Caps capsule  Commonly  known as:  LINZESS  Take 1 capsule (145 mcg total) by mouth daily.     losartan-hydrochlorothiazide 100-25 MG tablet  Commonly known as:  HYZAAR  Take 1 tablet by mouth daily.     polyethylene glycol packet  Commonly known as:  MIRALAX / GLYCOLAX  Take 17 g by mouth daily.     polyethylene glycol packet  Commonly known as:  MIRALAX / GLYCOLAX  Take 17 g by mouth daily.     promethazine 50 MG tablet  Commonly known as:  PHENERGAN  Take 1 tablet (50 mg total) by mouth every 6 (six) hours as needed for nausea or vomiting.          Diet and Activity recommendation: See Discharge Instructions above   Consults obtained - surgery   Major procedures and Radiology Reports - PLEASE review detailed and final reports for all details, in brief -      Dg Abd Acute W/chest  03/02/2015  CLINICAL DATA:  Acute onset of intermittent nausea and vomiting. Initial encounter. EXAM: DG ABDOMEN ACUTE W/ 1V CHEST COMPARISON:  CT of the abdomen and pelvis from 02/01/2015 FINDINGS: The lungs are well-aerated and clear. There is no evidence of focal opacification, pleural effusion or pneumothorax. The cardiomediastinal silhouette is within normal limits. The visualized bowel gas pattern is unremarkable. Scattered stool and air are seen within the colon; there is no evidence of small bowel dilatation to suggest obstruction. A small amount of fluid and air is seen within the stomach. Postoperative change is noted at the left mid abdomen. No free intra-abdominal air is identified on the provided upright view. No acute osseous abnormalities are seen; the sacroiliac joints are unremarkable in appearance. IMPRESSION: 1. Unremarkable bowel gas pattern; no free intra-abdominal air seen. Small amount of fluid and air noted within the stomach. 2. No acute cardiopulmonary process seen. Electronically Signed   By: Garald Balding M.D.   On: 03/02/2015 01:25    Micro Results  No results found for this or any previous  visit (from the past 240 hour(s)).     Today   Subjective:   Edward Harris today has no headache,no chest abdominal pain,no new weakness tingling or numbness, feels much better wants to go home today.  Objective:   Blood pressure 163/105, pulse 81, temperature 99 F (37.2 C), temperature source Oral, resp. rate 18, height 5\' 9"  (1.753 m), weight 95.255 kg (210 lb), SpO2 99 %.   Intake/Output Summary (Last 24 hours) at 03/04/15 0842 Last data filed at 03/04/15 0000  Gross per 24 hour  Intake 2775.5 ml  Output    450 ml  Net 2325.5 ml    Exam Awake Alert, Oriented x 3, No new F.N deficits, Normal affect [place].AT,PERRAL Supple Neck,No JVD, No cervical lymphadenopathy appriciated.  Symmetrical Chest wall movement, Good air movement bilaterally, CTAB RRR,No Gallops,Rubs or new Murmurs, No Parasternal Heave +ve B.Sounds, Abd Soft, Non tender, No organomegaly appriciated, No rebound -guarding or rigidity. No Cyanosis, Clubbing or edema, No new Rash or bruise  Data Review   CBC w Diff: Lab Results  Component Value Date  WBC 7.8 03/03/2015   WBC 21.4* 07/23/2014   HGB 14.3 03/03/2015   HGB 15.0 07/23/2014   HCT 43.2 03/03/2015   HCT 46.3 07/23/2014   PLT 248 03/03/2015   PLT 226 07/23/2014   LYMPHOPCT 14 03/01/2015   LYMPHOPCT 4.1 07/23/2014   MONOPCT 4 03/01/2015   MONOPCT 3.6 07/23/2014   EOSPCT 0 03/01/2015   EOSPCT 0.0 07/23/2014   BASOPCT 1 03/01/2015   BASOPCT 0.8 07/23/2014    CMP: Lab Results  Component Value Date   NA 135 03/03/2015   NA 136 07/23/2014   K 3.7 03/03/2015   K 4.1 07/23/2014   CL 104 03/03/2015   CL 101 07/23/2014   CO2 24 03/03/2015   CO2 24 07/23/2014   BUN 7 03/03/2015   BUN 18 07/23/2014   CREATININE 0.77 03/03/2015   CREATININE 0.93 07/23/2014   PROT 6.6 02/02/2015   PROT 7.9 07/23/2014   ALBUMIN 3.9 02/02/2015   ALBUMIN 4.5 07/23/2014   BILITOT 1.0 02/02/2015   BILITOT 1.5* 07/23/2014   ALKPHOS 66 02/02/2015   ALKPHOS  83 07/23/2014   AST 19 02/02/2015   AST 29 07/23/2014   ALT 23 02/02/2015   ALT 31 07/23/2014  .   Total Time in preparing paper work, data evaluation and todays exam - 74 minutes  Edward Harris M.D on 03/04/2015 at 8:42 AM    Note: This dictation was prepared with Dragon dictation along with smaller phrase technology. Any transcriptional errors that result from this process are unintentional.

## 2015-03-04 NOTE — ED Notes (Signed)
Dr. Jori Moll informed that patient is requesting pain medication and that he had one episode of emesis after eating applesauce.

## 2015-03-04 NOTE — ED Notes (Signed)
Pt called out using his call bell to report that he only took a few bites of his applesauce and then he vomited it up and he also is requesting more pain medication because his pain is back.

## 2015-03-04 NOTE — ED Provider Notes (Signed)
CSN: [id number]     Arrival date & time 03/04/15  1604 History   First MD Initiated Contact with Patient 03/04/15 1924     Chief Complaint  Patient presents with  . Abdominal Pain  . Nausea  . Emesis     (Consider location/radiation/quality/duration/timing/severity/associated sxs/prior Treatment) HPIPatient is a 39 year old male with past medical history significant for diverticulitis s/p colon resection (7/16), IBS, who presents to the emergency department with nausea and vomiting. Patient was discharged from [facility] earlier this morning, patient was admitted for intractable nausea, vomiting, abdominal pain.  States that he was not tolerating solids and had active vomiting at time of discharge. States that he had an appointment with colorectal surgeon this afternoon and that he was discharged in order to get him to that appointment.  Per the patient, after being evaluated by the colorectal surgeon she recommended evaluation by GI and told him to go to the ED for intractable emesis.  Patient reports diffuse cramping abdominal pain, nonradiating and rates a 6/10. Associated with nausea and vomiting. Denies diarrhea, hematemesis, hemoptysis, rectal bleeding, melena, fevers or chills. Denies recent travel or sick contacts. States this feels like his chronic abdominal pain.  Past Medical History  Diagnosis Date  . Diverticulitis   . Hypertension   . IBS (irritable bowel syndrome)   . Nephrolithiasis   . Arthritis     ankles  . Wears contact lenses    Past Surgical History  Procedure Laterality Date  . Inguinal hernia repair Right   . Kidney stone surgery      extraction/lithotripsy  . Esophagogastroduodenoscopy (egd) with propofol N/A 10/03/2014    Procedure: ESOPHAGOGASTRODUODENOSCOPY (EGD) WITH PROPOFOL;  Surgeon: Lucilla Lame, MD;  Location: [place];  Service: Endoscopy;  Laterality: N/A;  . Colonoscopy with propofol N/A 10/03/2014    Procedure: COLONOSCOPY WITH  PROPOFOL;  Surgeon: Lucilla Lame, MD;  Location: [place];  Service: Endoscopy;  Laterality: N/A;  . Laparoscopic sigmoid colectomy N/A 10/19/2014    Procedure: LAPAROSCOPIC ASSISTED  SIGMOID COLECTOMY;  Surgeon: Marlyce Huge, MD;  Location: [facility];  Service: General;  Laterality: N/A;  . Colon resection  10/30/2014   Family History  Problem Relation Age of Onset  . Hypertension Mother   . Osteoarthritis Mother   . Heart attack Father   . Hypertension Brother   . Alzheimer's disease Maternal Aunt   . Lung cancer    . Colon cancer Maternal Grandmother 75   Social History  Substance Use Topics  . Smoking status: Former Research scientist (life sciences)  . Smokeless tobacco: Never Used  . Alcohol Use: 0.0 oz/week    0 Standard drinks or equivalent per week     Comment: "occassional" couple drinks per month    Review of Systems  Constitutional: Negative for fever, appetite change and fatigue.  HENT: Negative for congestion and trouble swallowing.   Eyes: Negative for visual disturbance.  Respiratory: Negative for chest tightness and shortness of breath.   Cardiovascular: Negative for chest pain and palpitations.  Gastrointestinal: Positive for nausea, vomiting and abdominal pain. Negative for blood in stool.  Genitourinary: Negative for dysuria, hematuria, flank pain and decreased urine volume.  Musculoskeletal: Negative for back pain.  Skin: Negative for rash.  Neurological: Negative for dizziness, seizures, weakness, light-headedness and headaches.  Psychiatric/Behavioral: Negative for behavioral problems.      Allergies  Amlodipine  Home Medications   Prior to Admission medications   Medication Sig Start Date End Date Taking? Authorizing Provider  lactulose (CHRONULAC) 10 GM/15ML solution Take 45 mLs (30 g total) by mouth 2 (two) times daily. 03/04/15  Yes Epifanio Lesches, MD  Linaclotide (LINZESS) 145 MCG CAPS capsule Take 1 capsule (145 mcg total) by mouth daily. 03/04/15   Yes Epifanio Lesches, MD  losartan-hydrochlorothiazide (HYZAAR) 100-25 MG tablet Take 1 tablet by mouth daily. 03/04/15  Yes Epifanio Lesches, MD  polyethylene glycol (MIRALAX / GLYCOLAX) packet Take 17 g by mouth daily. 02/04/15  Yes Marlyce Huge, MD  promethazine (PHENERGAN) 50 MG tablet Take 1 tablet (50 mg total) by mouth every 6 (six) hours as needed for nausea or vomiting. 03/04/15  Yes Epifanio Lesches, MD  polyethylene glycol (MIRALAX / GLYCOLAX) packet Take 17 g by mouth daily. Patient not taking: Reported on 03/04/2015 03/04/15   Epifanio Lesches, MD   BP 161/88 mmHg  Pulse 65  Temp(Src) 99.1 F (37.3 C) (Oral)  Resp 18  Ht 5\' 9"  (1.753 m)  Wt 97.7 kg  BMI 31.79 kg/m2  SpO2 99% Physical Exam  Constitutional: He is oriented to person, place, and time. He appears well-developed and well-nourished.  HENT:  Head: Normocephalic and atraumatic.  Mouth/Throat: Oropharynx is clear and moist.  Eyes: Conjunctivae and EOM are normal. Pupils are equal, round, and reactive to light.  Neck: Normal range of motion.  Cardiovascular: Normal rate, regular rhythm, normal heart sounds and intact distal pulses.   Pulmonary/Chest: Effort normal and breath sounds normal. No respiratory distress. He has no wheezes. He has no rales.  Abdominal: Soft. He exhibits no distension and no ascites. There is generalized tenderness. There is no rigidity, no rebound, no CVA tenderness, no tenderness at McBurney's point and negative Murphy's sign. No hernia.  Musculoskeletal: Normal range of motion.  Neurological: He is alert and oriented to person, place, and time.  Skin: Skin is warm.  Psychiatric: He has a normal mood and affect.  Nursing note and vitals reviewed.   ED Course  Procedures (including critical care time) Labs Review Labs Reviewed  COMPREHENSIVE METABOLIC PANEL - Abnormal; Notable for the following:    Sodium 134 (*)    Glucose, Bld 106 (*)    Total Bilirubin 1.4  (*)    All other components within normal limits  URINALYSIS, ROUTINE W REFLEX MICROSCOPIC (NOT AT [facility]) - Abnormal; Notable for the following:    Color, Urine AMBER (*)    Ketones, ur 40 (*)    All other components within normal limits  LIPASE, BLOOD  CBC  URINE RAPID DRUG SCREEN, HOSP PERFORMED  COMPREHENSIVE METABOLIC PANEL  CBC WITH DIFFERENTIAL/PLATELET  LIPASE, BLOOD    Imaging Review No results found. I have personally reviewed and evaluated these images and lab results as part of my medical decision-making.   EKG Interpretation None      MDM   Final diagnoses:  Nausea & vomiting   Patient is a 39 year old male with past medical history significant for diverticulitis s/p colon resection (7/16), IBS, who presents to the emergency department with nausea and vomiting. Afebrile, hemodynamically stable. Patient is not toxic appearing, appears clinically dehydrated. Exam as above, notable for soft abdomen, diffuse tenderness to palpation, no signs of peritonitis, no abdominal distention.  Patient with normal bowel movement this morning. Patient has had multiple CT abdomen and pelvis in the past 6 months. Do not feel the patient needs another CT abdomen and pelvis to evaluate for SBO given his normal bowel movements this morning. Passing flatus.  Given IV fluid, antiemetics, pain medication.  Lab work including CBC, CMP, lipase, UA were unremarkable. Patient required multiple rounds of antiemetics. Attempted to tolerate by mouth, had multiple episodes of emesis. Patient will be admitted to medicine for intractable nausea and vomiting. Patient is stable for the floor. Transferred to the floor in stable condition.    Nathaniel Man, MD 03/05/15 [id number]  Debby Freiberg, MD 03/08/15 [phone]

## 2015-03-04 NOTE — ED Notes (Signed)
Pt. was given a [place] sandwich and applesauce.

## 2015-03-04 NOTE — ED Notes (Signed)
Pt here for N,V and abd pain. sts released from [facility] this am the see surgeon. sts was told to come back to the ER due to still N,V and unable to hold down food or fluids.

## 2015-03-04 NOTE — Progress Notes (Signed)
Pt to be discharged per MD order. Iv removed. Instructions reviewed with pt and all questions answered. Scripts submitted electronically. Will be taken out in wheelchair

## 2015-03-05 ENCOUNTER — Inpatient Hospital Stay (HOSPITAL_COMMUNITY): Payer: Managed Care, Other (non HMO)

## 2015-03-05 ENCOUNTER — Encounter (HOSPITAL_COMMUNITY): Payer: Self-pay | Admitting: Internal Medicine

## 2015-03-05 DIAGNOSIS — G43A0 Cyclical vomiting, not intractable: Secondary | ICD-10-CM | POA: Diagnosis not present

## 2015-03-05 DIAGNOSIS — I10 Essential (primary) hypertension: Secondary | ICD-10-CM | POA: Diagnosis present

## 2015-03-05 DIAGNOSIS — R1084 Generalized abdominal pain: Secondary | ICD-10-CM | POA: Diagnosis not present

## 2015-03-05 DIAGNOSIS — R1114 Bilious vomiting: Secondary | ICD-10-CM

## 2015-03-05 DIAGNOSIS — Z87891 Personal history of nicotine dependence: Secondary | ICD-10-CM | POA: Diagnosis not present

## 2015-03-05 DIAGNOSIS — G43A1 Cyclical vomiting, intractable: Secondary | ICD-10-CM | POA: Diagnosis not present

## 2015-03-05 DIAGNOSIS — R1110 Vomiting, unspecified: Secondary | ICD-10-CM | POA: Diagnosis not present

## 2015-03-05 DIAGNOSIS — K589 Irritable bowel syndrome without diarrhea: Secondary | ICD-10-CM | POA: Diagnosis present

## 2015-03-05 DIAGNOSIS — R112 Nausea with vomiting, unspecified: Secondary | ICD-10-CM | POA: Diagnosis present

## 2015-03-05 DIAGNOSIS — M13872 Other specified arthritis, left ankle and foot: Secondary | ICD-10-CM | POA: Diagnosis present

## 2015-03-05 DIAGNOSIS — M13871 Other specified arthritis, right ankle and foot: Secondary | ICD-10-CM | POA: Diagnosis present

## 2015-03-05 DIAGNOSIS — Z79899 Other long term (current) drug therapy: Secondary | ICD-10-CM | POA: Diagnosis not present

## 2015-03-05 LAB — COMPREHENSIVE METABOLIC PANEL
ALK PHOS: 79 U/L (ref 38–126)
ALT: 17 U/L (ref 17–63)
AST: 13 U/L — ABNORMAL LOW (ref 15–41)
Albumin: 3.5 g/dL (ref 3.5–5.0)
Anion gap: 6 (ref 5–15)
BUN: 13 mg/dL (ref 6–20)
CALCIUM: 9 mg/dL (ref 8.9–10.3)
CO2: 26 mmol/L (ref 22–32)
CREATININE: 1.01 mg/dL (ref 0.61–1.24)
Chloride: 104 mmol/L (ref 101–111)
Glucose, Bld: 88 mg/dL (ref 65–99)
Potassium: 3.5 mmol/L (ref 3.5–5.1)
Sodium: 136 mmol/L (ref 135–145)
Total Bilirubin: 1.5 mg/dL — ABNORMAL HIGH (ref 0.3–1.2)
Total Protein: 6.3 g/dL — ABNORMAL LOW (ref 6.5–8.1)

## 2015-03-05 LAB — CBC WITH DIFFERENTIAL/PLATELET
BASOS PCT: 0 %
Basophils Absolute: 0 10*3/uL (ref 0.0–0.1)
EOS ABS: 0.1 10*3/uL (ref 0.0–0.7)
EOS PCT: 1 %
HCT: 38.3 % — ABNORMAL LOW (ref 39.0–52.0)
HEMOGLOBIN: 13 g/dL (ref 13.0–17.0)
Lymphocytes Relative: 43 %
Lymphs Abs: 3.6 10*3/uL (ref 0.7–4.0)
MCH: 28.4 pg (ref 26.0–34.0)
MCHC: 33.9 g/dL (ref 30.0–36.0)
MCV: 83.8 fL (ref 78.0–100.0)
MONOS PCT: 6 %
Monocytes Absolute: 0.5 10*3/uL (ref 0.1–1.0)
NEUTROS PCT: 50 %
Neutro Abs: 4 10*3/uL (ref 1.7–7.7)
PLATELETS: 231 10*3/uL (ref 150–400)
RBC: 4.57 MIL/uL (ref 4.22–5.81)
RDW: 12.6 % (ref 11.5–15.5)
WBC: 8.2 10*3/uL (ref 4.0–10.5)

## 2015-03-05 LAB — LIPASE, BLOOD: LIPASE: 23 U/L (ref 11–51)

## 2015-03-05 LAB — RAPID URINE DRUG SCREEN, HOSP PERFORMED
Amphetamines: NOT DETECTED
BENZODIAZEPINES: NOT DETECTED
Barbiturates: NOT DETECTED
COCAINE: NOT DETECTED
OPIATES: POSITIVE — AB
TETRAHYDROCANNABINOL: NOT DETECTED

## 2015-03-05 MED ORDER — METOCLOPRAMIDE HCL 5 MG/ML IJ SOLN
10.0000 mg | Freq: Four times a day (QID) | INTRAMUSCULAR | Status: DC
  Administered 2015-03-05 – 2015-03-06 (×3): 10 mg via INTRAVENOUS
  Filled 2015-03-05 (×3): qty 2

## 2015-03-05 MED ORDER — ACETAMINOPHEN 650 MG RE SUPP: 650.0000 mg | Freq: Four times a day (QID) | RECTAL | Status: DC | PRN

## 2015-03-05 MED ORDER — LOSARTAN POTASSIUM 50 MG PO TABS
100.0000 mg | ORAL_TABLET | Freq: Every day | ORAL | Status: DC
  Filled 2015-03-05: qty 2

## 2015-03-05 MED ORDER — PROMETHAZINE HCL 25 MG RE SUPP: 25.0000 mg | Freq: Four times a day (QID) | RECTAL | Status: DC | PRN

## 2015-03-05 MED ORDER — KETOROLAC TROMETHAMINE 15 MG/ML IJ SOLN
15.0000 mg | Freq: Four times a day (QID) | INTRAMUSCULAR | Status: DC | PRN
  Administered 2015-03-05: 15 mg via INTRAVENOUS
  Filled 2015-03-05: qty 1

## 2015-03-05 MED ORDER — ONDANSETRON HCL 4 MG/2ML IJ SOLN
4.0000 mg | Freq: Four times a day (QID) | INTRAMUSCULAR | Status: DC | PRN
  Administered 2015-03-05 (×2): 4 mg via INTRAVENOUS
  Filled 2015-03-05 (×2): qty 2

## 2015-03-05 MED ORDER — ACETAMINOPHEN 325 MG PO TABS
650.0000 mg | ORAL_TABLET | Freq: Four times a day (QID) | ORAL | Status: DC | PRN
  Filled 2015-03-05: qty 2

## 2015-03-05 MED ORDER — POTASSIUM CHLORIDE IN NACL 40-0.9 MEQ/L-% IV SOLN
INTRAVENOUS | Status: AC
  Administered 2015-03-05 (×2): 75 mL/h via INTRAVENOUS
  Filled 2015-03-05 (×2): qty 1000

## 2015-03-05 MED ORDER — HYDRALAZINE HCL 20 MG/ML IJ SOLN
10.0000 mg | INTRAMUSCULAR | Status: DC | PRN
  Filled 2015-03-05: qty 1

## 2015-03-05 MED ORDER — BOOST / RESOURCE BREEZE PO LIQD
1.0000 | Freq: Three times a day (TID) | ORAL | Status: DC
  Administered 2015-03-05 – 2015-03-06 (×2): 1 via ORAL

## 2015-03-05 MED ORDER — POLYETHYLENE GLYCOL 3350 17 G PO PACK
17.0000 g | PACK | Freq: Every day | ORAL | Status: DC
  Filled 2015-03-05: qty 1

## 2015-03-05 MED ORDER — SODIUM CHLORIDE 0.9 % IV SOLN
INTRAVENOUS | Status: DC
  Administered 2015-03-05 (×2): via INTRAVENOUS

## 2015-03-05 MED ORDER — POLYETHYLENE GLYCOL 3350 17 G PO PACK
17.0000 g | PACK | Freq: Two times a day (BID) | ORAL | Status: DC
  Administered 2015-03-05: 17 g via ORAL
  Filled 2015-03-05 (×2): qty 1

## 2015-03-05 MED ORDER — METOCLOPRAMIDE HCL 5 MG/ML IJ SOLN
10.0000 mg | Freq: Four times a day (QID) | INTRAMUSCULAR | Status: DC | PRN
  Administered 2015-03-05: 10 mg via INTRAVENOUS
  Filled 2015-03-05: qty 2

## 2015-03-05 MED ORDER — ONDANSETRON HCL 4 MG PO TABS: 4.0000 mg | ORAL_TABLET | Freq: Four times a day (QID) | ORAL | Status: DC | PRN

## 2015-03-05 MED ORDER — PANTOPRAZOLE SODIUM 40 MG PO TBEC: 40.0000 mg | DELAYED_RELEASE_TABLET | Freq: Every day | ORAL | Status: DC

## 2015-03-05 MED ORDER — ENOXAPARIN SODIUM 40 MG/0.4ML ~~LOC~~ SOLN
40.0000 mg | SUBCUTANEOUS | Status: DC
  Filled 2015-03-05 (×2): qty 0.4

## 2015-03-05 MED ORDER — LACTULOSE 10 GM/15ML PO SOLN
30.0000 g | Freq: Two times a day (BID) | ORAL | Status: DC
  Filled 2015-03-05: qty 45

## 2015-03-05 MED ORDER — IOHEXOL 300 MG/ML  SOLN
100.0000 mL | Freq: Once | INTRAMUSCULAR | Status: AC | PRN
  Administered 2015-03-05: 100 mL via INTRAVENOUS

## 2015-03-05 MED ORDER — LINACLOTIDE 145 MCG PO CAPS
145.0000 ug | ORAL_CAPSULE | Freq: Every day | ORAL | Status: DC
  Administered 2015-03-06: 145 ug via ORAL
  Filled 2015-03-05 (×2): qty 1

## 2015-03-05 MED ORDER — GI COCKTAIL ~~LOC~~
30.0000 mL | Freq: Three times a day (TID) | ORAL | Status: DC
  Administered 2015-03-05 – 2015-03-06 (×4): 30 mL via ORAL
  Filled 2015-03-05 (×7): qty 30

## 2015-03-05 MED ORDER — PANTOPRAZOLE SODIUM 40 MG IV SOLR
40.0000 mg | Freq: Two times a day (BID) | INTRAVENOUS | Status: DC
  Administered 2015-03-05 (×2): 40 mg via INTRAVENOUS
  Filled 2015-03-05 (×2): qty 40

## 2015-03-05 MED ORDER — BARIUM SULFATE 2.1 % PO SUSP
450.0000 mL | Freq: Two times a day (BID) | ORAL | Status: DC
  Administered 2015-03-05: 450 mL via ORAL

## 2015-03-05 MED ORDER — MORPHINE SULFATE (PF) 4 MG/ML IV SOLN
4.0000 mg | Freq: Once | INTRAVENOUS | Status: AC
  Administered 2015-03-05: 4 mg via INTRAVENOUS
  Filled 2015-03-05: qty 1

## 2015-03-05 NOTE — H&P (Signed)
[facility] History and Physical  Edward Harris [id number] DOB: 1975/09/04 DOA: 03/04/2015  Referring physician: Dr. Jori Moll. PCP: Madelyn Brunner, MD  Specialists: Patient follows up at [facility] center.  Chief Complaint: Nausea vomiting.  HPI: Edward Harris is a 39 y.o. male with history of hypertension was had surgery for diverticulitis in July of this year at [facility] center presents to the ER because of nausea and vomiting. Patient also has epigastric discomfort. Patient was discharged from [facility] yesterday morning and had appointment with surgeon at [facility]. Patient does not recall the name. Patient states he was advised to follow up with gastroenterologist. Because of persistent nausea vomiting patient came to the ER. Patient stating he is unable to keep in anything. Patient wasn't [facility] admitted last 3 days. At that time patient was evaluated by patient's surgeon and was placed on medications for constipation. Despite which patient still has persistent nausea vomiting. On my exam. His abdomen is soft nontender and no guarding or rigidity. Since patient has been having persistent nausea vomiting since his surgery in July and was instructed for further workup to come to [facility] patient has been admitted for further management.   Review of Systems: As presented in the history of presenting illness, rest negative.  Past Medical History  Diagnosis Date  . Diverticulitis   . Hypertension   . IBS (irritable bowel syndrome)   . Nephrolithiasis   . Arthritis     ankles  . Wears contact lenses    Past Surgical History  Procedure Laterality Date  . Inguinal hernia repair Right   . Kidney stone surgery      extraction/lithotripsy  . Esophagogastroduodenoscopy (egd) with propofol N/A 10/03/2014    Procedure: ESOPHAGOGASTRODUODENOSCOPY (EGD) WITH PROPOFOL;  Surgeon: Lucilla Lame, MD;  Location: [place];  Service: Endoscopy;  Laterality: N/A;   . Colonoscopy with propofol N/A 10/03/2014    Procedure: COLONOSCOPY WITH PROPOFOL;  Surgeon: Lucilla Lame, MD;  Location: [place];  Service: Endoscopy;  Laterality: N/A;  . Laparoscopic sigmoid colectomy N/A 10/19/2014    Procedure: LAPAROSCOPIC ASSISTED  SIGMOID COLECTOMY;  Surgeon: Marlyce Huge, MD;  Location: [facility];  Service: General;  Laterality: N/A;  . Colon resection  10/30/2014   Social History:  reports that he has quit smoking. He has never used smokeless tobacco. He reports that he drinks alcohol. He reports that he does not use illicit drugs. Where does patient live home. Can patient participate in ADLs? Yes.  Allergies  Allergen Reactions  . Amlodipine Other (See Comments)    Reaction:  Numbness in arms     Family History:  Family History  Problem Relation Age of Onset  . Hypertension Mother   . Osteoarthritis Mother   . Heart attack Father   . Hypertension Brother   . Alzheimer's disease Maternal Aunt   . Lung cancer    . Colon cancer Maternal Grandmother 75      Prior to Admission medications   Medication Sig Start Date End Date Taking? Authorizing Provider  lactulose (CHRONULAC) 10 GM/15ML solution Take 45 mLs (30 g total) by mouth 2 (two) times daily. 03/04/15  Yes Epifanio Lesches, MD  Linaclotide (LINZESS) 145 MCG CAPS capsule Take 1 capsule (145 mcg total) by mouth daily. 03/04/15  Yes Epifanio Lesches, MD  losartan-hydrochlorothiazide (HYZAAR) 100-25 MG tablet Take 1 tablet by mouth daily. 03/04/15  Yes Epifanio Lesches, MD  polyethylene glycol (MIRALAX / GLYCOLAX) packet Take 17 g by mouth  daily. 02/04/15  Yes Marlyce Huge, MD  promethazine (PHENERGAN) 50 MG tablet Take 1 tablet (50 mg total) by mouth every 6 (six) hours as needed for nausea or vomiting. 03/04/15  Yes Epifanio Lesches, MD  polyethylene glycol (MIRALAX / GLYCOLAX) packet Take 17 g by mouth daily. Patient not taking: Reported on 03/04/2015 03/04/15    Epifanio Lesches, MD    Physical Exam: Filed Vitals:   03/04/15 2200 03/04/15 2215 03/05/15 0001 03/05/15 0041  BP: 133/81 147/96 143/102 161/88  Pulse: 63 69 78 65  Temp:    99.1 F (37.3 C)  TempSrc:    Oral  Resp:    18  Height:    5\' 9"  (1.753 m)  Weight:    97.7 kg (215 lb 6.2 oz)  SpO2: 97% 98% 100% 99%     General:  Moderately built and nourished.  Eyes: Anicteric no pallor.  ENT: No discharge from the ears eyes nose and mouth.  Neck: No mass felt.  Cardiovascular: S1-S2 heard.  Respiratory: No rhonchi or crepitations.  Abdomen: Soft nontender bowel sounds present. No guarding or rigidity.  Skin: No rash.  Musculoskeletal: No edema.  Psychiatric: Appears normal.  Neurologic: Alert awake oriented to time place and person. Moves all extremities.  Labs on Admission:  Basic Metabolic Panel:  Recent Labs Lab 03/01/15 2254 03/03/15 0514 03/04/15 1643  NA 138 135 134*  K 3.4* 3.7 4.3  CL 104 104 101  CO2 25 24 22   GLUCOSE 143* 107* 106*  BUN 8 7 13   CREATININE 0.73 0.77 1.04  CALCIUM 9.2 9.0 9.5   Liver Function Tests:  Recent Labs Lab 03/04/15 1643  AST 18  ALT 19  ALKPHOS 104  BILITOT 1.4*  PROT 7.7  ALBUMIN 4.4    Recent Labs Lab 03/01/15 2254 03/04/15 1643  LIPASE 94* 20   No results for input(s): AMMONIA in the last 168 hours. CBC:  Recent Labs Lab 03/01/15 2254 03/03/15 0514 03/04/15 1643  WBC 7.7 7.8 8.7  NEUTROABS 6.3  --   --   HGB 14.6 14.3 15.5  HCT 44.3 43.2 45.1  MCV 84.5 85.2 83.2  PLT 256 248 258   Cardiac Enzymes: No results for input(s): CKTOTAL, CKMB, CKMBINDEX, TROPONINI in the last 168 hours.  BNP (last 3 results) No results for input(s): BNP in the last 8760 hours.  ProBNP (last 3 results) No results for input(s): PROBNP in the last 8760 hours.  CBG: No results for input(s): GLUCAP in the last 168 hours.  Radiological Exams on Admission: No results found.   Assessment/Plan Principal  Problem:   Intractable vomiting Active Problems:   Nausea & vomiting   Hypertension, uncontrolled   1. Intractable nausea vomiting - cause not clear. At this time I have ordered repeat CT abdomen and pelvis. Patient will need gastroenterology input in a.m. For now I have Patient on clear liquid diet. Gentle hydration. Check urine drug screen. 2. Hypertension uncontrolled - probably from intractable vomiting. In addition to patient's home medications I have placed patient on when necessary IV hydralazine.  I have reviewed patient's old charts were labs.   DVT Prophylaxis Lovenox.  Code Status: Full code.  Family Communication: Discussed with patient.  Disposition Plan: Admit to inpatient.    Jamarion [place] N. [facility] Pager [phone].  If 7PM-7AM, please contact night-coverage [email or web address] Password [email or web address] 03/05/2015, 1:46 AM

## 2015-03-05 NOTE — Progress Notes (Signed)
Initial Nutrition Assessment  DOCUMENTATION CODES:   Obesity unspecified  INTERVENTION:   -Boost Breeze po TID, each supplement provides 250 kcal and 9 grams of protein  NUTRITION DIAGNOSIS:   Inadequate oral intake related to altered GI function as evidenced by meal completion < 50%.  GOAL:   Patient will meet greater than or equal to 90% of their needs  MONITOR:   PO intake, Supplement acceptance, Diet advancement, Labs, Weight trends, Skin, I & O's  REASON FOR ASSESSMENT:   Malnutrition Screening Tool    ASSESSMENT:   Edward Harris is a 39 y.o. male with history of hypertension was had surgery for diverticulitis in July of this year at [facility] center presents to the ER because of nausea and vomiting. Patient also has epigastric discomfort. Patient was discharged from [facility] yesterday morning and had appointment with surgeon at [facility] Patient does not recall the name. Patient states he was advised to follow up with gastroenterologist. Because of persistent nausea vomiting patient came to the ER. Patient stating he is unable to keep in anything. Patient wasn't [facility] admitted last 3 days. At that time patient was evaluated by patient's surgeon and was placed on medications for constipation. Despite which patient still has persistent nausea vomiting. On my exam. His abdomen is soft nontender and no guarding or rigidity. Since patient has been having persistent nausea vomiting since his surgery in July and was instructed for further workup to come to [facility] patient has been admitted for further management.   Pt admitted with intractable nausea and vomiting.   Attempted to visit pt x 2, however, pt in with other providers at times of visits.   Reviewed GI note. Pt has had multiple hospitalizations for diverticulitis at [facility]. GI is awaiting CT scan results to determine need for repeat EGD or repeat colonoscopy vs gastric emptying study.   Reviewed RD notes from  previous admissions. NFPE completed on 02/02/15; no signs of fat or muscle depletion noted on exam. Unable to complete Nutrition-Focused physical exam at this time, however, suspect no changes to exam at this time.  Reviewed wt hx. Wt has ranged between 200-215# within the past 6 months. No weight changes significant for time frame. Wt has been stable over the past 3 months.  Intake of clear liquid diet poor; noted less than 50% meal completion of breakfast tray. RD will add Boost Breeze supplement for additional nutrition support.   Labs reviewed.   Diet Order:  Diet clear liquid Room service appropriate?: Yes; Fluid consistency:: Thin  Skin:  Reviewed, no issues  Last BM:  03/04/15  Height:   Ht Readings from Last 1 Encounters:  03/05/15 5\' 9"  (1.753 m)    Weight:   Wt Readings from Last 1 Encounters:  03/05/15 215 lb 6.2 oz (97.7 kg)    Ideal Body Weight:  72.7 kg  BMI:  Body mass index is 31.79 kg/(m^2).  Estimated Nutritional Needs:   Kcal:  1800-2000  Protein:  80-95 grams  Fluid:  1.8-2.0 L  EDUCATION NEEDS:   Education needs no appropriate at this time  Eris Hannan A. Jimmye Norman, RD, LDN, CDE Pager: [phone] After hours Pager: [phone]

## 2015-03-05 NOTE — Consult Note (Signed)
[facility] Consult: 10:26 AM 03/05/2015  LOS: 0 days    Referring Provider: Dr Candiss Norse Primary Care Physician:  Madelyn Brunner, MD in [place] Primary Gastroenterologist: Arther Dames, MD in [place]. Has also seen Lucilla Lame, MD in [place].    At the current [facility] clinic gen surg: Dr Marlyce Huge.    Reason for Consultation:  Nausea, vomiting   HPI: Edward Harris is a 39 y.o. male.  S/p right inguinal hernia repair.  S/p 07/2014 left ureteroscopy and lithotripsy.   S/p 10/19/2014 laparoscopic sigmoid resection for diverticulitis.  Pathology: ruptured diverticulitis with mural abscess and serositis.  5 admits since 10/2014 for GI issues: pain , N/V. One in 10/2014 for perianastomotic inflammation treated with abx. 10/28 - 11/12 with pain and CT 10/28: improved perianastomotic inflammation, extensive diverticulosis in remaining colon. Oxycodone discontinued and treated with laxatives.    Patient states the start of his GI troubles back to his honeymoon in May 2015. First she noticed anorexia. This progressed to nausea and vomiting. Over the course of the next many months he had 2 or 3 admissions and was treated for diverticulitis.   Eventually he underwent the sigmoid resection in 10/2014. This did not result in significant improvement as he had recurrent bouts of generalized abdominal pain with nausea and vomiting of material resembling oatmeal. Never vomited blood or coffee grounds.  Does not recall ever taking PPI therapy, and definitely hasn't been using this in many months if at all  Admission in late 01/2015 to [facility] of [facility] CT scan of the abdomen 10/15 as well as small bowel follow-through 10/20.  Transit time and appearance of the small bowel was normal. CT: Uncomplicated  descending colon diverticulitis in the region of previous resection but no drainable fluid collection or abscess. Prominent loop of small bowel likely reflecting developing ileus from adjacent inflammation.  He was treated with IV Cipro/Flagyl and was to complete a total of a 3 week course of these (10/15 - 11/16). They recommended follow-up colonoscopy in 6-8 weeks.  After this hospitalization he never took further oxycodone or any other forms of narcotic  11/25 - 11/28 [place] admission with pain, n/v.  He was given a diagnosis of constipation, though he was having 1-2 bowel movements daily at home. Abdominal films did not demonstrate significant stool burden. Nonetheless his symptoms improved with aggressive laxative therapy including addition of Linzess and lactulose.  discharged in order to keep appointment with general surgeon in [place].  Had initial OV with Dr Leighton Ruff on [id number].  She told pt to folow up with Dr Allen Norris, that he probably needed a repeat endoscopic/colonoscopic evaluation. .   Presented and readmitted from ED at [facility] with recurrent N/V yesterday, after visit to Dr. Manon Hilding office.  Abd xray unremarkable.  Labs with T bil 1.5 but otherwise normal LFTs.  WBCs normal. U/A normal.  A CT scan abdomen has been ordered. The patient is wondering if he is going to be able to drink the oral contrast. Today his abdominal pain is improved  but he has persistent nausea.  Colonoscopy 09/2014:  Diverticulosis:  descending/transverse, ascending, sigmoid.  Sigmoid polyps removed (path: hyperplastic). Nonspecific mild sigmoid inflammation, not biopsied.  Non bleeding int rrhoids.   EGD 09/2014 for N/V.   Impression:Z-line irregular, at the gastroesophageal junction.  Gastritis: biopsied (path: mild, chronic gastritis.  No H Pylori).  Normal examined duodenum.     Past Medical History  Diagnosis Date  . Diverticulitis   . Hypertension   . IBS (irritable bowel syndrome)   . Nephrolithiasis     . Arthritis     ankles  . Wears contact lenses     Past Surgical History  Procedure Laterality Date  . Inguinal hernia repair Right   . Kidney stone surgery      extraction/lithotripsy  . Esophagogastroduodenoscopy (egd) with propofol N/A 10/03/2014    Procedure: ESOPHAGOGASTRODUODENOSCOPY (EGD) WITH PROPOFOL;  Surgeon: Lucilla Lame, MD;  Location: [place];  Service: Endoscopy;  Laterality: N/A;  . Colonoscopy with propofol N/A 10/03/2014    Procedure: COLONOSCOPY WITH PROPOFOL;  Surgeon: Lucilla Lame, MD;  Location: [place];  Service: Endoscopy;  Laterality: N/A;  . Laparoscopic sigmoid colectomy N/A 10/19/2014    Procedure: LAPAROSCOPIC ASSISTED  SIGMOID COLECTOMY;  Surgeon: Marlyce Huge, MD;  Location: [facility];  Service: General;  Laterality: N/A;  . Colon resection  10/30/2014    Prior to Admission medications   Medication Sig Start Date End Date Taking? Authorizing Provider  lactulose (CHRONULAC) 10 GM/15ML solution Take 45 mLs (30 g total) by mouth 2 (two) times daily. 03/04/15  Yes Epifanio Lesches, MD  Linaclotide (LINZESS) 145 MCG CAPS capsule Take 1 capsule (145 mcg total) by mouth daily. 03/04/15  Yes Epifanio Lesches, MD  losartan-hydrochlorothiazide (HYZAAR) 100-25 MG tablet Take 1 tablet by mouth daily. 03/04/15  Yes Epifanio Lesches, MD  polyethylene glycol (MIRALAX / GLYCOLAX) packet Take 17 g by mouth daily. 02/04/15  Yes Marlyce Huge, MD  promethazine (PHENERGAN) 50 MG tablet Take 1 tablet (50 mg total) by mouth every 6 (six) hours as needed for nausea or vomiting. 03/04/15  Yes Epifanio Lesches, MD  polyethylene glycol (MIRALAX / GLYCOLAX) packet Take 17 g by mouth daily. Patient not taking: Reported on 03/04/2015 03/04/15   Epifanio Lesches, MD    Scheduled Meds: . enoxaparin (LOVENOX) injection  40 mg Subcutaneous Q24H  . gi cocktail  30 mL Oral TID  . Linaclotide  145 mcg Oral Daily  . pantoprazole  40 mg  Oral Daily  . polyethylene glycol  17 g Oral BID   Infusions: . 0.9 % NaCl with KCl 40 mEq / L     PRN Meds: acetaminophen **OR** [DISCONTINUED] acetaminophen, hydrALAZINE, ketorolac, [DISCONTINUED] ondansetron **OR** ondansetron (ZOFRAN) IV   Allergies as of 03/04/2015 - Review Complete 03/04/2015  Allergen Reaction Noted  . Amlodipine Other (See Comments) 08/19/2014    Family History  Problem Relation Age of Onset  . Hypertension Mother   . Osteoarthritis Mother   . Heart attack Father   . Hypertension Brother   . Alzheimer's disease Maternal Aunt   . Lung cancer    . Colon cancer Maternal Grandmother 75    Social History   Social History  . Marital Status: Married    Spouse Name: N/A  . Number of Children: 4  . Years of Education: N/A   Occupational History  . Deliver sheet rock    Social History Main Topics  . Smoking status: Former Research scientist (life sciences)  . Smokeless tobacco: Never  Used  . Alcohol Use: 0.0 oz/week    0 Standard drinks or equivalent per week     Comment: "occassional" couple drinks per month  . Drug Use: No  . Sexual Activity: Not on file   Other Topics Concern  . Not on file   Social History Narrative   Lives with wife & children    REVIEW OF SYSTEMS: Constitutional:  Weight loss of 28 pounds in the last year ENT:  No nose bleeds Pulm:  No cough or dyspnea CV:  No palpitations, no LE edema.  GU:  No hematuria, no frequency GI:  Per gi Heme:  No issues with low blood counts, excessive bleeding or excessive bruising   Transfusions:  None ever Neuro:  No headaches, no peripheral tingling or numbness Derm:  No itching, no rash or sores.  Endocrine:  No sweats or chills.  No polyuria or dysuria Immunization:  Did not inquire Travel:  None beyond local counties in last few months.    PHYSICAL EXAM: Vital signs in last 24 hours: Filed Vitals:   03/05/15 0041 03/05/15 0516  BP: 161/88 137/84  Pulse: 65 62  Temp: 99.1 F (37.3 C) 97.5 F (36.4  C)  Resp: 18 19   Wt Readings from Last 3 Encounters:  03/05/15 97.7 kg (215 lb 6.2 oz)  03/01/15 95.255 kg (210 lb)  02/08/15 97.523 kg (215 lb)    General:  Pleasant, overweight AAM. He is comfortable and doesn't look ill Head:  No swelling. No facial asymmetry  Eyes:  No scleral icterus. No conjunctival pallor. Ears:  Not hard of hearing.  Nose:  No congestion. No discharge. Mouth:  Moist, clear, dentition in good. Neck:  No mass, no TMG, no JVD. Lungs:  Clear to auscultation and percussion bilaterally. No cough. No dyspnea. Heart:  RRR. No MRG. S1/S2 audible Abdomen:  Soft.  NT, ND.  No HSM.  Bowel sounds hypoactive but present. No tingling or tympanitic bowel sounds..   Rectal:  Deferred   Musc/Skeltl:  No joint contractures, swelling or erythema. Extremities:  No CCE.  Neurologic:  Alert. Oriented 3. No limb weakness, no tremor. No gross neurologic deficits. Skin:  No sores or visible rashes. Tattoos:  1 on the right arm. Nodes:  No cervical or inguinal adenopathy.   Psych:  Cooperative, calm, pleasant.  Intake/Output from previous day: 11/28 0701 - 11/29 0700 In: 120 [P.O.:120] Out: -  Intake/Output this shift:    LAB RESULTS:  Recent Labs  03/03/15 0514 03/04/15 1643 03/05/15 0701  WBC 7.8 8.7 8.2  HGB 14.3 15.5 13.0  HCT 43.2 45.1 38.3*  PLT 248 258 231   BMET Lab Results  Component Value Date   NA 136 03/05/2015   NA 134* 03/04/2015   NA 135 03/03/2015   K 3.5 03/05/2015   K 4.3 03/04/2015   K 3.7 03/03/2015   CL 104 03/05/2015   CL 101 03/04/2015   CL 104 03/03/2015   CO2 26 03/05/2015   CO2 22 03/04/2015   CO2 24 03/03/2015   GLUCOSE 88 03/05/2015   GLUCOSE 106* 03/04/2015   GLUCOSE 107* 03/03/2015   BUN 13 03/05/2015   BUN 13 03/04/2015   BUN 7 03/03/2015   CREATININE 1.01 03/05/2015   CREATININE 1.04 03/04/2015   CREATININE 0.77 03/03/2015   CALCIUM 9.0 03/05/2015   CALCIUM 9.5 03/04/2015   CALCIUM 9.0 03/03/2015    LFT  Recent Labs  03/04/15 1643 03/05/15 0701  PROT 7.7 6.3*  ALBUMIN 4.4 3.5  AST 18 13*  ALT 19 17  ALKPHOS 104 79  BILITOT 1.4* 1.5*   PT/INR No results found for: INR, PROTIME Hepatitis Panel No results for input(s): HEPBSAG, HCVAB, HEPAIGM, HEPBIGM in the last 72 hours. C-Diff No components found for: CDIFF Lipase     Component Value Date/Time   LIPASE 23 03/05/2015 0701    Drugs of Abuse  No results found for: LABOPIA, COCAINSCRNUR, LABBENZ, AMPHETMU, THCU, LABBARB   RADIOLOGY STUDIES: Dg Abd Portable 1v  03/05/2015  CLINICAL DATA:  Mid abdominal pain with nausea and vomiting. EXAM: PORTABLE ABDOMEN - 1 VIEW COMPARISON:  Abdominal series on 03/02/2015 FINDINGS: No evidence of bowel obstruction. No abnormal calcifications. Bony structures are unremarkable. IMPRESSION: Negative. Electronically Signed   By: Aletta Edouard M.D.   On: 03/05/2015 07:57    ENDOSCOPIC STUDIES: Per HPI  IMPRESSION:   *    Near chronic abdominal pain, nausea vomiting in patient with history of diverticulitis. Symptoms date back to May/2015.  Underwent sigmoid colectomy for perforated diverticulitis in 10/2013. However he continues to have recurrent bouts of the above symptoms. Gastritis on EGD 09/2014. Not on PPI Nonspecific colonic inflammation noted on colonoscopy in 09/2014. This was not biopsied.  It may have represented residual diverticulitis. Symptoms lately have been attributed to constipation, however he has regular bowel movements with the use of MiraLAX at home. Linzess was added during recent hospitalization ending yesterday.  Last treated for acute diverticulitis in mid October during hospitalization at [facility]. CT scan is ordered, this may help [place] determine whether or not he is having any ongoing diverticulitis.    PLAN:     *  Per Dr. Fuller Plan. Await CT scan findings before determining whether or not he needs repeat EGD or colonoscopy. Other tests  could include gastric emptying study. Will add Reglan, this may help him keep down the oral contrast required for CT scan.   Azucena Freed  03/05/2015, 10:26 AM Pager: [phone]     Attending physician's note   I have taken a history, examined the patient and reviewed the chart. I agree with the Advanced Practitioner's note, impression and recommendations. Recurrent N/V/abdominal pain, etiology unclear. History of complicated diverticulitis and sigmoid colon resection in 10/2014 at [facility]. History of constipation. He has had an extensive evaluation including EGD and colonoscopy in 09/2014 at [facility], CT scans, SBFT.  R/O gastroparesis, functional. Repeat abd/pelvic CT is ordered. Consider GES and Head CT. Start PPI bid and Reglan qid (not prn) for now. Phenergan and Zofran prn for now.  Minimize/avoid narcotics. Pt has appt scheduled for GI follow up with Dr. Arther Dames in [place].   Lucio Edward, MD [facility] [phone] Mon-Fri 8a-5p [phone] after 5p, weekends, holidays

## 2015-03-05 NOTE — Progress Notes (Signed)
Patient Demographics:    Edward Harris, is a 39 y.o. male, DOB - January 24, 1976, [id number]  Admit date - 03/04/2015   Admitting Physician Rise Patience, MD  Outpatient Primary MD for the patient is Sarina Ser, Hewitt Blade, MD  LOS - 0   Chief Complaint  Patient presents with  . Abdominal Pain  . Nausea  . Emesis        Subjective:    [name] today has, No headache, No chest pain, No abdominal pain - mild Nausea, No new weakness tingling or numbness, No Cough - SOB.     Assessment  & Plan :     1. Persistent nausea vomiting for the last 6 months after colon resection for diverticulitis in July. Recent admission and negative workup at [facility] 2 days ago, seen by general surgery in [facility] on 03/04/2015 with outpatient GI follow-up as recommendation. Continues to have nausea, KUB unremarkable, no significant stool burden at this time. Continue bowel regimen to avoid constipation, placed on GI cocktail along with PPI, obtain CT scan abdomen and pelvis, CMP and lipase unremarkable. GI requested to evaluate for possible EGD and or gastric emptying if needed. Continue supportive care for now.   2. Essential hypertension. As needed IV hydralazine.    Code Status : Full   Family Communication  : None present   Disposition Plan  : Home 1-2 days  Consults  :  GI  Procedures  : KUB unremarkable, CT scan abdomen and pelvis pending.  DVT Prophylaxis  :  Lovenox    Lab Results  Component Value Date   PLT 231 03/05/2015    Inpatient Medications  Scheduled Meds: . enoxaparin (LOVENOX) injection  40 mg Subcutaneous Q24H  . gi cocktail  30 mL Oral TID  . lactulose  30 g Oral BID  . Linaclotide  145 mcg Oral Daily  . pantoprazole  40 mg Oral Daily  . polyethylene glycol  17 g  Oral Daily   Continuous Infusions: . sodium chloride 100 mL/hr at 03/05/15 0850   PRN Meds:.acetaminophen **OR** acetaminophen, hydrALAZINE, ketorolac, [DISCONTINUED] ondansetron **OR** ondansetron (ZOFRAN) IV  Antibiotics  :     Anti-infectives    None        Objective:   Filed Vitals:   03/04/15 2215 03/05/15 0001 03/05/15 0041 03/05/15 0516  BP: 147/96 143/102 161/88 137/84  Pulse: 69 78 65 62  Temp:   99.1 F (37.3 C) 97.5 F (36.4 C)  TempSrc:   Oral Oral  Resp:   18 19  Height:   5\' 9"  (1.753 m)   Weight:   97.7 kg (215 lb 6.2 oz)   SpO2: 98% 100% 99% 98%    Wt Readings from Last 3 Encounters:  03/05/15 97.7 kg (215 lb 6.2 oz)  03/01/15 95.255 kg (210 lb)  02/08/15 97.523 kg (215 lb)     Intake/Output Summary (Last 24 hours) at 03/05/15 1005 Last data filed at 03/05/15 0518  Gross per 24 hour  Intake    120 ml  Output      0 ml  Net    120 ml     Physical Exam  Awake Alert, Oriented X 3, No new F.N deficits, Normal affect [place].AT,PERRAL Supple  Neck,No JVD, No cervical lymphadenopathy appriciated.  Symmetrical Chest wall movement, Good air movement bilaterally, CTAB RRR,No Gallops,Rubs or new Murmurs, No Parasternal Heave +ve B.Sounds, Abd Soft, No tenderness, No organomegaly appriciated, No rebound - guarding or rigidity. No Cyanosis, Clubbing or edema, No new Rash or bruise      Data Review:   Micro Results No results found for this or any previous visit (from the past 240 hour(s)).  Radiology Reports Dg Abd Acute W/chest  03/02/2015  CLINICAL DATA:  Acute onset of intermittent nausea and vomiting. Initial encounter. EXAM: DG ABDOMEN ACUTE W/ 1V CHEST COMPARISON:  CT of the abdomen and pelvis from 02/01/2015 FINDINGS: The lungs are well-aerated and clear. There is no evidence of focal opacification, pleural effusion or pneumothorax. The cardiomediastinal silhouette is within normal limits. The visualized bowel gas pattern is unremarkable.  Scattered stool and air are seen within the colon; there is no evidence of small bowel dilatation to suggest obstruction. A small amount of fluid and air is seen within the stomach. Postoperative change is noted at the left mid abdomen. No free intra-abdominal air is identified on the provided upright view. No acute osseous abnormalities are seen; the sacroiliac joints are unremarkable in appearance. IMPRESSION: 1. Unremarkable bowel gas pattern; no free intra-abdominal air seen. Small amount of fluid and air noted within the stomach. 2. No acute cardiopulmonary process seen. Electronically Signed   By: Garald Balding M.D.   On: 03/02/2015 01:25   Dg Abd Portable 1v  03/05/2015  CLINICAL DATA:  Mid abdominal pain with nausea and vomiting. EXAM: PORTABLE ABDOMEN - 1 VIEW COMPARISON:  Abdominal series on 03/02/2015 FINDINGS: No evidence of bowel obstruction. No abnormal calcifications. Bony structures are unremarkable. IMPRESSION: Negative. Electronically Signed   By: Aletta Edouard M.D.   On: 03/05/2015 07:57     CBC  Recent Labs Lab 03/01/15 2254 03/03/15 0514 03/04/15 1643 03/05/15 0701  WBC 7.7 7.8 8.7 8.2  HGB 14.6 14.3 15.5 13.0  HCT 44.3 43.2 45.1 38.3*  PLT 256 248 258 231  MCV 84.5 85.2 83.2 83.8  MCH 27.9 28.3 28.6 28.4  MCHC 33.0 33.2 34.4 33.9  RDW 13.1 13.3 12.5 12.6  LYMPHSABS 1.1  --   --  3.6  MONOABS 0.3  --   --  0.5  EOSABS 0.0  --   --  0.1  BASOSABS 0.0  --   --  0.0    Chemistries   Recent Labs Lab 03/01/15 2254 03/03/15 0514 03/04/15 1643 03/05/15 0701  NA 138 135 134* 136  K 3.4* 3.7 4.3 3.5  CL 104 104 101 104  CO2 25 24 22 26   GLUCOSE 143* 107* 106* 88  BUN 8 7 13 13   CREATININE 0.73 0.77 1.04 1.01  CALCIUM 9.2 9.0 9.5 9.0  AST  --   --  18 13*  ALT  --   --  19 17  ALKPHOS  --   --  104 79  BILITOT  --   --  1.4* 1.5*    ------------------------------------------------------------------------------------------------------------------ estimated creatinine clearance is 114.3 mL/min (by C-G formula based on Cr of 1.01). ------------------------------------------------------------------------------------------------------------------ No results for input(s): HGBA1C in the last 72 hours. ------------------------------------------------------------------------------------------------------------------ No results for input(s): CHOL, HDL, LDLCALC, TRIG, CHOLHDL, LDLDIRECT in the last 72 hours. ------------------------------------------------------------------------------------------------------------------ No results for input(s): TSH, T4TOTAL, T3FREE, THYROIDAB in the last 72 hours.  Invalid input(s): FREET3 ------------------------------------------------------------------------------------------------------------------ No results for input(s): VITAMINB12, FOLATE, FERRITIN, TIBC, IRON, RETICCTPCT in the last 72  hours.  Coagulation profile No results for input(s): INR, PROTIME in the last 168 hours.  No results for input(s): DDIMER in the last 72 hours.  Cardiac Enzymes No results for input(s): CKMB, TROPONINI, MYOGLOBIN in the last 168 hours.  Invalid input(s): CK ------------------------------------------------------------------------------------------------------------------ Invalid input(s): POCBNP   Time Spent in minutes  35   SINGH,PRASHANT K M.D on 03/05/2015 at 10:05 AM  Between 7am to 7pm - Pager - [phone]  After 7pm go to [email or web address] - password [facility]  [facility] -  Office  [phone]

## 2015-03-05 NOTE — Telephone Encounter (Signed)
error 

## 2015-03-05 NOTE — Care Management Note (Signed)
Case Management Note  Patient Details  Name: Edward Harris MRN: [id number] Date of Birth: 1975/10/07  Subjective/Objective:                    Action/Plan:  Initial UR completed  Expected Discharge Date:  03/09/15               Expected Discharge Plan:  Home/Self Care  In-House Referral:     Discharge planning Services     Post Acute Care Choice:    Choice offered to:     DME Arranged:    DME Agency:     HH Arranged:    [place] Agency:     Status of Service:  In process, will continue to follow  Medicare Important Message Given:    Date Medicare IM Given:    Medicare IM give by:    Date Additional Medicare IM Given:    Additional Medicare Important Message give by:     If discussed at [place] of Stay Meetings, dates discussed:    Additional Comments:  Marilu Favre, RN 03/05/2015, 8:12 AM

## 2015-03-06 DIAGNOSIS — G43A1 Cyclical vomiting, intractable: Secondary | ICD-10-CM

## 2015-03-06 LAB — COMPREHENSIVE METABOLIC PANEL
ALBUMIN: 3.6 g/dL (ref 3.5–5.0)
ALK PHOS: 81 U/L (ref 38–126)
ALT: 16 U/L — AB (ref 17–63)
AST: 15 U/L (ref 15–41)
Anion gap: 6 (ref 5–15)
BUN: 12 mg/dL (ref 6–20)
CALCIUM: 9 mg/dL (ref 8.9–10.3)
CO2: 27 mmol/L (ref 22–32)
CREATININE: 0.92 mg/dL (ref 0.61–1.24)
Chloride: 102 mmol/L (ref 101–111)
GFR calc non Af Amer: >60 mL/min (ref >60)
GLUCOSE: 85 mg/dL (ref 65–99)
Potassium: 4 mmol/L (ref 3.5–5.1)
SODIUM: 135 mmol/L (ref 135–145)
Total Bilirubin: 1.6 mg/dL — ABNORMAL HIGH (ref 0.3–1.2)
Total Protein: 6.6 g/dL (ref 6.5–8.1)

## 2015-03-06 LAB — MAGNESIUM: Magnesium: 2.2 mg/dL (ref 1.7–2.4)

## 2015-03-06 MED ORDER — PANTOPRAZOLE SODIUM 40 MG PO TBEC: 40.0000 mg | DELAYED_RELEASE_TABLET | Freq: Two times a day (BID) | ORAL | Status: DC

## 2015-03-06 MED ORDER — METOCLOPRAMIDE HCL 5 MG PO TABS
5.0000 mg | ORAL_TABLET | Freq: Three times a day (TID) | ORAL | Status: DC
  Administered 2015-03-06: 5 mg via ORAL
  Filled 2015-03-06: qty 1

## 2015-03-06 MED ORDER — PANTOPRAZOLE SODIUM 40 MG PO TBEC
40.0000 mg | DELAYED_RELEASE_TABLET | Freq: Every day | ORAL | Status: DC
  Administered 2015-03-06: 40 mg via ORAL
  Filled 2015-03-06: qty 1

## 2015-03-06 MED ORDER — ONDANSETRON HCL 4 MG PO TABS: 4.0000 mg | ORAL_TABLET | Freq: Three times a day (TID) | ORAL | Status: DC | PRN

## 2015-03-06 MED ORDER — METOCLOPRAMIDE HCL 5 MG PO TABS: 5.0000 mg | ORAL_TABLET | Freq: Three times a day (TID) | ORAL | Status: DC

## 2015-03-06 NOTE — Discharge Instructions (Signed)
Follow with Primary MD Madelyn Brunner, MD in 7 days   Get CBC, CMP, 2 view Chest X ray checked  by Primary MD next visit.    Activity: As tolerated with Full fall precautions use walker/cane & assistance as needed   Disposition Home     Diet: Heart Healthy   For Heart failure patients - Check your Weight same time everyday, if you gain over 2 pounds, or you develop in leg swelling, experience more shortness of breath or chest pain, call your Primary MD immediately. Follow Cardiac Low Salt Diet and 1.5 lit/day fluid restriction.   On your next visit with your primary care physician please Get Medicines reviewed and adjusted.   Please request your Prim.MD to go over all Hospital Tests and Procedure/Radiological results at the follow up, please get all Hospital records sent to your Prim MD by signing hospital release before you go home.   If you experience worsening of your admission symptoms, develop shortness of breath, life threatening emergency, suicidal or homicidal thoughts you must seek medical attention immediately by calling 911 or calling your MD immediately  if symptoms less severe.  You Must read complete instructions/literature along with all the possible adverse reactions/side effects for all the Medicines you take and that have been prescribed to you. Take any new Medicines after you have completely understood and accpet all the possible adverse reactions/side effects.   Do not drive, operating heavy machinery, perform activities at heights, swimming or participation in water activities or provide baby sitting services if your were admitted for syncope or siezures until you have seen by Primary MD or a Neurologist and advised to do so again.  Do not drive when taking Pain medications.    Do not take more than prescribed Pain, Sleep and Anxiety Medications  Special Instructions: If you have smoked or chewed Tobacco  in the last 2 yrs please stop smoking, stop any  regular Alcohol  and or any Recreational drug use.  Wear Seat belts while driving.   Please note  You were cared for by a hospitalist during your hospital stay. If you have any questions about your discharge medications or the care you received while you were in the hospital after you are discharged, you can call the unit and asked to speak with the hospitalist on call if the hospitalist that took care of you is not available. Once you are discharged, your primary care physician will handle any further medical issues. Please note that NO REFILLS for any discharge medications will be authorized once you are discharged, as it is imperative that you return to your primary care physician (or establish a relationship with a primary care physician if you do not have one) for your aftercare needs so that they can reassess your need for medications and monitor your lab values.

## 2015-03-06 NOTE — Discharge Summary (Signed)
Edward Harris, is a 39 y.o. male  DOB 1976-02-10  MRN [id number].  Admission date:  03/04/2015  Admitting Physician  Rise Patience, MD  Discharge Date:  03/06/2015   Primary MD  Madelyn Brunner, MD  Recommendations for primary care physician for things to follow:   Needs close outpatient GI follow-up will possibly require repeat EGD, colonoscopy along with gastric emptying study. Defer to his primary gastroenterologist.   Admission Diagnosis  emesis   Discharge Diagnosis  emesis     Principal Problem:   Intractable vomiting Active Problems:   Nausea & vomiting   Hypertension, uncontrolled      Past Medical History  Diagnosis Date  . Diverticulitis   . Hypertension   . IBS (irritable bowel syndrome)   . Nephrolithiasis   . Arthritis     ankles  . Wears contact lenses     Past Surgical History  Procedure Laterality Date  . Inguinal hernia repair Right   . Kidney stone surgery      extraction/lithotripsy  . Esophagogastroduodenoscopy (egd) with propofol N/A 10/03/2014    Procedure: ESOPHAGOGASTRODUODENOSCOPY (EGD) WITH PROPOFOL;  Surgeon: Lucilla Lame, MD;  Location: [facility];  Service: Endoscopy;  Laterality: N/A;  . Colonoscopy with propofol N/A 10/03/2014    Procedure: COLONOSCOPY WITH PROPOFOL;  Surgeon: Lucilla Lame, MD;  Location: [facility];  Service: Endoscopy;  Laterality: N/A;  . Laparoscopic sigmoid colectomy N/A 10/19/2014    Procedure: LAPAROSCOPIC ASSISTED  SIGMOID COLECTOMY;  Surgeon: Marlyce Huge, MD;  Location: [facility];  Service: General;  Laterality: N/A;  . Colon resection  10/30/2014       HPI  from the history and physical done on the day of admission:    AVRUMI Harris is a 39 y.o. male with history of hypertension was had surgery  for diverticulitis in July of this year at [facility] center presents to the ER because of nausea and vomiting. Patient also has epigastric discomfort. Patient was discharged from [facility] yesterday morning and had appointment with surgeon at [facility]. Patient does not recall the name. Patient states he was advised to follow up with gastroenterologist. Because of persistent nausea vomiting patient came to the ER. Patient stating he is unable to keep in anything. Patient wasn't [facility] admitted last 3 days. At that time patient was evaluated by patient's surgeon and was placed on medications for constipation. Despite which patient still has persistent nausea vomiting. On my exam. His abdomen is soft nontender and no guarding or rigidity. Since patient has been having persistent nausea vomiting since his surgery in July and was instructed for further workup to come to [facility] patient has been admitted for further management.    Patient states the start of his GI troubles back to his honeymoon in May 2015. First she noticed anorexia. This progressed to nausea and vomiting. Over the course of the next many months he had 2 or 3 admissions and was treated for diverticulitis. Eventually he underwent the sigmoid resection  in 10/2014. This did not result in significant improvement as he had recurrent bouts of generalized abdominal pain with nausea and vomiting of material resembling oatmeal. Never vomited blood or coffee grounds. Does not recall ever taking PPI therapy, and definitely hasn't been using this in many months if at all  Admission in late 01/2015 to [facility] branch of [place]. CT scan of the abdomen 10/15 as well as small bowel follow-through 10/20. Transit time and appearance of the small bowel was normal. CT: Uncomplicated descending colon diverticulitis in the region of previous resection but no drainable fluid collection or abscess. Prominent loop of small bowel likely reflecting  developing ileus from adjacent inflammation. He was treated with IV Cipro/Flagyl and was to complete a total of a 3 week course of these (10/15 - 11/16). They recommended follow-up colonoscopy in 6-8 weeks. After this hospitalization he never took further oxycodone or any other forms of narcotic  11/25 - 11/28 [place] admission with pain, n/v. He was given a diagnosis of constipation, though he was having 1-2 bowel movements daily at home. Abdominal films did not demonstrate significant stool burden. Nonetheless his symptoms improved with aggressive laxative therapy including addition of Linzess and lactulose. discharged in order to keep appointment with general surgeon in [place]. Had initial OV with Dr Leighton Ruff on [id number]. She told pt to folow up with Dr Allen Norris, that he probably needed a repeat endoscopic/colonoscopic evaluation. .   Presented and readmitted from ED at [facility] with recurrent N/V yesterday, after visit to Dr. Manon Hilding office. Abd xray unremarkable.  Labs with T bil 1.5 but otherwise normal LFTs. WBCs normal. U/A normal.  A CT scan abdomen has been ordered. The patient is wondering if he is going to be able to drink the oral contrast. Today his abdominal pain is improved but he has persistent nausea.  Colonoscopy 09/2014: Diverticulosis: descending/transverse, ascending, sigmoid. Sigmoid polyps removed (path: hyperplastic). Nonspecific mild sigmoid inflammation, not biopsied. Non bleeding int rrhoids.   EGD 09/2014 for N/V.   Impression:Z-line irregular, at the gastroesophageal junction. Gastritis: biopsied (path: mild, chronic gastritis. No H Pylori). Normal examined duodenum.    Hospital Course:   1. Persistent nausea vomiting for the last 6 months after colon resection for diverticulitis in July. Recent admission and negative workup at [place] 2 days ago, seen by general surgery in [facility] on 03/04/2015 with outpatient GI follow-up as recommendation.  Underwent KUB and CT scan abdomen and pelvis here which were unremarkable. Seen here by GI, he improved considerably after he was placed on scheduled GI cocktail and PPI. Per GI here he will be discharged on twice a day PPI along with Reglan pre-meal, he already has a pending appointment with his gastroenterologist tomorrow. Further outpatient workup will be deferred to his primary gastroenterologist in [place]. He may benefit from repeat EGD, colonoscopy +/- gastric emptying study.   2. Essential hypertension. Resume home medications.   Discharge Condition: Stable  Follow UP  Follow-up Information    Follow up with Sarina Ser, Hewitt Blade, MD. Schedule an appointment as soon as possible for a visit in 1 week.   Specialty:  Internal Medicine   Contact information:   [place] [id number] [phone]       Follow up with REIN, Grace Blight, MD In 1 day.   Specialty:  Gastroenterology   Contact information:   [facility] [place] [address] [phone]  Consults obtained - GI  Diet and Activity recommendation: See Discharge Instructions below  Discharge Instructions       Discharge Instructions    Diet - low sodium heart healthy    Complete by:  As directed      Discharge instructions    Complete by:  As directed   Follow with Primary MD Madelyn Brunner, MD in 7 days   Get CBC, CMP, 2 view Chest X ray checked  by Primary MD next visit.    Activity: As tolerated with Full fall precautions use walker/cane & assistance as needed   Disposition Home     Diet: Heart Healthy   For Heart failure patients - Check your Weight same time everyday, if you gain over 2 pounds, or you develop in leg swelling, experience more shortness of breath or chest pain, call your Primary MD immediately. Follow Cardiac Low Salt Diet and 1.5 lit/day fluid restriction.   On your next visit with your  primary care physician please Get Medicines reviewed and adjusted.   Please request your Prim.MD to go over all Hospital Tests and Procedure/Radiological results at the follow up, please get all Hospital records sent to your Prim MD by signing hospital release before you go home.   If you experience worsening of your admission symptoms, develop shortness of breath, life threatening emergency, suicidal or homicidal thoughts you must seek medical attention immediately by calling 911 or calling your MD immediately  if symptoms less severe.  You Must read complete instructions/literature along with all the possible adverse reactions/side effects for all the Medicines you take and that have been prescribed to you. Take any new Medicines after you have completely understood and accpet all the possible adverse reactions/side effects.   Do not drive, operating heavy machinery, perform activities at heights, swimming or participation in water activities or provide baby sitting services if your were admitted for syncope or siezures until you have seen by Primary MD or a Neurologist and advised to do so again.  Do not drive when taking Pain medications.    Do not take more than prescribed Pain, Sleep and Anxiety Medications  Special Instructions: If you have smoked or chewed Tobacco  in the last 2 yrs please stop smoking, stop any regular Alcohol  and or any Recreational drug use.  Wear Seat belts while driving.   Please note  You were cared for by a hospitalist during your hospital stay. If you have any questions about your discharge medications or the care you received while you were in the hospital after you are discharged, you can call the unit and asked to speak with the hospitalist on call if the hospitalist that took care of you is not available. Once you are discharged, your primary care physician will handle any further medical issues. Please note that NO REFILLS for any discharge medications  will be authorized once you are discharged, as it is imperative that you return to your primary care physician (or establish a relationship with a primary care physician if you do not have one) for your aftercare needs so that they can reassess your need for medications and monitor your lab values.     Increase activity slowly    Complete by:  As directed              Discharge Medications       Medication List    STOP taking these medications        lactulose  10 GM/15ML solution  Commonly known as:  CHRONULAC     promethazine 50 MG tablet  Commonly known as:  PHENERGAN      TAKE these medications        Linaclotide 145 MCG Caps capsule  Commonly known as:  LINZESS  Take 1 capsule (145 mcg total) by mouth daily.     losartan-hydrochlorothiazide 100-25 MG tablet  Commonly known as:  HYZAAR  Take 1 tablet by mouth daily.     metoCLOPramide 5 MG tablet  Commonly known as:  REGLAN  Take 1 tablet (5 mg total) by mouth 3 (three) times daily before meals.     ondansetron 4 MG tablet  Commonly known as:  ZOFRAN  Take 1 tablet (4 mg total) by mouth every 8 (eight) hours as needed for nausea or vomiting.     pantoprazole 40 MG tablet  Commonly known as:  PROTONIX  Take 1 tablet (40 mg total) by mouth 2 (two) times daily.     polyethylene glycol packet  Commonly known as:  MIRALAX / GLYCOLAX  Take 17 g by mouth daily.     polyethylene glycol packet  Commonly known as:  MIRALAX / GLYCOLAX  Take 17 g by mouth daily.        Major procedures and Radiology Reports - PLEASE review detailed and final reports for all details, in brief -       Ct Abdomen Pelvis W Contrast  03/05/2015  CLINICAL DATA:  Nausea and vomiting since February 28, 2015. EXAM: CT ABDOMEN AND PELVIS WITH CONTRAST TECHNIQUE: Multidetector CT imaging of the abdomen and pelvis was performed using the standard protocol following bolus administration of intravenous contrast. CONTRAST:  166mL OMNIPAQUE  IOHEXOL 300 MG/ML  SOLN COMPARISON:  February 01, 2015 FINDINGS: There is a 2.5 mm low density in posterior right lobe inferior liver unchanged. The liver is otherwise normal. The spleen, pancreas, gallbladder, adrenal glands and kidneys are normal. There is no hydronephrosis bilaterally. The aorta is normal. There is no abdominal lymphadenopathy. There is evidence of prior left colon surgery stable compared to prior exam. The appendix is normal. There is no small bowel obstruction. Fluid-filled bladder is normal. There is evidence of prior right inguinal hernia repair unchanged. There is left inguinal herniation of mesenteric fat unchanged. The lung bases are clear. Mild degenerative joint changes of the lower lumbar spine are noted. IMPRESSION: No acute abnormality identified in the abdomen and pelvis. Evidence of prior left colon surgery stable compared to prior exam. There is no small bowel obstruction. The appendix is normal. Electronically Signed   By: Abelardo Diesel M.D.   On: 03/05/2015 15:02   Dg Abd Acute W/chest  03/02/2015  CLINICAL DATA:  Acute onset of intermittent nausea and vomiting. Initial encounter. EXAM: DG ABDOMEN ACUTE W/ 1V CHEST COMPARISON:  CT of the abdomen and pelvis from 02/01/2015 FINDINGS: The lungs are well-aerated and clear. There is no evidence of focal opacification, pleural effusion or pneumothorax. The cardiomediastinal silhouette is within normal limits. The visualized bowel gas pattern is unremarkable. Scattered stool and air are seen within the colon; there is no evidence of small bowel dilatation to suggest obstruction. A small amount of fluid and air is seen within the stomach. Postoperative change is noted at the left mid abdomen. No free intra-abdominal air is identified on the provided upright view. No acute osseous abnormalities are seen; the sacroiliac joints are unremarkable in appearance. IMPRESSION: 1. Unremarkable bowel gas pattern; no free intra-abdominal  air  seen. Small amount of fluid and air noted within the stomach. 2. No acute cardiopulmonary process seen. Electronically Signed   By: Garald Balding M.D.   On: 03/02/2015 01:25   Dg Abd Portable 1v  03/05/2015  CLINICAL DATA:  Mid abdominal pain with nausea and vomiting. EXAM: PORTABLE ABDOMEN - 1 VIEW COMPARISON:  Abdominal series on 03/02/2015 FINDINGS: No evidence of bowel obstruction. No abnormal calcifications. Bony structures are unremarkable. IMPRESSION: Negative. Electronically Signed   By: Aletta Edouard M.D.   On: 03/05/2015 07:57    Micro Results      No results found for this or any previous visit (from the past 240 hour(s)).     Today   Subjective    Gerardo Ramsier today has no headache,no chest abdominal pain,no new weakness tingling or numbness, feels much better wants to go home today.    Objective   Blood pressure 159/98, pulse 84, temperature 98.9 F (37.2 C), temperature source Oral, resp. rate 18, height 5\' 9"  (1.753 m), weight 97.7 kg (215 lb 6.2 oz), SpO2 99 %.   Intake/Output Summary (Last 24 hours) at 03/06/15 1058 Last data filed at 03/06/15 0820  Gross per 24 hour  Intake 2805.42 ml  Output   2650 ml  Net 155.42 ml    Exam Awake Alert, Oriented x 3, No new F.N deficits, Normal affect [place].AT,PERRAL Supple Neck,No JVD, No cervical lymphadenopathy appriciated.  Symmetrical Chest wall movement, Good air movement bilaterally, CTAB RRR,No Gallops,Rubs or new Murmurs, No Parasternal Heave +ve B.Sounds, Abd Soft, Non tender, No organomegaly appriciated, No rebound -guarding or rigidity. No Cyanosis, Clubbing or edema, No new Rash or bruise   Data Review   CBC w Diff: Lab Results  Component Value Date   WBC 8.2 03/05/2015   WBC 21.4* 07/23/2014   HGB 13.0 03/05/2015   HGB 15.0 07/23/2014   HCT 38.3* 03/05/2015   HCT 46.3 07/23/2014   PLT 231 03/05/2015   PLT 226 07/23/2014   LYMPHOPCT 43 03/05/2015   LYMPHOPCT 4.1 07/23/2014   MONOPCT 6  03/05/2015   MONOPCT 3.6 07/23/2014   EOSPCT 1 03/05/2015   EOSPCT 0.0 07/23/2014   BASOPCT 0 03/05/2015   BASOPCT 0.8 07/23/2014    CMP: Lab Results  Component Value Date   NA 135 03/06/2015   NA 136 07/23/2014   K 4.0 03/06/2015   K 4.1 07/23/2014   CL 102 03/06/2015   CL 101 07/23/2014   CO2 27 03/06/2015   CO2 24 07/23/2014   BUN 12 03/06/2015   BUN 18 07/23/2014   CREATININE 0.92 03/06/2015   CREATININE 0.93 07/23/2014   PROT 6.6 03/06/2015   PROT 7.9 07/23/2014   ALBUMIN 3.6 03/06/2015   ALBUMIN 4.5 07/23/2014   BILITOT 1.6* 03/06/2015   BILITOT 1.5* 07/23/2014   ALKPHOS 81 03/06/2015   ALKPHOS 83 07/23/2014   AST 15 03/06/2015   AST 29 07/23/2014   ALT 16* 03/06/2015   ALT 31 07/23/2014  .   Total Time in preparing paper work, data evaluation and todays exam - 35 minutes  Thurnell Lose M.D on 03/06/2015 at 10:58 AM  [facility]   Office  [phone]

## 2015-03-06 NOTE — Progress Notes (Signed)
Daily Rounding Note  03/06/2015, 9:29 AM  LOS: 1 day   SUBJECTIVE:       Slept well.  No nausea overnight or this AM.  Currently tolertating clears.  No BM.   OBJECTIVE:         Vital signs in last 24 hours:    Temp:  [98.9 F (37.2 C)-99.6 F (37.6 C)] 98.9 F (37.2 C) (11/30 0611) Pulse Rate:  [76-84] 84 (11/30 0611) Resp:  [18] 18 (11/30 0611) BP: (147-159)/(84-98) 159/98 mmHg (11/30 0611) SpO2:  [99 %-100 %] 99 % (11/30 0611) Last BM Date: 03/04/15 Filed Weights   03/05/15 0041  Weight: 97.7 kg (215 lb 6.2 oz)   General: looks better and well.  comfortable   Heart: RRR Chest: clear bil.  Abdomen: soft, active BS, NT  Extremities: no CCE Neuro/Psych:  Pleasant, alert, no confusion.   Intake/Output from previous day: 11/29 0701 - 11/30 0700 In: 2647.9 [P.O.:100; I.V.:2547.9] Out: 2650 [Urine:2650]  Intake/Output this shift: Total I/O In: 157.5 [I.V.:157.5] Out: -   Lab Results:  Recent Labs  03/04/15 1643 03/05/15 0701  WBC 8.7 8.2  HGB 15.5 13.0  HCT 45.1 38.3*  PLT 258 231   BMET  Recent Labs  03/04/15 1643 03/05/15 0701 03/06/15 0325  NA 134* 136 135  K 4.3 3.5 4.0  CL 101 104 102  CO2 22 26 27   GLUCOSE 106* 88 85  BUN 13 13 12   CREATININE 1.04 1.01 0.92  CALCIUM 9.5 9.0 9.0   LFT  Recent Labs  03/04/15 1643 03/05/15 0701 03/06/15 0325  PROT 7.7 6.3* 6.6  ALBUMIN 4.4 3.5 3.6  AST 18 13* 15  ALT 19 17 16*  ALKPHOS 104 79 81  BILITOT 1.4* 1.5* 1.6*   PT/INR No results for input(s): LABPROT, INR in the last 72 hours. Hepatitis Panel No results for input(s): HEPBSAG, HCVAB, HEPAIGM, HEPBIGM in the last 72 hours.  Studies/Results: Ct Abdomen Pelvis W Contrast  03/05/2015  CLINICAL DATA:  Nausea and vomiting since February 28, 2015. EXAM: CT ABDOMEN AND PELVIS WITH CONTRAST TECHNIQUE: Multidetector CT imaging of the abdomen and pelvis was performed using the standard  protocol following bolus administration of intravenous contrast. CONTRAST:  186mL OMNIPAQUE IOHEXOL 300 MG/ML  SOLN COMPARISON:  February 01, 2015 FINDINGS: There is a 2.5 mm low density in posterior right lobe inferior liver unchanged. The liver is otherwise normal. The spleen, pancreas, gallbladder, adrenal glands and kidneys are normal. There is no hydronephrosis bilaterally. The aorta is normal. There is no abdominal lymphadenopathy. There is evidence of prior left colon surgery stable compared to prior exam. The appendix is normal. There is no small bowel obstruction. Fluid-filled bladder is normal. There is evidence of prior right inguinal hernia repair unchanged. There is left inguinal herniation of mesenteric fat unchanged. The lung bases are clear. Mild degenerative joint changes of the lower lumbar spine are noted. IMPRESSION: No acute abnormality identified in the abdomen and pelvis. Evidence of prior left colon surgery stable compared to prior exam. There is no small bowel obstruction. The appendix is normal. Electronically Signed   By: Abelardo Diesel M.D.   On: 03/05/2015 15:02   Dg Abd Portable 1v  03/05/2015  CLINICAL DATA:  Mid abdominal pain with nausea and vomiting. EXAM: PORTABLE ABDOMEN - 1 VIEW COMPARISON:  Abdominal series on 03/02/2015 FINDINGS: No evidence of bowel obstruction. No abnormal calcifications. Bony structures are unremarkable. IMPRESSION: Negative. Electronically  Signed   By: Aletta Edouard M.D.   On: 03/05/2015 07:57   Scheduled Meds: . Barium Sulfate  450 mL Oral BID  . enoxaparin (LOVENOX) injection  40 mg Subcutaneous Q24H  . feeding supplement  1 Container Oral TID BM  . gi cocktail  30 mL Oral TID  . Linaclotide  145 mcg Oral Daily  . metoCLOPramide (REGLAN) injection  10 mg Intravenous 4 times per day  . pantoprazole (PROTONIX) IV  40 mg Intravenous Q12H  . polyethylene glycol  17 g Oral BID   Continuous Infusions: . 0.9 % NaCl with KCl 40 mEq / L Stopped  (03/06/15 0908)   PRN Meds:.acetaminophen **OR** [DISCONTINUED] acetaminophen, hydrALAZINE, ketorolac, [DISCONTINUED] ondansetron **OR** ondansetron (ZOFRAN) IV, promethazine   ASSESMENT:   *  Chronic, recurrent N/V abdominal pain in pt with hx complicated diverticulitis and sigmoid colectomy 10/2014.  Repeated CT scan 11/29 shows resolution of the sigmoid inflammation/diverticulitis noted on 2 CT scans in 01/2015.  No evidence of bowel obstruction or GOO on any of his many recent imaging studies.  Not convinced constipation is culprit, not that prominent on multiple recent imaging studies but was started on Linzess at recent [place] stay ending 11/28.     PLAN   *  Has GI office visit with his primary GI MD, Arther Dames, in [facility] set for 12/1.   Consider repeating colonoscopy/EGD, gastric emptying study but will defer to Dr Rayann Heman.   *  Continue once daily PPI (new), scheduled Reglan (new). Linzess, and Miralax continuing for now.  *  Switched to oral qid Reglan.  Warned pt re extrapyramidal sxs and to stop med if these arose.  Would take scheduled doses for now and in a few days switch to prn if nausea still resolved.   *  Switch to soft diet, if toleratated: can discharge home this PM.     *  Will sign off.    Azucena Freed  03/06/2015, 9:29 AM Pager: [phone]

## 2015-03-06 NOTE — Progress Notes (Signed)
AVS given to patient, understanding verbalized.  IV removed. Belongings packed. Transportation arranged by patient with wife. No wheelchair needed.

## 2015-03-07 ENCOUNTER — Other Ambulatory Visit: Payer: Self-pay | Admitting: Gastroenterology

## 2015-03-07 DIAGNOSIS — R112 Nausea with vomiting, unspecified: Secondary | ICD-10-CM

## 2015-03-07 DIAGNOSIS — R6881 Early satiety: Secondary | ICD-10-CM

## 2015-03-18 ENCOUNTER — Ambulatory Visit
Admission: RE | Admit: 2015-03-18 | Discharge: 2015-03-18 | Disposition: A | Discharge status: Home / Self Care | Payer: Managed Care, Other (non HMO) | Source: Ambulatory Visit | Attending: Gastroenterology | Admitting: Gastroenterology

## 2015-03-18 DIAGNOSIS — R6881 Early satiety: Secondary | ICD-10-CM | POA: Diagnosis present

## 2015-03-18 DIAGNOSIS — R112 Nausea with vomiting, unspecified: Secondary | ICD-10-CM | POA: Diagnosis not present

## 2015-03-18 MED ORDER — TECHNETIUM TC 99M SULFUR COLLOID
2.0000 | Freq: Once | INTRAVENOUS | Status: AC | PRN
  Administered 2015-03-18: 1.99 via INTRAVENOUS

## 2015-05-13 ENCOUNTER — Emergency Department (HOSPITAL_COMMUNITY)
Admission: EM | Admit: 2015-05-13 | Discharge: 2015-05-13 | Disposition: A | Discharge status: Home / Self Care | Payer: Worker's Compensation | Attending: Emergency Medicine | Admitting: Emergency Medicine

## 2015-05-13 ENCOUNTER — Encounter (HOSPITAL_COMMUNITY): Payer: Self-pay | Admitting: Cardiology

## 2015-05-13 DIAGNOSIS — Y9389 Activity, other specified: Secondary | ICD-10-CM | POA: Insufficient documentation

## 2015-05-13 DIAGNOSIS — S80812A Abrasion, left lower leg, initial encounter: Secondary | ICD-10-CM | POA: Insufficient documentation

## 2015-05-13 DIAGNOSIS — Z79899 Other long term (current) drug therapy: Secondary | ICD-10-CM | POA: Diagnosis not present

## 2015-05-13 DIAGNOSIS — Z973 Presence of spectacles and contact lenses: Secondary | ICD-10-CM | POA: Diagnosis not present

## 2015-05-13 DIAGNOSIS — Z87891 Personal history of nicotine dependence: Secondary | ICD-10-CM | POA: Insufficient documentation

## 2015-05-13 DIAGNOSIS — S199XXA Unspecified injury of neck, initial encounter: Secondary | ICD-10-CM | POA: Diagnosis not present

## 2015-05-13 DIAGNOSIS — Z87442 Personal history of urinary calculi: Secondary | ICD-10-CM | POA: Diagnosis not present

## 2015-05-13 DIAGNOSIS — I10 Essential (primary) hypertension: Secondary | ICD-10-CM | POA: Insufficient documentation

## 2015-05-13 DIAGNOSIS — K589 Irritable bowel syndrome without diarrhea: Secondary | ICD-10-CM | POA: Insufficient documentation

## 2015-05-13 DIAGNOSIS — Y92410 Unspecified street and highway as the place of occurrence of the external cause: Secondary | ICD-10-CM | POA: Insufficient documentation

## 2015-05-13 DIAGNOSIS — Z8739 Personal history of other diseases of the musculoskeletal system and connective tissue: Secondary | ICD-10-CM | POA: Diagnosis not present

## 2015-05-13 DIAGNOSIS — Y998 Other external cause status: Secondary | ICD-10-CM | POA: Insufficient documentation

## 2015-05-13 DIAGNOSIS — S60416A Abrasion of right little finger, initial encounter: Secondary | ICD-10-CM | POA: Diagnosis not present

## 2015-05-13 DIAGNOSIS — S6991XA Unspecified injury of right wrist, hand and finger(s), initial encounter: Secondary | ICD-10-CM | POA: Diagnosis present

## 2015-05-13 DIAGNOSIS — S80211A Abrasion, right knee, initial encounter: Secondary | ICD-10-CM | POA: Insufficient documentation

## 2015-05-13 MED ORDER — DOUBLE ANTIBIOTIC 500-10000 UNIT/GM EX OINT
TOPICAL_OINTMENT | CUTANEOUS | Status: AC
  Filled 2015-05-13: qty 1
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
## 2015-12-29 ENCOUNTER — Emergency Department
Admission: EM | Admit: 2015-12-29 | Discharge: 2015-12-29 | Disposition: A | Discharge status: Home / Self Care | Payer: Managed Care, Other (non HMO) | Attending: Family Medicine | Admitting: Family Medicine

## 2015-12-29 ENCOUNTER — Encounter: Payer: Self-pay | Admitting: Emergency Medicine

## 2015-12-29 DIAGNOSIS — Z87891 Personal history of nicotine dependence: Secondary | ICD-10-CM | POA: Insufficient documentation

## 2015-12-29 DIAGNOSIS — M5410 Radiculopathy, site unspecified: Secondary | ICD-10-CM | POA: Diagnosis not present

## 2015-12-29 DIAGNOSIS — Z5321 Procedure and treatment not carried out due to patient leaving prior to being seen by health care provider: Secondary | ICD-10-CM | POA: Diagnosis not present

## 2015-12-29 DIAGNOSIS — Z79899 Other long term (current) drug therapy: Secondary | ICD-10-CM | POA: Insufficient documentation

## 2015-12-29 DIAGNOSIS — R531 Weakness: Secondary | ICD-10-CM | POA: Diagnosis present

## 2015-12-29 DIAGNOSIS — I10 Essential (primary) hypertension: Secondary | ICD-10-CM | POA: Insufficient documentation

## 2015-12-29 MED ORDER — MELOXICAM 15 MG PO TABS: 15.0000 mg | ORAL_TABLET | Freq: Every day | ORAL | 0 refills | Status: DC

## 2015-12-29 MED ORDER — PREDNISONE 10 MG (21) PO TBPK: ORAL_TABLET | ORAL | 0 refills | Status: DC

## 2015-12-29 NOTE — ED Triage Notes (Signed)
Left arm numbness times 1 week.

## 2015-12-29 NOTE — ED Triage Notes (Signed)
Pt arrived to ED with c/o of bilateral arm pain with the left arm hurting more. Pt states this has been going on for several mos. His PCP initially thought it was an allergic reaction to his BP medication. Pt was placed on a diff med and the pain subsided but has returned. 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

## 2016-01-15 NOTE — ED Provider Notes (Signed)
[facility] Emergency Department Provider Note ____________________________________________  Time seen: Approximately 8:24 PM  I have reviewed the triage vital signs and the nursing notes.   HISTORY  Chief Complaint Extremity Weakness    HPI Edward Harris is a 40 y.o. male who presents to the emergency department for evaluation of bilateral arm pain. He states his left arm hurts worse than his right. He states that this been going on for several months and he was evaluated for the same thing by his primary care provider. His primary care provider thought maybe it was an adverse reaction to his blood pressure medications so he switched him to something different. The pain subsided for a couple of days, but has since returned. 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 He states that the pain in the left arm radiates down into his fingers and describes it as a tingling feeling. He has not taken any additional medications other than his prescription medications.  Past Medical History:  Diagnosis Date  . Arthritis    ankles  . Diverticulitis   . Hypertension   . IBS (irritable bowel syndrome)   . Nephrolithiasis   . Wears contact lenses     Patient Active Problem List   Diagnosis Date Noted  . Intractable vomiting 03/05/2015  . Hypertension, uncontrolled 03/05/2015  . Nausea & vomiting 03/02/2015  . Chronic constipation   . Refractory nausea and vomiting   . Emesis   . Esophageal bleeding 01/19/2015  . Generalized abdominal pain   . Intractable nausea and vomiting 12/10/2014  . Nausea   . Abdominal pain 11/14/2014  . Colitis, regional ([place]) 10/31/2014  . Benign neoplasm of sigmoid colon   . Diverticulitis of large intestine without perforation or abscess without bleeding   . Nausea with vomiting   . Esophageal hemorrhage   . Gastric catarrh   . Idiopathic colitis   . Diverticulitis of colon 09/26/2014  .  Non-intractable cyclical vomiting with nausea 09/26/2014  . Diverticulitis large intestine w/o perforation or abscess w/o bleeding 09/07/2014  . Diverticulitis 08/19/2014  . Ileus ([place]) 08/19/2014  . Hypertension 08/19/2014  . Intestinal obstruction due to decreased peristalsis 08/19/2014  . 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 Renal colic 0000000  . Hydronephrosis 12/08/2011  . Calculi, ureter 12/08/2011    Past Surgical History:  Procedure Laterality Date  . COLON RESECTION  10/30/2014  . COLONOSCOPY WITH PROPOFOL N/A 10/03/2014   Procedure: COLONOSCOPY WITH PROPOFOL;  Surgeon: Lucilla Lame, MD;  Location: [place];  Service: Endoscopy;  Laterality: N/A;  . ESOPHAGOGASTRODUODENOSCOPY (EGD) WITH PROPOFOL N/A 10/03/2014   Procedure: ESOPHAGOGASTRODUODENOSCOPY (EGD) WITH PROPOFOL;  Surgeon: Lucilla Lame, MD;  Location: [place];  Service: Endoscopy;  Laterality: N/A;  . INGUINAL HERNIA REPAIR Right   . KIDNEY STONE SURGERY     extraction/lithotripsy  . LAPAROSCOPIC SIGMOID COLECTOMY N/A 10/19/2014   Procedure: LAPAROSCOPIC ASSISTED  SIGMOID COLECTOMY;  Surgeon: Marlyce Huge, MD;  Location: [facility];  Service: General;  Laterality: N/A;    Prior to Admission medications   Medication Sig Start Date End Date Taking? Authorizing Provider  Linaclotide (LINZESS) 145 MCG CAPS capsule Take 1 capsule (145 mcg total) by mouth daily. 03/04/15   Epifanio Lesches, MD  losartan-hydrochlorothiazide (HYZAAR) 100-25 MG tablet Take 1 tablet by mouth daily. 03/04/15   Epifanio Lesches, MD  meloxicam (MOBIC) 15 MG tablet Take 1 tablet (15 mg total) by mouth daily. 12/29/15   Daysen Gundrum B  Meredeth Furber, FNP  metoCLOPramide (REGLAN) 5 MG tablet Take 1 tablet (5 mg total) by mouth 3 (three) times daily before meals. 03/06/15   Thurnell Lose, MD  ondansetron (ZOFRAN) 4 MG tablet Take 1 tablet (4 mg total) by mouth every 8 (eight) hours as needed for nausea or vomiting. 03/06/15   Thurnell Lose, MD  pantoprazole (PROTONIX) 40 MG tablet Take 1 tablet (40 mg total) by mouth 2 (two) times daily. 03/06/15   Thurnell Lose, MD  polyethylene glycol (MIRALAX / GLYCOLAX) packet Take 17 g by mouth daily. 02/04/15   Marlyce Huge, MD  polyethylene glycol (MIRALAX / GLYCOLAX) packet Take 17 g by mouth daily. Patient not taking: Reported on 03/04/2015 03/04/15   Epifanio Lesches, MD  predniSONE (STERAPRED UNI-PAK 21 TAB) 10 MG (21) TBPK tablet Take 6 tablets on day 1 Take 5 tablets on day 2 Take 4 tablets on day 3 Take 3 tablets on day 4 Take 2 tablets on day 5 Take 1 tablet on day 6 12/29/15   Victorino Dike, FNP    Allergies Amlodipine  Family History  Problem Relation Age of Onset  . Hypertension Mother   . Osteoarthritis Mother   . Heart attack Father   . Hypertension Brother   . Alzheimer's disease Maternal Aunt   . Lung cancer    . Colon cancer Maternal Grandmother 75    Social History Social History  Substance Use Topics  . Smoking status: Former Research scientist (life sciences)  . Smokeless tobacco: Never Used  . Alcohol use 0.0 oz/week     Comment: "occassional" couple drinks per month    Review of Systems Constitutional: No recent illness. Cardiovascular: Denies chest pain or palpitations. Respiratory: Denies shortness of breath. Musculoskeletal: Pain in Bilateral arms, left greater than right Skin: Negative for rash, wound, lesion. Neurological: Negative for focal weakness or numbness.  ____________________________________________   PHYSICAL EXAM:  VITAL SIGNS: ED Triage Vitals  Enc Vitals Group     BP 12/29/15 1549 (!) 153/94     Pulse Rate 12/29/15 1549 75     Resp 12/29/15 1549 18     Temp 12/29/15 1549 98.6 F (37 C)     Temp Source 12/29/15 1549 Oral     SpO2 12/29/15 1549 98 %     Weight 12/29/15 1550 235 lb (106.6 kg)     Height 12/29/15 1550 5\' 10"  (1.778 m)     Head Circumference --      Peak Flow --      Pain Score 12/29/15 1551 9     Pain  Loc --      Pain Edu? --      Excl. in [place]? --     Constitutional: Alert and oriented. Well appearing and in no acute distress. Eyes: Conjunctivae are normal. EOMI. Head: Atraumatic. Neck: No stridor.  Respiratory: Normal respiratory effort.   Musculoskeletal: Full range of motion of bilateral upper extremities including shoulders, elbows, wrists, and fingers. Neurologic:  Normal speech and language. No gross focal neurologic deficits are appreciated. Speech is normal. No gait instability. Grip strength is equal bilaterally. Good motor and sensory function. Skin:  Skin is warm, dry and intact. Atraumatic. Psychiatric: Mood and affect are normal. Speech and behavior are normal.  ____________________________________________   LABS (all labs ordered are listed, but only abnormal results are displayed)  Labs Reviewed - No data to display ____________________________________________  RADIOLOGY  Not indicated ____________________________________________   PROCEDURES  Procedure(s) performed: None  ____________________________________________   INITIAL IMPRESSION / ASSESSMENT AND PLAN / ED COURSE  Clinical Course    Pertinent labs & imaging results that were available during my care of the patient were reviewed by me and considered in my medical decision making (see chart for details).  Patient to follow up with his primary care provider for symptoms that are not improving with prednisone. He is to start the meloxicam after finishing the prednisone. He is to return to the emergency department for symptoms that change or worsen if unable to schedule an appointment.  ____________________________________________   FINAL CLINICAL IMPRESSION(S) / ED DIAGNOSES  Final diagnoses:  Radiculopathy affecting upper extremity       Victorino Dike, FNP 01/15/16 2030    Lavonia Drafts, MD 01/17/16 1005

## 2016-06-22 ENCOUNTER — Encounter: Payer: Self-pay | Admitting: *Deleted

## 2016-06-26 ENCOUNTER — Encounter
Admission: RE | Disposition: A | Discharge status: Home / Self Care | Payer: Self-pay | Source: Ambulatory Visit | Attending: Unknown Physician Specialty

## 2016-06-26 ENCOUNTER — Ambulatory Visit
Admission: RE | Admit: 2016-06-26 | Discharge: 2016-06-26 | Disposition: A | Discharge status: Home / Self Care | Payer: Managed Care, Other (non HMO) | Source: Ambulatory Visit | Attending: Unknown Physician Specialty | Admitting: Unknown Physician Specialty

## 2016-06-26 ENCOUNTER — Ambulatory Visit: Payer: Managed Care, Other (non HMO) | Admitting: Anesthesiology

## 2016-06-26 DIAGNOSIS — K589 Irritable bowel syndrome without diarrhea: Secondary | ICD-10-CM | POA: Diagnosis not present

## 2016-06-26 DIAGNOSIS — G5603 Carpal tunnel syndrome, bilateral upper limbs: Secondary | ICD-10-CM | POA: Insufficient documentation

## 2016-06-26 DIAGNOSIS — I10 Essential (primary) hypertension: Secondary | ICD-10-CM | POA: Diagnosis not present

## 2016-06-26 DIAGNOSIS — Z87891 Personal history of nicotine dependence: Secondary | ICD-10-CM | POA: Diagnosis not present

## 2016-06-26 HISTORY — DX: Dizziness and giddiness: R42

## 2016-06-26 HISTORY — PX: CARPAL TUNNEL RELEASE: SHX101

## 2016-06-26 SURGERY — CARPAL TUNNEL RELEASE
Anesthesia: Monitor Anesthesia Care | Laterality: Left | Wound class: Clean

## 2016-06-26 MED ORDER — FENTANYL CITRATE (PF) 250 MCG/5ML IJ SOLN
INTRAMUSCULAR | Status: DC | PRN
  Administered 2016-06-26: 100 ug via INTRAVENOUS

## 2016-06-26 MED ORDER — DEXAMETHASONE SODIUM PHOSPHATE 4 MG/ML IJ SOLN
INTRAMUSCULAR | Status: DC | PRN
  Administered 2016-06-26: 4 mg via PERINEURAL

## 2016-06-26 MED ORDER — LIDOCAINE HCL (PF) 2 % IJ SOLN
INTRAMUSCULAR | Status: DC | PRN
  Administered 2016-06-26: 20 mL via PERINEURAL

## 2016-06-26 MED ORDER — ONDANSETRON HCL 4 MG/2ML IJ SOLN: 4.0000 mg | Freq: Once | INTRAMUSCULAR | Status: DC | PRN

## 2016-06-26 MED ORDER — NORCO 5-325 MG PO TABS: 1.0000 | ORAL_TABLET | Freq: Four times a day (QID) | ORAL | 0 refills | Status: AC | PRN

## 2016-06-26 MED ORDER — OXYCODONE HCL 5 MG PO TABS: 5.0000 mg | ORAL_TABLET | Freq: Once | ORAL | Status: DC | PRN

## 2016-06-26 MED ORDER — ACETAMINOPHEN 325 MG PO TABS: 325.0000 mg | ORAL_TABLET | ORAL | Status: DC | PRN

## 2016-06-26 MED ORDER — PROPOFOL 500 MG/50ML IV EMUL
INTRAVENOUS | Status: DC | PRN
  Administered 2016-06-26: 100 ug/kg/min via INTRAVENOUS

## 2016-06-26 MED ORDER — MIDAZOLAM HCL 2 MG/2ML IJ SOLN
INTRAMUSCULAR | Status: DC | PRN
  Administered 2016-06-26: 2 mg via INTRAVENOUS

## 2016-06-26 MED ORDER — FENTANYL CITRATE (PF) 100 MCG/2ML IJ SOLN: 25.0000 ug | INTRAMUSCULAR | Status: DC | PRN

## 2016-06-26 MED ORDER — LACTATED RINGERS IV SOLN: 500.0000 mL | INTRAVENOUS | Status: DC

## 2016-06-26 MED ORDER — LACTATED RINGERS IV SOLN
INTRAVENOUS | Status: DC
  Administered 2016-06-26: 07:00:00 via INTRAVENOUS

## 2016-06-26 MED ORDER — LIDOCAINE HCL (CARDIAC) 20 MG/ML IV SOLN
INTRAVENOUS | Status: DC | PRN
  Administered 2016-06-26: 50 mg via INTRAVENOUS

## 2016-06-26 MED ORDER — OXYCODONE HCL 5 MG/5ML PO SOLN: 5.0000 mg | Freq: Once | ORAL | Status: DC | PRN

## 2016-06-26 MED ORDER — ROPIVACAINE HCL 5 MG/ML IJ SOLN
INTRAMUSCULAR | Status: DC | PRN
  Administered 2016-06-26: 20 mL via PERINEURAL

## 2016-06-26 MED ORDER — ACETAMINOPHEN 160 MG/5ML PO SOLN: 325.0000 mg | ORAL | Status: DC | PRN

## 2016-06-26 SURGICAL SUPPLY — 28 items
BANDAGE ELASTIC 2 LF NS (GAUZE/BANDAGES/DRESSINGS) ×3 IMPLANT
BNDG ESMARK 4X12 TAN STRL LF (GAUZE/BANDAGES/DRESSINGS) ×3 IMPLANT
BNDG STRETCH 4X75 STRL LF (GAUZE/BANDAGES/DRESSINGS) ×3 IMPLANT
COVER LIGHT HANDLE FLEXIBLE (MISCELLANEOUS) ×3 IMPLANT
CUFF TOURN SGL QUICK 18 (TOURNIQUET CUFF) ×3 IMPLANT
DURAPREP 26ML APPLICATOR (WOUND CARE) ×3 IMPLANT
GAUZE SPONGE 4X4 12PLY STRL (GAUZE/BANDAGES/DRESSINGS) ×3 IMPLANT
GLOVE BIO SURGEON STRL SZ7.5 (GLOVE) ×3 IMPLANT
GLOVE BIO SURGEON STRL SZ8 (GLOVE) ×6 IMPLANT
GLOVE INDICATOR 8.0 STRL GRN (GLOVE) ×3 IMPLANT
GOWN STRL REUS W/ TWL LRG LVL3 (GOWN DISPOSABLE) ×2 IMPLANT
GOWN STRL REUS W/TWL LRG LVL3 (GOWN DISPOSABLE) ×4
KIT ROOM TURNOVER OR (KITS) ×3 IMPLANT
LOOP VESSEL RED MINI 1.3X0.9 (MISCELLANEOUS) IMPLANT
LOOPS RED MINI 1.3MMX0.9MM (MISCELLANEOUS)
NS IRRIG 500ML POUR BTL (IV SOLUTION) ×3 IMPLANT
PACK EXTREMITY ARMC (MISCELLANEOUS) ×3 IMPLANT
PAD GROUND ADULT SPLIT (MISCELLANEOUS) ×3 IMPLANT
PADDING CAST 2X4YD ST (MISCELLANEOUS) ×2
PADDING CAST BLEND 2X4 STRL (MISCELLANEOUS) ×1 IMPLANT
SOL PREP PVP 2OZ (MISCELLANEOUS) ×3
SOLUTION PREP PVP 2OZ (MISCELLANEOUS) ×1 IMPLANT
SPLINT CAST 1 STEP 3X12 (MISCELLANEOUS) ×3 IMPLANT
STOCKINETTE 4X48 STRL (DRAPES) ×3 IMPLANT
STRAP BODY AND KNEE 60X3 (MISCELLANEOUS) ×3 IMPLANT
SUT ETHILON 4-0 (SUTURE) ×2
SUT ETHILON 4-0 FS2 18XMFL BLK (SUTURE) ×1
SUTURE ETHLN 4-0 FS2 18XMF BLK (SUTURE) ×1 IMPLANT

## 2016-06-26 NOTE — Progress Notes (Signed)
Assisted Edward Harris ANMD with left, ultrasound guided, supraclavicular block. Side rails up, monitors on throughout procedure. See vital signs in flow sheet. Tolerated Procedure well.

## 2016-06-26 NOTE — Anesthesia Procedure Notes (Signed)
Anesthesia Regional Block: Supraclavicular block   Pre-Anesthetic Checklist: ,, timeout performed, Correct Patient, Correct Site, Correct Laterality, Correct Procedure, Correct Position, site marked, Risks and benefits discussed,  Surgical consent,  Pre-op evaluation,  At surgeon's request and post-op pain management  Laterality: Left  Prep: chloraprep       Needles:  Injection technique: Single-shot  Needle Type: Echogenic Stimulator Needle     Needle Length: 4cm  Needle Gauge: 21     Additional Needles:   Procedures: ultrasound guided, nerve stimulator,,,,,,  Narrative:  Injection made incrementally with aspirations every 5 mL.  Performed by: Personally  Anesthesiologist: Marchia Bond D  Additional Notes: Functioning IV was confirmed and monitors applied.  Sterile prep and drape,hand hygiene and sterile gloves were used.Ultrasound guidance: relevant anatomy identified, needle position confirmed, local anesthetic spread visualized around nerve(s)., vascular puncture avoided.  Image printed for medical record.  Negative aspiration and negative test dose prior to incremental administration of local anesthetic. The patient tolerated the procedure well. Vitals signes recorded in RN notes.

## 2016-06-26 NOTE — Anesthesia Postprocedure Evaluation (Signed)
Anesthesia Post Note  Patient: Edward Harris  Procedure(s) Performed: Procedure(s) (LRB): left open carpal tunnel release (Left)  Patient location during evaluation: PACU Anesthesia Type: Regional Level of consciousness: awake and alert Pain management: pain level controlled Vital Signs Assessment: post-procedure vital signs reviewed and stable Respiratory status: spontaneous breathing, nonlabored ventilation and respiratory function stable Cardiovascular status: stable and blood pressure returned to baseline Anesthetic complications: no    DANIEL D KOVACS

## 2016-06-26 NOTE — Discharge Instructions (Signed)
General Anesthesia, Adult, Care After These instructions provide you with information about caring for yourself after your procedure. Your health care provider may also give you more specific instructions. Your treatment has been planned according to current medical practices, but problems sometimes occur. Call your health care provider if you have any problems or questions after your procedure. What can I expect after the procedure? After the procedure, it is common to have:  Vomiting.  A sore throat.  Mental slowness. It is common to feel:  Nauseous.  Cold or shivery.  Sleepy.  Tired.  Sore or achy, even in parts of your body where you did not have surgery. Follow these instructions at home: For at least 24 hours after the procedure:   Do not:  Participate in activities where you could fall or become injured.  Drive.  Use heavy machinery.  Drink alcohol.  Take sleeping pills or medicines that cause drowsiness.  Make important decisions or sign legal documents.  Take care of children on your own.  Rest. Eating and drinking   If you vomit, drink water, juice, or soup when you can drink without vomiting.  Drink enough fluid to keep your urine clear or pale yellow.  Make sure you have little or no nausea before eating solid foods.  Follow the diet recommended by your health care provider. General instructions   Have a responsible adult stay with you until you are awake and alert.  Return to your normal activities as told by your health care provider. Ask your health care provider what activities are safe for you.  Take over-the-counter and prescription medicines only as told by your health care provider.  If you smoke, do not smoke without supervision.  Keep all follow-up visits as told by your health care provider. This is important. Contact a health care provider if:  You continue to have nausea or vomiting at home, and medicines are not helpful.  You  cannot drink fluids or start eating again.  You cannot urinate after 8-12 hours.  You develop a skin rash.  You have fever.  You have increasing redness at the site of your procedure. Get help right away if:  You have difficulty breathing.  You have chest pain.  You have unexpected bleeding.  You feel that you are having a life-threatening or urgent problem. This information is not intended to replace advice given to you by your health care provider. Make sure you discuss any questions you have with your health care provider. Document Released: 06/29/2000 Document Revised: 08/26/2015 Document Reviewed: 03/07/2015 Elsevier Interactive Patient Education  2017 [employer]  Elevation  Ice pack  RTC in about 2 weeks

## 2016-06-26 NOTE — Op Note (Addendum)
DATE OF SURGERY:  06/26/2016  PATIENT NAME:  Edward Harris   DOB: Dec 27, 1975  MRN: [id number]  PRE-OPERATIVE DIAGNOSIS: Left carpal tunnel syndrome  POST-OPERATIVE DIAGNOSIS:  Same  PROCEDURE: Left carpal tunnel release  SURGEON: Dr. Leanor Kail, Brooke Bonito. M.D.  ANESTHESIA: Supraclavicular block   INDICATIONS FOR SURGERY: [name] is a 41 y.o. year old male with a long history of numbness and paresthesias in the left hand. Nerve conduction studies demonstrated findings consistent with significant  median nerve compression.The patient had not seen any significant improvement despite conservative nonsurgical intervention. After discussion of the risks and benefits of surgical intervention, the patient expressed understanding of the risks benefits and agreed with plans for carpal tunnel release.   PROCEDURE IN DETAIL: The patient was taken the operating room where satisfactory general anesthesia was achieved. A tourniquet was placed on the patient's left upper arm.The left hand and arm were prepped  and draped in the usual sterile fashion. A "time-out" was performed as per usual protocol. The hand and forearm were exsanguinated using an Esmarch and the tourniquet was inflated to 250 mmHg.  An incision was made just ulnar to the thenar palmar crease. Dissection was carried down through the palmar fascia to the transverse carpal ligament. The transverse carpal ligament was sharply incised, taking care to protect the underlying structures within the carpal tunnel. Complete release of the transverse carpal ligament was achieved. There was no evidence of a mass or proliferative synovitis within the carpal tunnel. The median nerve did appear to be slightly flattened. The wound was irrigated with saline. The tourniquet was released at this time. It had been up for about 7 minutes. Bleeding was controlled with digital pressure and coagulation cautery. The skin was then re-approximated with interrupted  sutures of #4-0 nylon. A sterile dressing was applied followed by application of a volar splint.  The patient was awakened and transferred to a stretcher bed.  The patient tolerated the procedure well and was transported to the PACU in stable condition. Blood loss was negligible.  Dr. Mariel Kansky. M.D.

## 2016-06-26 NOTE — H&P (Signed)
  H and P reviewed. No changes. Uploaded at later date. 

## 2016-06-26 NOTE — Addendum Note (Signed)
Addendum  created 06/26/16 0941 by Esmeralda Links, MD   Anesthesia Intra Blocks edited, Child order released for a procedure order, Sign clinical note

## 2016-06-26 NOTE — Anesthesia Preprocedure Evaluation (Signed)
Anesthesia Evaluation  Patient identified by MRN, date of birth, ID band Patient awake    Reviewed: Allergy & Precautions, H&P , NPO status , Patient's Chart, lab work & pertinent test results, reviewed documented beta blocker date and time   Airway Mallampati: III  TM Distance: >3 FB Neck ROM: full    Dental no notable dental hx.    Pulmonary former smoker,    Pulmonary exam normal breath sounds clear to auscultation       Cardiovascular Exercise Tolerance: Good hypertension, negative cardio ROS   Rhythm:regular Rate:Normal     Neuro/Psych negative neurological ROS  negative psych ROS   GI/Hepatic negative GI ROS, Neg liver ROS,   Endo/Other  negative endocrine ROS  Renal/GU negative Renal ROS  negative genitourinary   Musculoskeletal   Abdominal   Peds  Hematology negative hematology ROS (+)   Anesthesia Other Findings   Reproductive/Obstetrics negative OB ROS                             Anesthesia Physical Anesthesia Plan  ASA: II  Anesthesia Plan: MAC and Regional   Post-op Pain Management:    Induction:   Airway Management Planned:   Additional Equipment:   Intra-op Plan:   Post-operative Plan:   Informed Consent: I have reviewed the patients History and Physical, chart, labs and discussed the procedure including the risks, benefits and alternatives for the proposed anesthesia with the patient or authorized representative who has indicated his/her understanding and acceptance.     Plan Discussed with: CRNA  Anesthesia Plan Comments:         Anesthesia Quick Evaluation

## 2016-06-26 NOTE — Transfer of Care (Signed)
Immediate Anesthesia Transfer of Care Note  Patient: Edward Harris  Procedure(s) Performed: Procedure(s): left open carpal tunnel release (Left)  Patient Location: PACU  Anesthesia Type: MAC, Regional  Level of Consciousness: awake, alert  and patient cooperative  Airway and Oxygen Therapy: Patient Spontanous Breathing and Patient connected to supplemental oxygen  Post-op Assessment: Post-op Vital signs reviewed, Patient's Cardiovascular Status Stable, Respiratory Function Stable, Patent Airway and No signs of Nausea or vomiting  Post-op Vital Signs: Reviewed and stable  Complications: No apparent anesthesia complications

## 2016-06-26 NOTE — Anesthesia Procedure Notes (Signed)
Procedure Name: MAC Performed by: Valerya Maxton Pre-anesthesia Checklist: Patient identified, Emergency Drugs available, Suction available, Timeout performed and Patient being monitored Patient Re-evaluated:Patient Re-evaluated prior to inductionOxygen Delivery Method: Nasal cannula Placement Confirmation: positive ETCO2     

## 2016-06-29 ENCOUNTER — Encounter: Payer: Self-pay | Admitting: Unknown Physician Specialty

## 2017-02-23 IMAGING — CT CT ABD-PELV W/ CM
1 of 3 series · 12 of 32 positions shown, 17 images · IV contrast (omnipaque)
Comparison: October 30, 2014 and August 15, 2013

CLINICAL DATA: Abdominal pain with vomiting

EXAM:
CT ABDOMEN AND PELVIS WITH CONTRAST
TECHNIQUE: Multidetector CT imaging of the abdomen and pelvis was performed
using the standard protocol following bolus administration of
intravenous contrast. Oral contrast was also administered.
CONTRAST:  100mL OMNIPAQUE IOHEXOL 300 MG/ML  SOLN

[Series 2: routine abd pel with · axial · 0.81mm/px · z∈[-466,-46]mm · 12 of 98 slices shown, 17 images]
[im 7/98  soft-tissue]
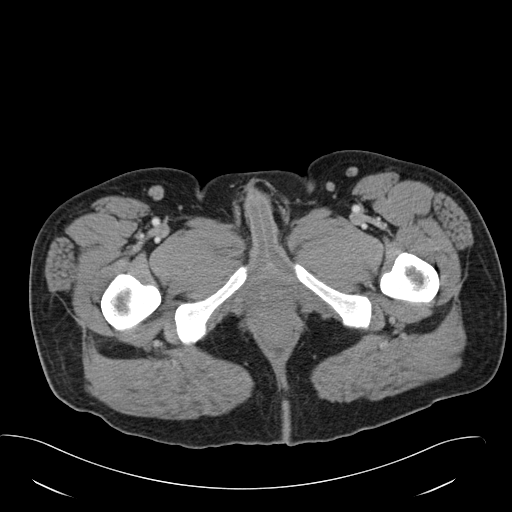
[im 7/98  bone]
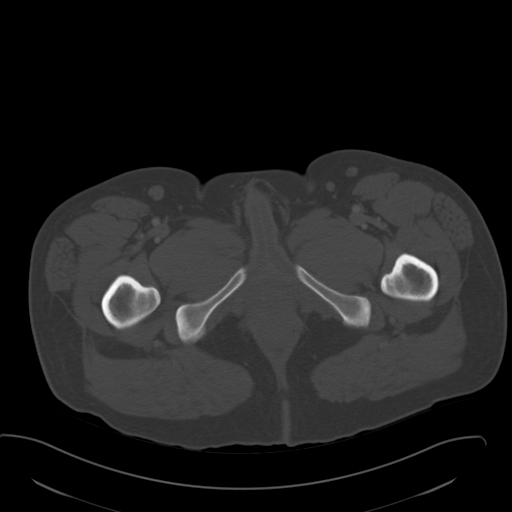
[im 14/98  soft-tissue]
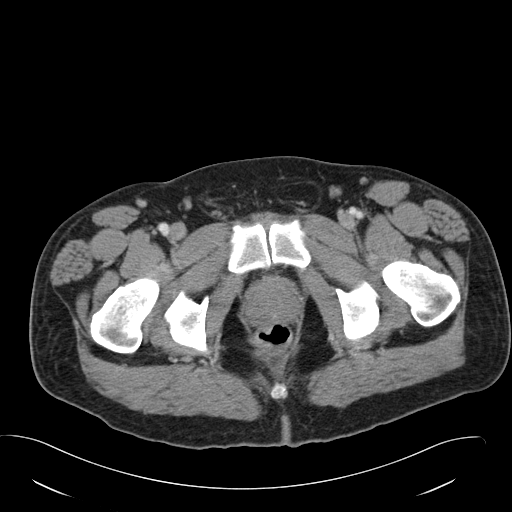
[im 21/98  soft-tissue]
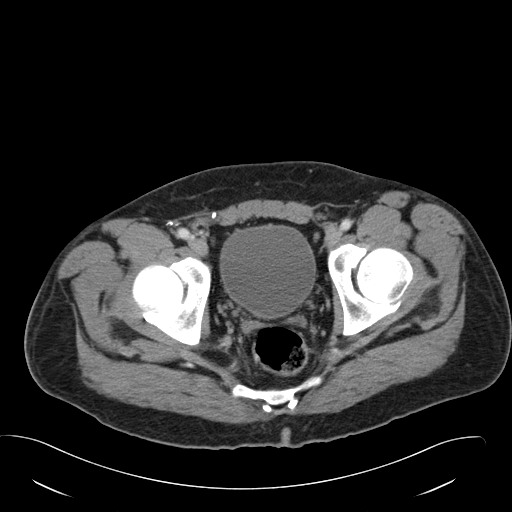
[im 35/98  soft-tissue]
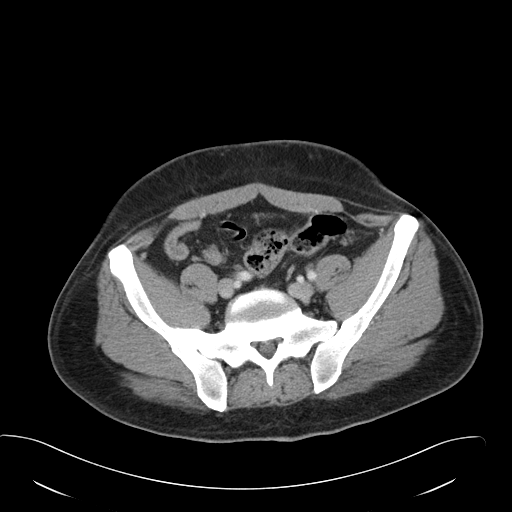
[im 42/98  soft-tissue]
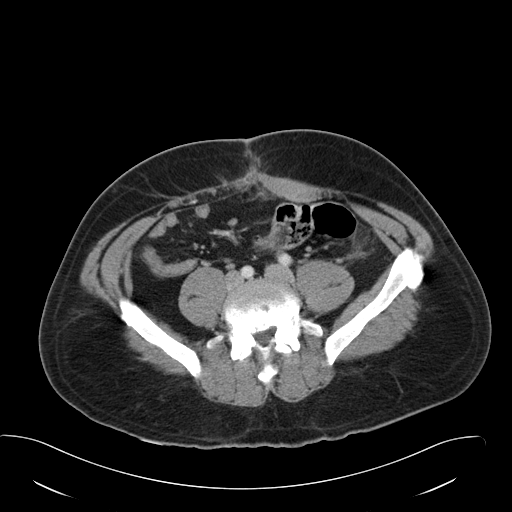
[im 49/98  soft-tissue]
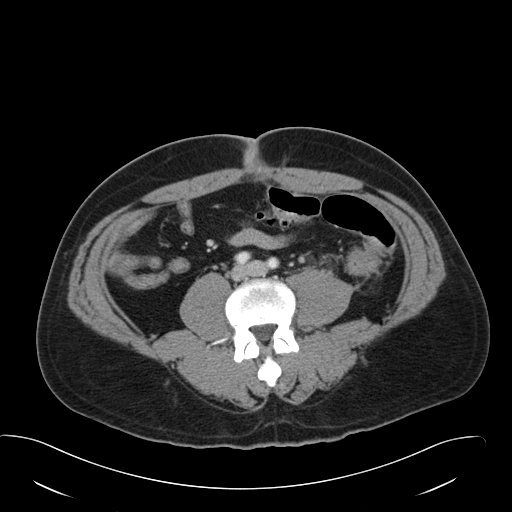
[im 56/98  soft-tissue]
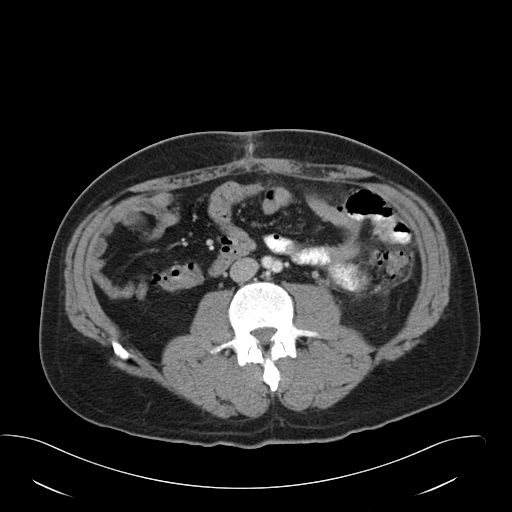
[im 63/98  soft-tissue]
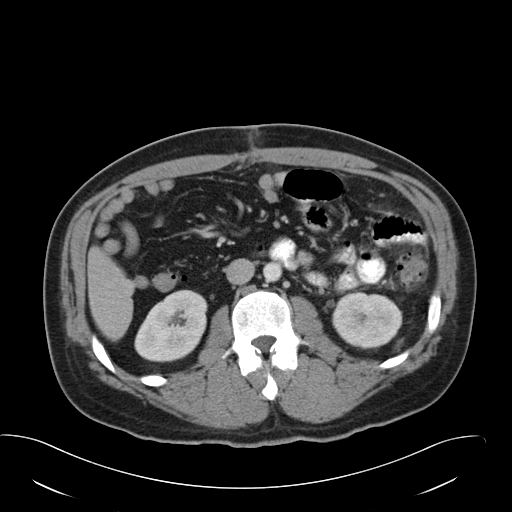
[im 70/98  lung]
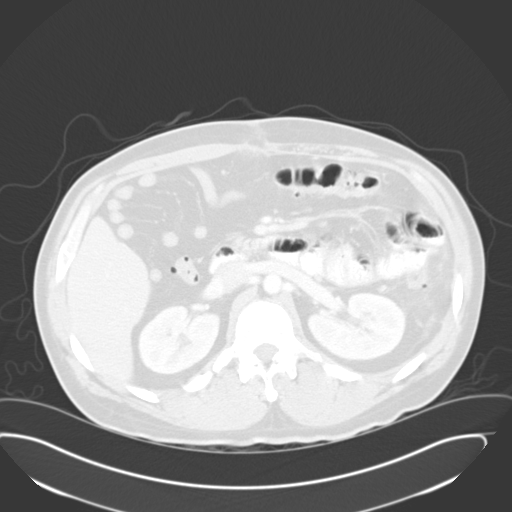
[im 77/98  soft-tissue]
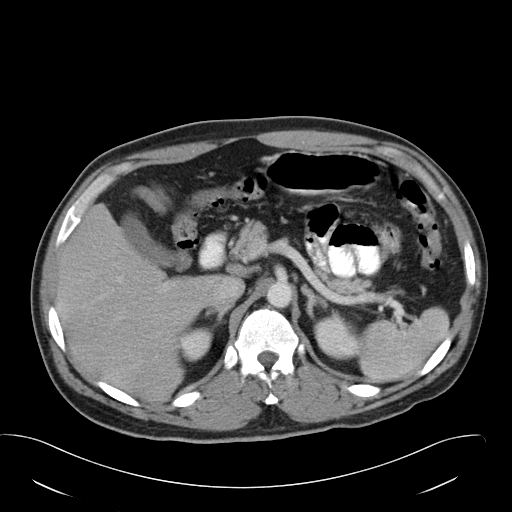
[im 77/98  lung]
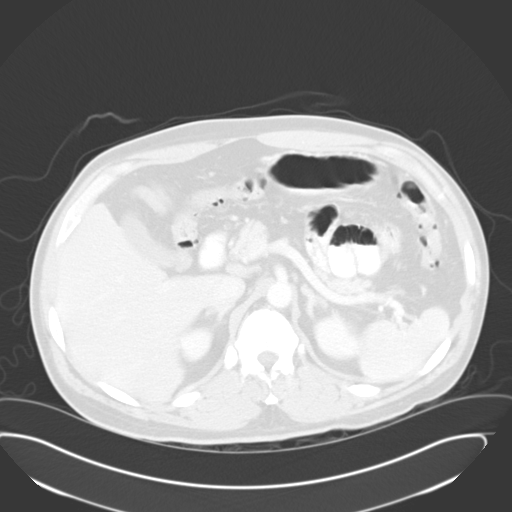
[im 77/98  bone]
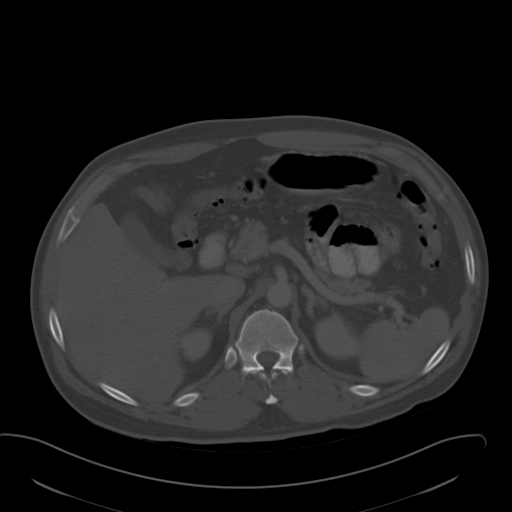
[im 84/98  soft-tissue]
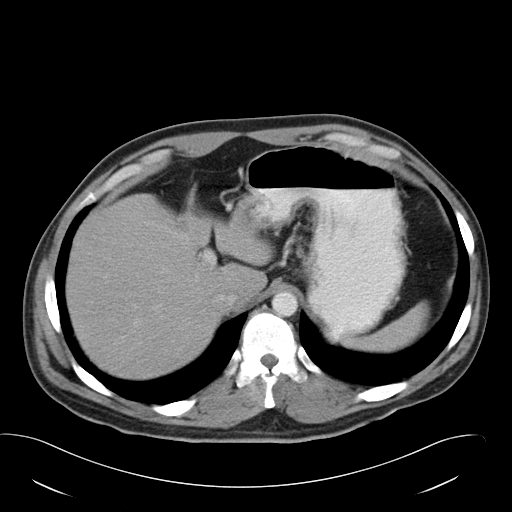
[im 84/98  lung]
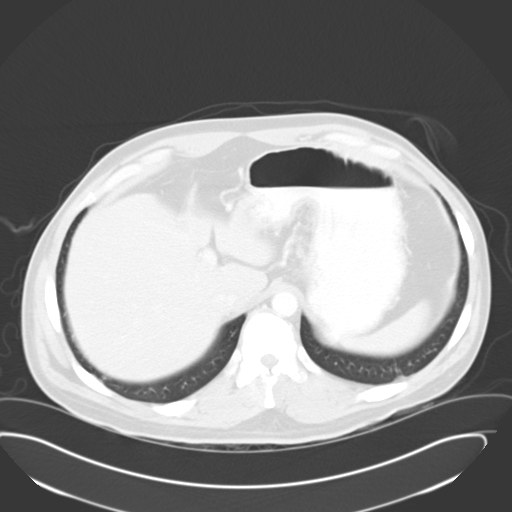
[im 91/98  soft-tissue]
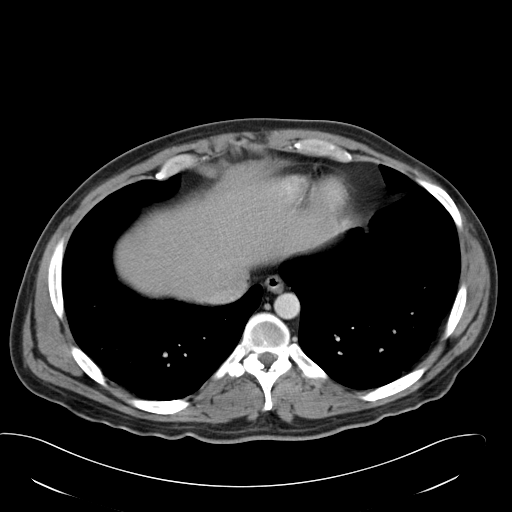
[im 91/98  lung]
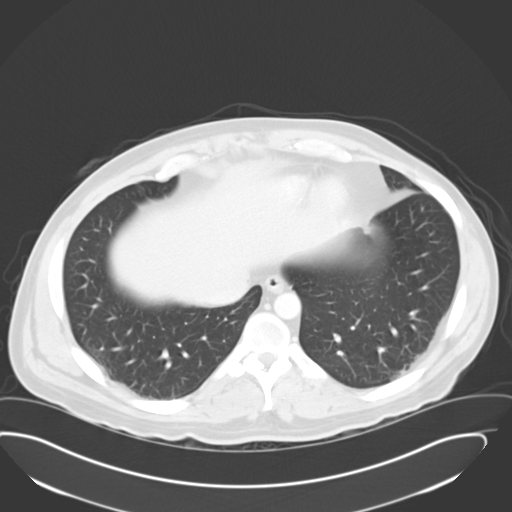

[12 of 32 positions shown; findings below may reference images not displayed]

FINDINGS: There is slight bibasilar atelectatic change. There is an apparent
scar in the anterior left base region, stable. Lung bases otherwise
are clear.

There is a tiny focus of decreased attenuation in the inferior
posterior segment of the right lobe of the liver consistent with
either a tiny cyst or hamartoma. No other focal liver lesions are
identified. Gallbladder wall is not appreciably thickened. There is
no biliary duct dilatation.

Spleen, pancreas, and adrenals appear normal. Kidneys bilaterally
show no mass or hydronephrosis. There is no renal or ureteral
calculus on either side.

In the pelvis, the urinary bladder is midline with normal wall
thickness. There is fat in both inguinal rings. Patient has had a
previous hernia repair on the right. On the left, a hernia defect is
seen in the inguinal region with only fat extending into this area,
stable. There is no pelvic mass or pelvic fluid collection. The
appendix appears normal.

The patient has had a colonic anastomosis at the descending
colon-sigmoid junction. There remains moderate wall thickening and
mesenteric inflammation in this area. There are multiple diverticula
in this area focally. Mesenteric inflammation extends superiorly to
the level of the proximal descending colon, similar to prior study.
This area appears quite similar to recent CT. There is no frank
abscess or microperforation in this area. There has been no
appreciable extension of inflammation in this area compared to
recent prior study. In addition, there is mesenteric stranding in
the periumbilical region consistent with recent surgery in this
area. No abscess or fluid collection is seen in this area.

Appendix appears normal.

There are scattered diverticula throughout the colon without other
areas suggesting diverticulitis.

No bowel obstruction.  No free air or portal venous air.

There is no ascites, adenopathy, or abscess in the abdomen or
pelvis. There is no abdominal aortic aneurysm. There are no blastic
or lytic bone lesions.
IMPRESSION: There remains inflammatory change in the area of recent surgery at
the distal descending colon-sigmoid junction consistent with
diverticulitis. There is moderate surrounding mesenteric thickening/
stranding as well as a small amount of fluid tracking into the left
lateral conal fascia. Mesenteric inflammation tracks proximally
along the proximal to mid descending colon, not significantly
changed from recent prior study. Overall there has been very little
change in the degree of inflammation compared to recent prior study.
There is no demonstrable frank abscess or microperforation in this
area. The degree of inflammation in this area is similar to recent
prior study. There has been no evidence of resolution of
inflammation compared to the previous study.

There is stranding in the periumbilical region consistent with
recent surgery. No abscess or fluid collection seen in this area.

There is colonic diverticulosis at multiple sites in the colon. No
other areas are seen suggesting diverticulitis, however. Appendix
appears normal.

No renal or ureteral calculus.  No hydronephrosis.

Small left inguinal hernia containing only fat. Previous inguinal
hernia repair on the right noted.

## 2019-04-04 ENCOUNTER — Ambulatory Visit: Payer: Managed Care, Other (non HMO) | Attending: Internal Medicine

## 2019-04-04 DIAGNOSIS — Z20822 Contact with and (suspected) exposure to covid-19: Secondary | ICD-10-CM

## 2019-04-05 LAB — NOVEL CORONAVIRUS, NAA: SARS-CoV-2, NAA: DETECTED — AB

## 2019-04-06 ENCOUNTER — Telehealth: Payer: Self-pay

## 2019-04-06 NOTE — Telephone Encounter (Signed)
Pt.'s wife calling to verify home safety and quarantine requirements.Verbalizes understanding.

## 2019-04-13 ENCOUNTER — Telehealth: Payer: Self-pay

## 2019-04-13 NOTE — Telephone Encounter (Signed)
Will send information to [facility] St[facility] to fax

## 2022-08-21 ENCOUNTER — Ambulatory Visit: Payer: Managed Care, Other (non HMO) | Admitting: Urology

## 2022-09-18 ENCOUNTER — Ambulatory Visit: Payer: Managed Care, Other (non HMO) | Admitting: Urology

## 2023-03-27 ENCOUNTER — Other Ambulatory Visit: Payer: Self-pay

## 2023-03-27 ENCOUNTER — Emergency Department
Admission: EM | Admit: 2023-03-27 | Discharge: 2023-03-28 | Disposition: A | Discharge status: Home / Self Care | Payer: BC Managed Care – PPO | Attending: Emergency Medicine | Admitting: Emergency Medicine

## 2023-03-27 DIAGNOSIS — I10 Essential (primary) hypertension: Secondary | ICD-10-CM | POA: Insufficient documentation

## 2023-03-27 DIAGNOSIS — Z79899 Other long term (current) drug therapy: Secondary | ICD-10-CM | POA: Insufficient documentation

## 2023-03-27 DIAGNOSIS — M5416 Radiculopathy, lumbar region: Secondary | ICD-10-CM | POA: Diagnosis not present

## 2023-03-27 DIAGNOSIS — M549 Dorsalgia, unspecified: Secondary | ICD-10-CM | POA: Diagnosis present

## 2023-03-27 MED ORDER — PREDNISONE 20 MG PO TABS
60.0000 mg | ORAL_TABLET | Freq: Once | ORAL | Status: AC
  Administered 2023-03-28: 60 mg via ORAL
  Filled 2023-03-27: qty 3

## 2023-03-27 MED ORDER — PREDNISONE 10 MG (21) PO TBPK: ORAL_TABLET | ORAL | 0 refills | Status: AC

## 2023-03-27 MED ORDER — LIDOCAINE 5 % EX PTCH
1.0000 | MEDICATED_PATCH | Freq: Once | CUTANEOUS | Status: DC
  Administered 2023-03-28: 1 via TRANSDERMAL
  Filled 2023-03-27: qty 1

## 2023-03-27 MED ORDER — METHOCARBAMOL 500 MG PO TABS
500.0000 mg | ORAL_TABLET | Freq: Three times a day (TID) | ORAL | 0 refills | Status: AC | PRN
Start: 2023-03-27 — End: ?

## 2023-03-27 MED ORDER — IBUPROFEN 800 MG PO TABS
800.0000 mg | ORAL_TABLET | Freq: Once | ORAL | Status: AC
  Administered 2023-03-28: 800 mg via ORAL
  Filled 2023-03-27: qty 1

## 2023-03-27 MED ORDER — METHOCARBAMOL 500 MG PO TABS
500.0000 mg | ORAL_TABLET | Freq: Once | ORAL | Status: AC
Start: 2023-03-28 — End: 2023-03-28
  Administered 2023-03-28: 500 mg via ORAL
  Filled 2023-03-27: qty 1

## 2023-03-27 MED ORDER — LIDOCAINE 5 % EX PTCH: 1.0000 | MEDICATED_PATCH | Freq: Two times a day (BID) | CUTANEOUS | 0 refills | Status: AC

## 2023-03-27 MED ORDER — IBUPROFEN 800 MG PO TABS
800.0000 mg | ORAL_TABLET | Freq: Three times a day (TID) | ORAL | 0 refills | Status: AC | PRN
Start: 2023-03-27 — End: ?

## 2023-03-27 NOTE — Discharge Instructions (Addendum)
You may alternate Tylenol 1000 mg every 6 hours as needed for pain, fever and Ibuprofen 800 mg every 6-8 hours as needed for pain, fever.  Please take Ibuprofen with food.  Do not take more than 4000 mg of Tylenol (acetaminophen) in a 24 hour period.  I recommend continued stretching, gentle massage, alternating heat and ice to this area for discomfort.

## 2023-03-27 NOTE — ED Triage Notes (Signed)
Pt presents to ER with c/o lower back/left leg pain that has been ongoing x1 month, and states that it has been getting worse, esp when walking or working all day.  States pain shoots down left leg.  Denies injuries or hx of sciatica.  Pt is otherwise ambulatory, and A&O x4 at this time.

## 2023-03-27 NOTE — ED Provider Triage Note (Signed)
Emergency Medicine Provider Triage Evaluation Note  Edward Harris , a 46 y.o. male  was evaluated in triage.  Pt complains of lower left back pain that radiates down the back of his left leg. He has had the pain for a month, thought it was a pulled muscle that he could ignore, but states it is now difficult for him to walk due to the pain and soreness.   Review of Systems  Positive: Back pain, leg pain Negative: Numbness, tingling in the leg  Physical Exam  There were no vitals taken for this visit. Gen:   Awake, no distress   Resp:  Normal effort  MSK:   Moves extremities without difficulty  Other:    Medical Decision Making  Medically screening exam initiated at 7:36 PM.  Appropriate orders placed.  Izzak Lucken was informed that the remainder of the evaluation will be completed by another provider, this initial triage assessment does not replace that evaluation, and the importance of remaining in the ED until their evaluation is complete.     Cameron Ali, PA-C 03/27/23 1937

## 2023-03-28 NOTE — ED Provider Notes (Signed)
[facility] Provider Note    Event Date/Time   First MD Initiated Contact with Patient 03/27/23 2329     (approximate)   History   Back Pain and Leg Pain   HPI  Edward Harris is a 47 y.o. male with history of hypertension, IBS who presents to the emergency department with complaints of pain in the left buttock that radiates down the left leg that has been ongoing for a month.  Denies any known injury.  No numbness, tingling, weakness, bowel or bladder incontinence, fever, previous back surgery.  Has tried ibuprofen at home with some relief.  Has not seen his PCP for this.   History provided by patient, significant other.    Past Medical History:  Diagnosis Date   Arthritis    ankles   Diverticulitis    Hypertension    IBS (irritable bowel syndrome)    Nephrolithiasis    Vertigo    no episodes in several years   Wears contact lenses     Past Surgical History:  Procedure Laterality Date   CARPAL TUNNEL RELEASE Left 06/26/2016   Procedure: left open carpal tunnel release;  Surgeon: Erin Sons, MD;  Location: [facility];  Service: Orthopedics;  Laterality: Left;   COLON RESECTION  10/30/2014   COLONOSCOPY WITH PROPOFOL N/A 10/03/2014   Procedure: COLONOSCOPY WITH PROPOFOL;  Surgeon: Midge Minium, MD;  Location: [facility];  Service: Endoscopy;  Laterality: N/A;   ESOPHAGOGASTRODUODENOSCOPY (EGD) WITH PROPOFOL N/A 10/03/2014   Procedure: ESOPHAGOGASTRODUODENOSCOPY (EGD) WITH PROPOFOL;  Surgeon: Midge Minium, MD;  Location: [facility];  Service: Endoscopy;  Laterality: N/A;   INGUINAL HERNIA REPAIR Right    KIDNEY STONE SURGERY     extraction/lithotripsy   LAPAROSCOPIC SIGMOID COLECTOMY N/A 10/19/2014   Procedure: LAPAROSCOPIC ASSISTED  SIGMOID COLECTOMY;  Surgeon: Ida Rogue, MD;  Location: [facility];  Service: General;  Laterality: N/A;    MEDICATIONS:  Prior to Admission medications   Medication Sig  Start Date End Date Taking? Authorizing Provider  ibuprofen (ADVIL) 800 MG tablet Take 1 tablet (800 mg total) by mouth every 8 (eight) hours as needed. 03/27/23  Yes Ovid Witman N, DO  lidocaine (LIDODERM) 5 % Place 1 patch onto the skin every 12 (twelve) hours. Remove & Discard patch within 12 hours or as directed by MD 03/27/23 03/26/24 Yes Makailah Slavick, Layla Maw, DO  methocarbamol (ROBAXIN) 500 MG tablet Take 1 tablet (500 mg total) by mouth every 8 (eight) hours as needed. 03/27/23  Yes Clotee Schlicker, Layla Maw, DO  predniSONE (STERAPRED UNI-PAK 21 TAB) 10 MG (21) TBPK tablet Take as directed 03/27/23  Yes Caci Orren N, DO  amLODipine (NORVASC) 2.5 MG tablet Take 2.5 mg by mouth daily.    [provider]  Linaclotide (LINZESS PO) Take by mouth as needed.    [provider]  losartan-hydrochlorothiazide (HYZAAR) 100-25 MG tablet Take 1 tablet by mouth daily. 03/04/15   Katha Hamming, MD  NORCO 5-325 MG tablet Take 1-2 tablets by mouth every 6 (six) hours as needed for moderate pain. MAXIMUM TOTAL ACETAMINOPHEN DOSE IS 4000 MG PER DAY 06/26/16   Erin Sons, MD    Physical Exam   Triage Vital Signs: ED Triage Vitals  Encounter Vitals Group     BP 03/27/23 1939 (!) 163/104     Systolic BP Percentile --      Diastolic BP Percentile --      Pulse Rate 03/27/23 1939 81  Resp 03/27/23 1939 14     Temp 03/27/23 1939 98.5 F (36.9 C)     Temp Source 03/27/23 1939 Oral     SpO2 03/27/23 1939 100 %     Weight 03/27/23 1937 240 lb (108.9 kg)     Height 03/27/23 1937 5\' 9"  (1.753 m)     Head Circumference --      Peak Flow --      Pain Score 03/27/23 1937 10     Pain Loc --      Pain Education --      Exclude from Growth Chart --     Most recent vital signs: Vitals:   03/27/23 1939 03/28/23 0018  BP: (!) 163/104 (!) 149/90  Pulse: 81 64  Resp: 14 18  Temp: 98.5 F (36.9 C) 98 F (36.7 C)  SpO2: 100% 96%    CONSTITUTIONAL: Alert, responds appropriately to  questions. Well-appearing; well-nourished HEAD: Normocephalic, atraumatic EYES: Conjunctivae clear, pupils appear equal, sclera nonicteric ENT: normal nose; moist mucous membranes NECK: Supple, normal ROM CARD: RRR; S1 and S2 appreciated RESP: Normal chest excursion without splinting or tachypnea; breath sounds clear and equal bilaterally; no wheezes, no rhonchi, no rales, no hypoxia or respiratory distress, speaking full sentences ABD/GI: Non-distended; soft, non-tender, no rebound, no guarding, no peritoneal signs BACK: The back appears normal, no midline spinal tenderness, step-off or deformity, tender to palpation over the left lower lumbar paraspinal muscles without overlying skin changes EXT: Normal ROM in all joints; no deformity noted, no edema SKIN: Normal color for age and race; warm; no rash on exposed skin NEURO: Moves all extremities equally, normal speech, normal sensation, no saddle anesthesia, 2+ deep tendon reflexes in bilateral lower extremities, normal gait PSYCH: The patient's mood and manner are appropriate.   ED Results / Procedures / Treatments   LABS: (all labs ordered are listed, but only abnormal results are displayed) Labs Reviewed - No data to display   EKG:  EKG Interpretation Date/Time:    Ventricular Rate:    PR Interval:    QRS Duration:    QT Interval:    QTC Calculation:   R Axis:      Text Interpretation:           RADIOLOGY: My personal review and interpretation of imaging:    I have personally reviewed all radiology reports.   No results found.   PROCEDURES:  Critical Care performed: No     Procedures    IMPRESSION / MDM / ASSESSMENT AND PLAN / ED COURSE  I reviewed the triage vital signs and the nursing notes.    Patient here with symptoms of lumbar radiculopathy, piriformis syndrome.     DIFFERENTIAL DIAGNOSIS (includes but not limited to):   Lumbar radiculopathy, piriformis syndrome, doubt cauda equina,  epidural abscess or hematoma, discitis or osteomyelitis, transverse myelitis, fracture   Patient's presentation is most consistent with acute complicated illness / injury requiring diagnostic workup.   PLAN: Will start patient on muscle relaxers and continue anti-inflammatories and start a steroid taper.  Recommended stretching, alternating ice and heat, massage.  He has a PCP for follow-up if no improvement with symptomatic relief.   MEDICATIONS GIVEN IN ED: Medications  lidocaine (LIDODERM) 5 % 1 patch (1 patch Transdermal Patch Applied 03/28/23 0036)  predniSONE (DELTASONE) tablet 60 mg (60 mg Oral Given 03/28/23 0037)  methocarbamol (ROBAXIN) tablet 500 mg (500 mg Oral Given 03/28/23 0037)  ibuprofen (ADVIL) tablet 800 mg (800 mg Oral Given  03/28/23 0036)     ED COURSE:  At this time, I do not feel there is any life-threatening condition present. I reviewed all nursing notes, vitals, pertinent previous records.  All lab and urine results, EKGs, imaging ordered have been independently reviewed and interpreted by myself.  I reviewed all available radiology reports from any imaging ordered this visit.  Based on my assessment, I feel the patient is safe to be discharged home without further emergent workup and can continue workup as an outpatient as needed. Discussed all findings, treatment plan as well as usual and customary return precautions.  They verbalize understanding and are comfortable with this plan.  Outpatient follow-up has been provided as needed.  All questions have been answered.    CONSULTS:  none   OUTSIDE RECORDS REVIEWED: Reviewed last internal medicine note on 07/16/2022.       FINAL CLINICAL IMPRESSION(S) / ED DIAGNOSES   Final diagnoses:  Lumbar radiculopathy     Rx / DC Orders   ED Discharge Orders          Ordered    predniSONE (STERAPRED UNI-PAK 21 TAB) 10 MG (21) TBPK tablet        03/27/23 2348    methocarbamol (ROBAXIN) 500 MG tablet  Every 8  hours PRN        03/27/23 2348    ibuprofen (ADVIL) 800 MG tablet  Every 8 hours PRN        03/27/23 2348    lidocaine (LIDODERM) 5 %  Every 12 hours        03/27/23 2349             Note:  This document was prepared using Dragon voice recognition software and may include unintentional dictation errors.   Shamar Kracke, Layla Maw, DO 03/28/23 [phone]
